# Patient Record
Sex: Female | Born: 1955
Health system: Southern US, Community
[De-identification: ages and names within clinical notes are randomized; demographics above are authoritative.]

## PROBLEM LIST (undated history)

## (undated) DIAGNOSIS — Z923 Personal history of irradiation: Secondary | ICD-10-CM

## (undated) DIAGNOSIS — C439 Malignant melanoma of skin, unspecified: Secondary | ICD-10-CM

## (undated) DIAGNOSIS — R519 Headache, unspecified: Secondary | ICD-10-CM

## (undated) DIAGNOSIS — Z8041 Family history of malignant neoplasm of ovary: Secondary | ICD-10-CM

## (undated) DIAGNOSIS — C50919 Malignant neoplasm of unspecified site of unspecified female breast: Secondary | ICD-10-CM

## (undated) DIAGNOSIS — Z803 Family history of malignant neoplasm of breast: Secondary | ICD-10-CM

## (undated) DIAGNOSIS — J45909 Unspecified asthma, uncomplicated: Secondary | ICD-10-CM

## (undated) DIAGNOSIS — R51 Headache: Secondary | ICD-10-CM

## (undated) HISTORY — DX: Family history of malignant neoplasm of breast: Z80.3

## (undated) HISTORY — DX: Malignant melanoma of skin, unspecified: C43.9

## (undated) HISTORY — PX: DILATION AND CURETTAGE OF UTERUS: SHX78

## (undated) HISTORY — DX: Family history of malignant neoplasm of ovary: Z80.41

## (undated) HISTORY — DX: Age-related osteoporosis without current pathological fracture: M81.0

---
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
## 1994-04-05 HISTORY — PX: ECTOPIC PREGNANCY SURGERY: SHX613

## 1997-05-06 ENCOUNTER — Encounter: Admission: RE | Admit: 1997-05-06 | Discharge: 1997-08-04 | Payer: Self-pay | Admitting: Obstetrics and Gynecology

## 1997-12-11 ENCOUNTER — Other Ambulatory Visit: Admission: RE | Admit: 1997-12-11 | Discharge: 1997-12-11 | Payer: Self-pay | Admitting: Obstetrics and Gynecology

## 1998-12-12 ENCOUNTER — Other Ambulatory Visit: Admission: RE | Admit: 1998-12-12 | Discharge: 1998-12-12 | Payer: Self-pay | Admitting: Obstetrics and Gynecology

## 2000-02-12 ENCOUNTER — Other Ambulatory Visit: Admission: RE | Admit: 2000-02-12 | Discharge: 2000-02-12 | Payer: Self-pay | Admitting: Obstetrics and Gynecology

## 2000-02-12 ENCOUNTER — Encounter: Admission: RE | Admit: 2000-02-12 | Discharge: 2000-02-12 | Payer: Self-pay | Admitting: *Deleted

## 2000-02-12 ENCOUNTER — Encounter: Payer: Self-pay | Admitting: *Deleted

## 2000-09-23 ENCOUNTER — Encounter: Payer: Self-pay | Admitting: *Deleted

## 2000-09-23 ENCOUNTER — Encounter: Admission: RE | Admit: 2000-09-23 | Discharge: 2000-09-23 | Payer: Self-pay | Admitting: *Deleted

## 2001-04-03 ENCOUNTER — Other Ambulatory Visit: Admission: RE | Admit: 2001-04-03 | Discharge: 2001-04-03 | Payer: Self-pay | Admitting: Obstetrics and Gynecology

## 2002-04-25 ENCOUNTER — Other Ambulatory Visit: Admission: RE | Admit: 2002-04-25 | Discharge: 2002-04-25 | Payer: Self-pay | Admitting: Obstetrics and Gynecology

## 2002-09-27 ENCOUNTER — Encounter: Payer: Self-pay | Admitting: Internal Medicine

## 2002-09-27 ENCOUNTER — Ambulatory Visit (HOSPITAL_COMMUNITY): Admission: RE | Admit: 2002-09-27 | Discharge: 2002-09-27 | Payer: Self-pay | Admitting: Internal Medicine

## 2002-10-01 ENCOUNTER — Encounter: Admission: RE | Admit: 2002-10-01 | Discharge: 2002-10-01 | Payer: Self-pay | Admitting: Internal Medicine

## 2002-10-01 ENCOUNTER — Encounter: Payer: Self-pay | Admitting: Internal Medicine

## 2002-11-27 ENCOUNTER — Encounter: Payer: Self-pay | Admitting: Obstetrics and Gynecology

## 2002-11-27 ENCOUNTER — Encounter: Admission: RE | Admit: 2002-11-27 | Discharge: 2002-11-27 | Payer: Self-pay | Admitting: Obstetrics and Gynecology

## 2003-02-01 ENCOUNTER — Encounter: Admission: RE | Admit: 2003-02-01 | Discharge: 2003-02-01 | Payer: Self-pay | Admitting: Internal Medicine

## 2003-03-13 ENCOUNTER — Encounter (HOSPITAL_COMMUNITY): Admission: RE | Admit: 2003-03-13 | Discharge: 2003-06-11 | Payer: Self-pay | Admitting: General Surgery

## 2003-05-03 ENCOUNTER — Other Ambulatory Visit: Admission: RE | Admit: 2003-05-03 | Discharge: 2003-05-03 | Payer: Self-pay | Admitting: Obstetrics and Gynecology

## 2003-12-11 ENCOUNTER — Encounter: Admission: RE | Admit: 2003-12-11 | Discharge: 2003-12-11 | Payer: Self-pay | Admitting: Obstetrics and Gynecology

## 2004-05-04 ENCOUNTER — Other Ambulatory Visit: Admission: RE | Admit: 2004-05-04 | Discharge: 2004-05-04 | Payer: Self-pay | Admitting: Obstetrics and Gynecology

## 2005-01-13 ENCOUNTER — Encounter: Admission: RE | Admit: 2005-01-13 | Discharge: 2005-01-13 | Payer: Self-pay | Admitting: Obstetrics and Gynecology

## 2005-01-27 ENCOUNTER — Emergency Department (HOSPITAL_COMMUNITY): Admission: EM | Admit: 2005-01-27 | Discharge: 2005-01-28 | Payer: Self-pay | Admitting: Emergency Medicine

## 2005-05-19 ENCOUNTER — Other Ambulatory Visit: Admission: RE | Admit: 2005-05-19 | Discharge: 2005-05-19 | Payer: Self-pay | Admitting: Obstetrics and Gynecology

## 2006-03-11 ENCOUNTER — Encounter: Admission: RE | Admit: 2006-03-11 | Discharge: 2006-03-11 | Payer: Self-pay | Admitting: Obstetrics and Gynecology

## 2006-03-23 ENCOUNTER — Encounter: Admission: RE | Admit: 2006-03-23 | Discharge: 2006-03-23 | Payer: Self-pay | Admitting: Obstetrics and Gynecology

## 2007-04-17 ENCOUNTER — Encounter: Admission: RE | Admit: 2007-04-17 | Discharge: 2007-04-17 | Payer: Self-pay | Admitting: Obstetrics and Gynecology

## 2008-04-18 ENCOUNTER — Encounter: Admission: RE | Admit: 2008-04-18 | Discharge: 2008-04-18 | Payer: Self-pay | Admitting: Obstetrics and Gynecology

## 2009-07-08 ENCOUNTER — Ambulatory Visit: Payer: Self-pay | Admitting: Genetic Counselor

## 2010-04-26 ENCOUNTER — Encounter: Payer: Self-pay | Admitting: Obstetrics and Gynecology

## 2011-05-14 ENCOUNTER — Encounter (HOSPITAL_COMMUNITY): Payer: Self-pay

## 2011-05-14 ENCOUNTER — Emergency Department (INDEPENDENT_AMBULATORY_CARE_PROVIDER_SITE_OTHER): Payer: PRIVATE HEALTH INSURANCE

## 2011-05-14 ENCOUNTER — Emergency Department (HOSPITAL_COMMUNITY)
Admission: EM | Admit: 2011-05-14 | Discharge: 2011-05-14 | Disposition: A | Discharge status: Home / Self Care | Payer: PRIVATE HEALTH INSURANCE | Source: Home / Self Care | Attending: Emergency Medicine | Admitting: Emergency Medicine

## 2011-05-14 DIAGNOSIS — S6000XA Contusion of unspecified finger without damage to nail, initial encounter: Secondary | ICD-10-CM

## 2011-05-14 MED ORDER — HYDROCODONE-ACETAMINOPHEN 5-500 MG PO TABS: 1.0000 | ORAL_TABLET | Freq: Four times a day (QID) | ORAL | Status: AC | PRN

## 2011-05-14 MED ORDER — MELOXICAM 7.5 MG PO TABS: 7.5000 mg | ORAL_TABLET | Freq: Every day | ORAL | Status: AC

## 2011-05-14 NOTE — ED Provider Notes (Signed)
History     CSN: [id number]  Arrival date & time 05/14/11  4098   First MD Initiated Contact with Patient 05/14/11 1059      Chief Complaint  Patient presents with  . Hand Injury    (Consider location/radiation/quality/duration/timing/severity/associated sxs/prior treatment) HPI Comments: Julie Mack presented today to the urgent care Center complaining of right fifth finger pain more localized pain to the fifth digit PIP. Describes that she fell about 2 and half weeks ago hitting her right hand on a piece of furniture and has been expressing pain since then with swelling of her right fifth finger. Described the pain has continued but swelling has come down and was much worse.  She denies any paresthesias or numbness and she's able to move her finger "all right" but it hurts when I do so". Patient have not had any medical diagnostic workup for this recent fall.  Denies any other symptoms.  Patient is a 56 y.o. female presenting with hand injury. The history is provided by the patient.  Hand Injury  The incident occurred more than 1 week ago. The injury mechanism was a fall. Pertinent negatives include no fever.    History reviewed. No pertinent past medical history.  Past Surgical History  Procedure Date  . Tonsillectomy   . Ectopic pregnancy surgery     No family history on file.  History  Substance Use Topics  . Smoking status: Never Smoker   . Smokeless tobacco: Not on file  . Alcohol Use: Yes    OB History    Grav Para Term Preterm Abortions TAB SAB Ect Mult Living                  Review of Systems  Constitutional: Negative for fever.  Skin: Negative for color change and wound.  Neurological: Negative for weakness and numbness.    Allergies  Review of patient's allergies indicates no known allergies.  Home Medications   Current Outpatient Rx  Name Route Sig Dispense Refill  . NORGESTIMATE-ETH ESTRADIOL 0.25-35 MG-MCG PO TABS Oral Take 1 tablet by mouth  daily.    Marland Kitchen HYDROCODONE-ACETAMINOPHEN 5-500 MG PO TABS Oral Take 1-2 tablets by mouth every 6 (six) hours as needed for pain. 15 tablet 0  . MELOXICAM 7.5 MG PO TABS Oral Take 1 tablet (7.5 mg total) by mouth daily. 14 tablet 0    BP 119/77  Pulse 75  Temp(Src) 98.4 F (36.9 C) (Oral)  Resp 18  SpO2 99%  LMP 05/04/2011  Physical Exam  Constitutional: She appears well-developed and well-nourished. No distress.  HENT:  Head: Atraumatic.  Eyes: Right eye exhibits no discharge. Left eye exhibits no discharge. No scleral icterus.  Musculoskeletal: She exhibits tenderness. She exhibits no edema.       Right hand: She exhibits tenderness, bony tenderness and swelling. She exhibits normal range of motion, normal two-point discrimination, normal capillary refill and no deformity. normal sensation noted. Normal strength noted. She exhibits no finger abduction, no thumb/finger opposition and no wrist extension trouble.       Hands: Neurological: She is alert.  Skin: No rash noted. No erythema.    ED Course  Procedures (including critical care time)  Labs Reviewed - No data to display Dg Hand Complete Right  05/14/2011  *RADIOLOGY REPORT*  Clinical Data: Fall 2 weeks prior  RIGHT HAND - COMPLETE 3+ VIEW  Comparison: None.  Findings: No evidence of fracture of the carpals or metacarpals. Radiocarpal joint is intact.  IMPRESSION:  No evidence of hand fracture.  Original Report Authenticated By: Genevive Bi, M.D.     1. Contusion of finger       MDM  Range of motion and sensorially vascular is her x-rays were negative for dislocations fractures or localize infection.        Julie Molly, MD 05/14/11 1322

## 2011-05-14 NOTE — ED Notes (Signed)
Pt states she fell 2 1/2 weeks ago and hit her rt hand on a piece of furniture.  conitnues to have pain and swelling to rt 5th finger.  Full ROM intact.

## 2013-04-05 HISTORY — PX: ANKLE FRACTURE SURGERY: SHX122

## 2014-10-15 ENCOUNTER — Encounter: Payer: Self-pay | Admitting: Genetic Counselor

## 2014-12-26 ENCOUNTER — Other Ambulatory Visit: Payer: Self-pay | Admitting: Internal Medicine

## 2014-12-26 ENCOUNTER — Ambulatory Visit
Admission: RE | Admit: 2014-12-26 | Discharge: 2014-12-26 | Disposition: A | Discharge status: Home / Self Care | Payer: PRIVATE HEALTH INSURANCE | Source: Ambulatory Visit | Attending: Internal Medicine | Admitting: Internal Medicine

## 2014-12-26 DIAGNOSIS — G500 Trigeminal neuralgia: Secondary | ICD-10-CM

## 2014-12-26 MED ORDER — GADOBENATE DIMEGLUMINE 529 MG/ML IV SOLN
15.0000 mL | Freq: Once | INTRAVENOUS | Status: AC | PRN
  Administered 2014-12-26: 15 mL via INTRAVENOUS

## 2015-01-07 ENCOUNTER — Encounter: Payer: Self-pay | Admitting: *Deleted

## 2015-01-08 ENCOUNTER — Encounter: Payer: Self-pay | Admitting: Diagnostic Neuroimaging

## 2015-01-08 ENCOUNTER — Ambulatory Visit (INDEPENDENT_AMBULATORY_CARE_PROVIDER_SITE_OTHER): Payer: PRIVATE HEALTH INSURANCE | Admitting: Diagnostic Neuroimaging

## 2015-01-08 VITALS — BP 115/64 | HR 82 | Ht 64.0 in | Wt 183.0 lb

## 2015-01-08 DIAGNOSIS — G521 Disorders of glossopharyngeal nerve: Secondary | ICD-10-CM

## 2015-01-08 DIAGNOSIS — G500 Trigeminal neuralgia: Secondary | ICD-10-CM

## 2015-01-08 NOTE — Progress Notes (Signed)
[facility]  PATIENT: Julie Mack DOB: [birth year]  REFERRING CLINICIAN: Brigitte Pulse  HISTORY FROM: patient  REASON FOR VISIT: new consult    HISTORICAL  CHIEF COMPLAINT:  Chief Complaint  Patient presents with  . Trigeminal neuralgia, possible    rm 6, New Patient    HISTORY OF PRESENT ILLNESS:   59 year old right-handed female here for evaluation of possible trigeminal neuralgia.  Patient has long history of intermittent brief "pink" sensations in her right parietal and right facial region. This started sometime around college, sometimes associated with cold or sinus infections. This was a occurring every 1-2 years and she did not seek medical attention for these attacks. Over the years symptoms would occur sporadically without specific triggering factors. Over past 4 years patient has had 3-4 serious attacks with severe pain, lasting 3-4 seconds at a time. Symptoms would occur every 20 minutes, increasing frequency up to 2-3 minutes between attacks. Overall attack would last 4-7 days. With more recent attacks patient reported increasing pain in her throat, jaw, ear.  Interestingly patient was prescribed valacyclovir for the last 2 attacks and within 24 hours of taking medication the symptoms seem to resolve. Valacyclovir was started for empiric treatment of shingles, even though patient never had vesicular rash or other outbreaks.   Patient denies any history of traumas, serious infections, or major dental procedures or complications.   REVIEW OF SYSTEMS: Full 14 system review of systems performed and notable only for only as per history of present illness. No headaches.   ALLERGIES: Allergies  Allergen Reactions  . Gluten Meal Nausea Only    HOME MEDICATIONS: Outpatient Prescriptions Prior to Visit  Medication Sig Dispense Refill  . norgestimate-ethinyl estradiol (SPRINTEC 28) 0.25-35 MG-MCG tablet Take 1 tablet by mouth daily.     No  facility-administered medications prior to visit.    PAST MEDICAL HISTORY: Past Medical History  Diagnosis Date  . Osteoporosis   . Melanoma ([place])     in situ    PAST SURGICAL HISTORY: Past Surgical History  Procedure Laterality Date  . Tonsillectomy  1966  . Ectopic pregnancy surgery  1996  . Eye surgery  1967    cyst removed  . Ankle fracture surgery Right 2015    FAMILY HISTORY: Family History  Problem Relation Age of Onset  . Heart attack Mother   . Cancer - Lung Father   . Cancer Sister     breast  . Cancer Maternal Grandmother     ovarian  . Stroke Maternal Grandfather   . Stroke Paternal Grandfather     SOCIAL HISTORY:  Social History   Social History  . Marital Status: Married    Spouse Name: Simona Huh  . Number of Children: 2  . Years of Education: 16   Occupational History  .      [place], [place]   Social History Main Topics  . Smoking status: Never Smoker   . Smokeless tobacco: Not on file  . Alcohol Use: 0.0 oz/week    0 Standard drinks or equivalent per week     Comment: 2 glasses wine a day  . Drug Use: No  . Sexual Activity: Not on file   Other Topics Concern  . Not on file   Social History Narrative   Lives at home with husband   decaff only     PHYSICAL EXAM  GENERAL EXAM/CONSTITUTIONAL: Vitals:  Filed Vitals:   01/08/15 0839  BP: 115/64  Pulse: 82  Height: 5\' 4"  (1.626 m)  Weight: 183 lb (83.008 kg)     Body mass index is 31.4 kg/(m^2).  Visual Acuity Screening   Right eye Left eye Both eyes  Without correction:     With correction: 20/30 20/30      Patient is in no distress; well developed, nourished and groomed; neck is supple  CARDIOVASCULAR:  Examination of carotid arteries is normal; no carotid bruits  Regular rate and rhythm, no murmurs  Examination of peripheral vascular system by observation and palpation is normal  EYES:  Ophthalmoscopic exam of optic discs and posterior  segments is normal; no papilledema or hemorrhages  MUSCULOSKELETAL:  Gait, strength, tone, movements noted in Neurologic exam below  NEUROLOGIC: MENTAL STATUS:  No flowsheet data found.  awake, alert, oriented to person, place and time  recent and remote memory intact  normal attention and concentration  language fluent, comprehension intact, naming intact,   fund of knowledge appropriate  CRANIAL NERVE:   2nd - no papilledema on fundoscopic exam  2nd, 3rd, 4th, 6th - pupils equal and reactive to light, visual fields full to confrontation, extraocular muscles intact, no nystagmus  5th - facial sensation symmetric  7th - facial strength symmetric  8th - hearing intact  9th - palate elevates symmetrically, uvula midline  11th - shoulder shrug symmetric  12th - tongue protrusion midline  MOTOR:   normal bulk and tone, full strength in the BUE, BLE  SENSORY:   normal and symmetric to light touch, temperature, vibration  COORDINATION:   finger-nose-finger, fine finger movements normal  REFLEXES:   deep tendon reflexes present and symmetric  GAIT/STATION:   narrow based gait; able to walk tandem; romberg is negative    DIAGNOSTIC DATA (LABS, IMAGING, TESTING) - I reviewed patient records, labs, notes, testing and imaging myself where available.  No results found for: WBC, HGB, HCT, MCV, PLT No results found for: NA, K, CL, CO2, GLUCOSE, BUN, CREATININE, CALCIUM, PROT, ALBUMIN, AST, ALT, ALKPHOS, BILITOT, GFRNONAA, GFRAA No results found for: CHOL, HDL, LDLCALC, LDLDIRECT, TRIG, CHOLHDL No results found for: HGBA1C No results found for: VITAMINB12 No results found for: TSH   12/26/14 MRI brain [I reviewed images myself and agree with interpretation. -VRP]  - Essentially normal MRI appearance of the brain; there is a 2.3 cm arachnoid cyst in the right middle cranial fossa but significance is doubtful. - No brainstem or trigeminal abnormality is  identified.    ASSESSMENT AND PLAN  59 y.o. year old female here with intermittent episodes, paroxysmal attacks of pain in the right parietal, right facial, right auricular and right pharyngeal regions. Findings suspicious for trigeminal and glossopharyngeal neuralgias. Neurologic examination and MRI are unremarkable. Most likely represents idiopathic versus viral/inflammatory etiologies. If symptoms increase in severity or frequency may consider medical treatment with carbamazepine or gabapentin. For now we will monitor symptoms. Patient's questions were addressed. She will follow-up with me as needed or sooner if symptoms recur.   Dx: (idiopathic vs viral/inflammatory)  Trigeminal neuralgia of right side of face  Glossopharyngeal neuralgia    PLAN: - monitor symptoms  Return if symptoms worsen or fail to improve.    Penni Bombard, MD 40/12/8117, 1:47 AM Certified in Neurology, Neurophysiology and Neuroimaging  [facility] [address], [place], [place] [address] [phone]

## 2015-01-08 NOTE — Patient Instructions (Signed)
Thank you for coming to see [place] at [facility]. I hope we have been able to provide you high quality care today.  You may receive a patient satisfaction survey over the next few weeks. We would appreciate your feedback and comments so that we may continue to improve ourselves and the health of our patients.  - monitor symptoms   ~~~~~~~~~~~~~~~~~~~~~~~~~~~~~~~~~~~~~~~~~~~~~~~~~~~~~~~~~~~~~~~~~  DR. PENUMALLI'S GUIDE TO HAPPY AND HEALTHY LIVING These are some of my general health and wellness recommendations. Some of them may apply to you better than others. Please use common sense as you try these suggestions and feel free to ask me any questions.   ACTIVITY/FITNESS Mental, social, emotional and physical stimulation are very important for brain and body health. Try learning a new activity (arts, music, language, sports, games).  Keep moving your body to the best of your abilities. You can do this at home, inside or outside, the park, community center, gym or anywhere you like. Consider a physical therapist or personal trainer to get started. Consider the app Sworkit. Fitness trackers such as smart-watches, smart-phones or Fitbits can help as well.   NUTRITION Eat more plants: colorful vegetables, nuts, seeds and berries.  Eat less sugar, salt, preservatives and processed foods.  Avoid toxins such as cigarettes and alcohol.  Drink water when you are thirsty. Warm water with a slice of lemon is an excellent morning drink to start the day.  Consider these websites for more information [employer] Nutrition [employer] (https://www.henry-hernandez.biz/) [employer] Nutrition (WindowBlog.ch)   RELAXATION Consider practicing mindfulness meditation or other relaxation techniques such as deep breathing, prayer, yoga, tai chi, massage. See website mindful.org or the apps Headspace or Calm to help get started.   SLEEP Try to get at least 7-8+  hours sleep per day. Regular exercise and reduced caffeine will help you sleep better. Practice good sleep hygeine techniques. See website sleep.org for more information.

## 2015-04-06 DIAGNOSIS — Z923 Personal history of irradiation: Secondary | ICD-10-CM

## 2015-04-06 HISTORY — DX: Personal history of irradiation: Z92.3

## 2015-12-19 ENCOUNTER — Other Ambulatory Visit: Payer: Self-pay | Admitting: Obstetrics and Gynecology

## 2015-12-19 DIAGNOSIS — Z9189 Other specified personal risk factors, not elsewhere classified: Secondary | ICD-10-CM

## 2015-12-28 ENCOUNTER — Ambulatory Visit
Admission: RE | Admit: 2015-12-28 | Discharge: 2015-12-28 | Disposition: A | Discharge status: Home / Self Care | Payer: PRIVATE HEALTH INSURANCE | Source: Ambulatory Visit | Attending: Obstetrics and Gynecology | Admitting: Obstetrics and Gynecology

## 2015-12-28 DIAGNOSIS — Z9189 Other specified personal risk factors, not elsewhere classified: Secondary | ICD-10-CM

## 2015-12-28 MED ORDER — GADOBENATE DIMEGLUMINE 529 MG/ML IV SOLN
15.0000 mL | Freq: Once | INTRAVENOUS | Status: AC | PRN
  Administered 2015-12-28: 15 mL via INTRAVENOUS

## 2015-12-31 ENCOUNTER — Other Ambulatory Visit: Payer: Self-pay | Admitting: Obstetrics and Gynecology

## 2015-12-31 DIAGNOSIS — R9389 Abnormal findings on diagnostic imaging of other specified body structures: Secondary | ICD-10-CM

## 2016-01-04 DIAGNOSIS — C50919 Malignant neoplasm of unspecified site of unspecified female breast: Secondary | ICD-10-CM

## 2016-01-04 HISTORY — DX: Malignant neoplasm of unspecified site of unspecified female breast: C50.919

## 2016-01-05 ENCOUNTER — Ambulatory Visit
Admission: RE | Admit: 2016-01-05 | Discharge: 2016-01-05 | Disposition: A | Discharge status: Home / Self Care | Payer: PRIVATE HEALTH INSURANCE | Source: Ambulatory Visit | Attending: Obstetrics and Gynecology | Admitting: Obstetrics and Gynecology

## 2016-01-05 ENCOUNTER — Other Ambulatory Visit: Payer: Self-pay | Admitting: Obstetrics and Gynecology

## 2016-01-05 DIAGNOSIS — R9389 Abnormal findings on diagnostic imaging of other specified body structures: Secondary | ICD-10-CM

## 2016-01-05 DIAGNOSIS — C50919 Malignant neoplasm of unspecified site of unspecified female breast: Secondary | ICD-10-CM | POA: Insufficient documentation

## 2016-01-05 MED ORDER — GADOBENATE DIMEGLUMINE 529 MG/ML IV SOLN
14.0000 mL | Freq: Once | INTRAVENOUS | Status: AC | PRN
  Administered 2016-01-05: 14 mL via INTRAVENOUS

## 2016-01-14 ENCOUNTER — Encounter: Payer: Self-pay | Admitting: Genetic Counselor

## 2016-01-15 ENCOUNTER — Other Ambulatory Visit: Payer: Self-pay | Admitting: General Surgery

## 2016-01-15 DIAGNOSIS — C50411 Malignant neoplasm of upper-outer quadrant of right female breast: Secondary | ICD-10-CM

## 2016-01-15 DIAGNOSIS — Z170 Estrogen receptor positive status [ER+]: Principal | ICD-10-CM

## 2016-01-16 ENCOUNTER — Telehealth: Payer: Self-pay | Admitting: Oncology

## 2016-01-16 ENCOUNTER — Encounter: Payer: Self-pay | Admitting: Genetic Counselor

## 2016-01-16 ENCOUNTER — Encounter: Payer: Self-pay | Admitting: Oncology

## 2016-01-16 NOTE — Telephone Encounter (Signed)
Pt returned my call to schedule appt w/Julie Mack. Appt scheduled for 10/23 at 430pm. Pt aware to arrive 30 minutes early. Demographics verified. Letter mailed to the patient.

## 2016-01-23 ENCOUNTER — Other Ambulatory Visit: Payer: Self-pay | Admitting: *Deleted

## 2016-01-23 ENCOUNTER — Other Ambulatory Visit: Payer: Self-pay | Admitting: General Surgery

## 2016-01-23 DIAGNOSIS — Z170 Estrogen receptor positive status [ER+]: Principal | ICD-10-CM

## 2016-01-23 DIAGNOSIS — C50411 Malignant neoplasm of upper-outer quadrant of right female breast: Secondary | ICD-10-CM

## 2016-01-23 DIAGNOSIS — C50919 Malignant neoplasm of unspecified site of unspecified female breast: Secondary | ICD-10-CM

## 2016-01-26 ENCOUNTER — Other Ambulatory Visit (HOSPITAL_BASED_OUTPATIENT_CLINIC_OR_DEPARTMENT_OTHER): Payer: PRIVATE HEALTH INSURANCE

## 2016-01-26 ENCOUNTER — Ambulatory Visit (HOSPITAL_BASED_OUTPATIENT_CLINIC_OR_DEPARTMENT_OTHER): Payer: PRIVATE HEALTH INSURANCE | Admitting: Oncology

## 2016-01-26 DIAGNOSIS — C50411 Malignant neoplasm of upper-outer quadrant of right female breast: Secondary | ICD-10-CM | POA: Diagnosis not present

## 2016-01-26 DIAGNOSIS — C50919 Malignant neoplasm of unspecified site of unspecified female breast: Secondary | ICD-10-CM

## 2016-01-26 DIAGNOSIS — Z170 Estrogen receptor positive status [ER+]: Secondary | ICD-10-CM | POA: Diagnosis not present

## 2016-01-26 DIAGNOSIS — Z801 Family history of malignant neoplasm of trachea, bronchus and lung: Secondary | ICD-10-CM | POA: Diagnosis not present

## 2016-01-26 DIAGNOSIS — Z803 Family history of malignant neoplasm of breast: Secondary | ICD-10-CM | POA: Diagnosis not present

## 2016-01-26 DIAGNOSIS — Z8041 Family history of malignant neoplasm of ovary: Secondary | ICD-10-CM

## 2016-01-26 LAB — CBC WITH DIFFERENTIAL/PLATELET
BASO%: 0.8 % (ref 0.0–2.0)
Basophils Absolute: 0.1 10*3/uL (ref 0.0–0.1)
EOS ABS: 0.2 10*3/uL (ref 0.0–0.5)
EOS%: 3.2 % (ref 0.0–7.0)
HEMATOCRIT: 41.5 % (ref 34.8–46.6)
HEMOGLOBIN: 13.7 g/dL (ref 11.6–15.9)
LYMPH#: 1.7 10*3/uL (ref 0.9–3.3)
LYMPH%: 23.6 % (ref 14.0–49.7)
MCH: 29.3 pg (ref 25.1–34.0)
MCHC: 33.1 g/dL (ref 31.5–36.0)
MCV: 88.5 fL (ref 79.5–101.0)
MONO#: 0.6 10*3/uL (ref 0.1–0.9)
MONO%: 8.3 % (ref 0.0–14.0)
NEUT%: 64.1 % (ref 38.4–76.8)
NEUTROS ABS: 4.5 10*3/uL (ref 1.5–6.5)
Platelets: 257 10*3/uL (ref 145–400)
RBC: 4.69 10*6/uL (ref 3.70–5.45)
RDW: 12.8 % (ref 11.2–14.5)
WBC: 7 10*3/uL (ref 3.9–10.3)

## 2016-01-26 LAB — COMPREHENSIVE METABOLIC PANEL
ALBUMIN: 3.9 g/dL (ref 3.5–5.0)
ALK PHOS: 94 U/L (ref 40–150)
ALT: 16 U/L (ref 0–55)
AST: 16 U/L (ref 5–34)
Anion Gap: 9 mEq/L (ref 3–11)
BILIRUBIN TOTAL: 0.36 mg/dL (ref 0.20–1.20)
BUN: 13.7 mg/dL (ref 7.0–26.0)
CO2: 28 mEq/L (ref 22–29)
Calcium: 9.8 mg/dL (ref 8.4–10.4)
Chloride: 104 mEq/L (ref 98–109)
Creatinine: 0.7 mg/dL (ref 0.6–1.1)
GLUCOSE: 106 mg/dL (ref 70–140)
Potassium: 4.7 mEq/L (ref 3.5–5.1)
SODIUM: 141 meq/L (ref 136–145)
TOTAL PROTEIN: 7.6 g/dL (ref 6.4–8.3)

## 2016-01-26 MED ORDER — TAMOXIFEN CITRATE 20 MG PO TABS: 20.0000 mg | ORAL_TABLET | Freq: Every day | ORAL | 12 refills | Status: AC

## 2016-01-26 NOTE — Progress Notes (Signed)
[place]  [phone] [phone]     ID: Julie Mack DOB: [birth year]  MR#: [id number]  DGL#:[id number]  Patient Care Team: Marton Redwood, MD as PCP - General (Internal Medicine) Fanny Skates, MD as Consulting Physician (General Surgery) Eppie Gibson, MD as Attending Physician (Radiation Oncology) Rolm Bookbinder, MD as Consulting Physician (Dermatology) Ronald Lobo, MD as Consulting Physician (Gastroenterology) Chauncey Cruel, MD OTHER MD:  CHIEF COMPLAINT: Estrogen receptor positive breast cancer  CURRENT TREATMENT: Awaiting definitive surgery   BREAST CANCER HISTORY: Julie Mack undergoes high risk breast cancer screening because of a significant family history and on 12/28/2015 bilateral breast MRI found a 6 mm enhancing mass in the upper outer quadrant of the right breast there were no other abnormalities including no left breast findings and no adenopathy. Biopsy of the right breast mass in question documented invasive ductal carcinoma, grade 1, estrogen receptor 100% positive, with strong staining intensity, progesterone receptor 30% positive, with moderate staining intensity, with an MIB-1 of 10%, and no HER-2 amplification, the signals ratio being 0.72 and the number per cell 1.15. A second suspicious area also in the upper-outer quadrant was biopsied at the same time and showed only usual ductal hyperplasia.  Her subsequent history is as detailed below.   INTERVAL HISTORY: Julie Mack was evaluated in the breast clinic 01/26/2016 accompanied by her husband Julie Mack. Her case was also presented in the multidisciplinary breast cancer conference 01/14/2016. At that time a preliminary plan was proposed: Genetics testing, breast conserving surgery with sentinel lymph node sampling, and consideration of Oncotype  REVIEW OF SYSTEMS: There were no specific symptoms leading to the original mammogram, which was routinely scheduled. The patient denies  unusual headaches, visual changes, nausea, vomiting, stiff neck, dizziness, or gait imbalance. There has been no cough, phlegm production, or pleurisy, no chest pain or pressure, and no change in bowel or bladder habits. The patient denies fever, rash, bleeding, unexplained fatigue or unexplained weight loss. [name] chiefly by hiking although she does have some plantar fasciitis problems. A detailed review of systems was otherwise entirely negative.   PAST MEDICAL HISTORY: Past Medical History:  Diagnosis Date  . Melanoma ([place])    in situ  . Osteoporosis     PAST SURGICAL HISTORY: Past Surgical History:  Procedure Laterality Date  . ANKLE FRACTURE SURGERY Right 2015  . [place]  . EYE SURGERY  1967   cyst removed  . TONSILLECTOMY  1966    FAMILY HISTORY Family History  Problem Relation Age of Onset  . Heart attack Mother   . Cancer - Lung Father   . Cancer Sister     breast  . Cancer Maternal Grandmother     ovarian  . Stroke Maternal Grandfather   . Stroke Paternal Grandfather   As of October 2017 Julie Mack's parents are still living, in their early 72s. Her father had lung cancer diagnosed at age 23 (essentially a nonsmoker); a maternal grandmother had ovarian cancer at age 75 the patient has 2 brothers, 2 sisters, with one sister diagnosed with breast cancer at age 83("and doing fine").  GYNECOLOGIC HISTORY:  Patient's last menstrual period was 05/04/2011. Energy age 26, first live birth age 67, menopause age 47, on vaginal estrogen cream until October 2017. She used oral contraceptives for more than 30 years with no complications  SOCIAL HISTORY:  Julie Mack and hotel executives. Their son Julie Mack is Agricultural consultant in [place]. Their daughter Julie Mack is interested  in literature in [place].    ADVANCED DIRECTIVES: In place   HEALTH MAINTENANCE: Social History  Substance Use Topics  . Smoking status: Never Smoker    . Smokeless tobacco: Not on file  . Alcohol use 0.0 oz/week     Comment: 2 glasses wine a day     Colonoscopy:  PAP:  Bone density:   Allergies  Allergen Reactions  . Gluten Meal Nausea Only    Current Outpatient Prescriptions  Medication Sig Dispense Refill  . betamethasone valerate ointment (VALISONE) 0.1 %     . fluorouracil (EFUDEX) 5 % cream Apply topically as needed.    . Multiple Vitamins-Minerals (MULTI COMPLETE PO) Take by mouth.    . valACYclovir (VALTREX) 1000 MG tablet Take 1,000 mg by mouth 2 (two) times daily. 01/08/15 as needed for pain    . tamoxifen (NOLVADEX) 20 MG tablet Take 1 tablet (20 mg total) by mouth daily. 90 tablet 12   No current facility-administered medications for this visit.     OBJECTIVE: Middle-aged white woman in no acute distress Vitals:   01/26/16 1609  BP: 127/73  Pulse: 73  Resp: 18  Temp: 97.4 F (36.3 C)     Body mass index is 27.6 kg/m.    ECOG FS:0 - Asymptomatic  Ocular: Sclerae unicteric, pupils equal, round and reactive to light Ear-nose-throat: Oropharynx clear and moist Lymphatic: No cervical or supraclavicular adenopathy Lungs no rales or rhonchi, good excursion bilaterally Heart regular rate and rhythm, no murmur appreciated Abd soft, nontender, positive bowel sounds MSK no focal spinal tenderness, no joint edema Neuro: non-focal, well-oriented, appropriate affect Breasts: The right breast is status post recent biopsy. There is a small ecchymosis. There are no skin or nipple changes of concern. The right axilla is benign. The left breast is unremarkable   LAB RESULTS:  CMP     Component Value Date/Time   NA 141 01/26/2016 1550   K 4.7 01/26/2016 1550   CO2 28 01/26/2016 1550   GLUCOSE 106 01/26/2016 1550   BUN 13.7 01/26/2016 1550   CREATININE 0.7 01/26/2016 1550   CALCIUM 9.8 01/26/2016 1550   PROT 7.6 01/26/2016 1550   ALBUMIN 3.9 01/26/2016 1550   AST 16 01/26/2016 1550   ALT 16 01/26/2016 1550    ALKPHOS 94 01/26/2016 1550   BILITOT 0.36 01/26/2016 1550    INo results found for: SPEP, UPEP  Lab Results  Component Value Date   WBC 7.0 01/26/2016   NEUTROABS 4.5 01/26/2016   HGB 13.7 01/26/2016   HCT 41.5 01/26/2016   MCV 88.5 01/26/2016   PLT 257 01/26/2016      Chemistry      Component Value Date/Time   NA 141 01/26/2016 1550   K 4.7 01/26/2016 1550   CO2 28 01/26/2016 1550   BUN 13.7 01/26/2016 1550   CREATININE 0.7 01/26/2016 1550      Component Value Date/Time   CALCIUM 9.8 01/26/2016 1550   ALKPHOS 94 01/26/2016 1550   AST 16 01/26/2016 1550   ALT 16 01/26/2016 1550   BILITOT 0.36 01/26/2016 1550       No results found for: LABCA2  No components found for: LABCA125  No results for input(s): INR in the last 168 hours.  Urinalysis No results found for: COLORURINE, APPEARANCEUR, LABSPEC, PHURINE, GLUCOSEU, HGBUR, BILIRUBINUR, KETONESUR, PROTEINUR, UROBILINOGEN, NITRITE, LEUKOCYTESUR   STUDIES: Mr Breast Bilateral W Wo Contrast  Result Date: 12/29/2015 CLINICAL DATA:  High risk screening MRI. The patient  has a sister diagnosed with breast cancer at the age of 42. LABS:  Not applicable. EXAM: BILATERAL BREAST MRI WITH AND WITHOUT CONTRAST TECHNIQUE: Multiplanar, multisequence MR images of both breasts were obtained prior to and following the intravenous administration of 15 ml of MultiHance. THREE-DIMENSIONAL MR IMAGE RENDERING ON INDEPENDENT WORKSTATION: Three-dimensional MR images were rendered by post-processing of the original MR data on an independent workstation. The three-dimensional MR images were interpreted, and findings are reported in the following complete MRI report for this study. Three dimensional images were evaluated at the independent DynaCad workstation COMPARISON:  Prior exams. FINDINGS: Breast composition: c. Heterogeneous fibroglandular tissue. Background parenchymal enhancement: Moderate. Right breast: There is a 5-6 mm oval enhancing  mass in the upper-outer quadrant of the right breast, middle to posterior depth, with washout kinetics (series 6, image 65). Left breast: No mass or abnormal enhancement. Lymph nodes: No abnormal appearing lymph nodes. Ancillary findings:  None. IMPRESSION: 1. There is a suspicious 5-6 mm oval enhancing mass in the upper-outer quadrant of the right breast. 2.  No MRI evidence of left breast malignancy. RECOMMENDATION: MRI guided biopsy is recommended for the right breast mass. BI-RADS CATEGORY  4: Suspicious. Electronically Signed   By: Ammie Ferrier M.D.   On: 12/29/2015 08:35   Mm Digital Diagnostic Unilat R  Result Date: 01/05/2016 CLINICAL DATA:  Evaluate biopsy marker placement after two MRI breast biopsies. EXAM: DIAGNOSTIC RIGHT MAMMOGRAM POST MRI BIOPSY COMPARISON:  Previous exam(s). FINDINGS: Mammographic images were obtained following MRI guided biopsy of 2 right breast masses. Both the dumbbell shaped clip and the cylinder-shaped clip are in good position. IMPRESSION: Appropriate placement of 2 biopsy markers after 2 MRI guided breast biopsies. Final Assessment: Post Procedure Mammograms for Marker Placement Electronically Signed   By: Dorise Bullion III M.D   On: 01/05/2016 09:24   Mr Rt Breast Bx W Loc Dev 1st Lesion Image Bx Spec Mr Guide  Addendum Date: 01/06/2016   ADDENDUM REPORT: 01/06/2016 13:03 ADDENDUM: Pathology revealed GRADE I INVASIVE DUCTAL CARCINOMA of the Right breast, upper outer posterior. FIBROCYSTIC CHANGES, ONE BENIGN LYMPH NODE, USUAL DUCTAL HYPERPLASIA of the Right breast, upper outer anterior. This was found to be concordant by Dr. Dorise Bullion. Pathology results were discussed with the patient by telephone. The patient reported doing well after the biopsies with tenderness at the sites. Post biopsy instructions and care were reviewed and questions were answered. The patient was encouraged to call [facility] for any additional concerns.  Surgical consultation has been arranged with Dr. Fanny Skates at [facility] on January 15, 2016. Pathology results reported by Terie Purser, RN on 01/06/2016. Electronically Signed   By: Dorise Bullion III M.D   On: 01/06/2016 13:03   Result Date: 01/06/2016 CLINICAL DATA:  The patient has a greater than 20% lifetime risk of breast cancer. A recent High risk screening breast MRI demonstrated a mass with enhancement in the upper outer right breast. At the time of today's biopsy, a second mass, also measuring 6 mm, was seen in the upper-outer or right breast more anteriorly and inferiorly located than lesion 1. EXAM: MRI GUIDED CORE NEEDLE BIOPSY OF THE RIGHT BREAST TECHNIQUE: Multiplanar, multisequence MR imaging of the right breast was performed both before and after administration of intravenous contrast. CONTRAST:  105m MULTIHANCE GADOBENATE DIMEGLUMINE 529 MG/ML IV SOLN COMPARISON:  Previous exams. FINDINGS: I met with the patient, and we discussed the procedure of MRI guided biopsy, including  risks, benefits, and alternatives. Specifically, we discussed the risks of infection, bleeding, tissue injury, clip migration, and inadequate sampling. Informed, written consent was given. The usual time out protocol was performed immediately prior to the procedure. Using sterile technique, 1% Lidocaine, MRI guidance, and a 9 gauge vacuum assisted device, biopsy was performed of both upper outer quadrant right breast masses using a lateral approach. At the conclusion of the procedure, a barbell shaped marker was placed at the site of lesion 1, the more posterior lesion, and a cylinder shaped marker was placed at the site of lesion 2, the more anterior lesion. Clips were deployed into the biopsy cavities. Follow-up 2-view mammogram was performed and dictated separately. IMPRESSION: MRI guided biopsy of 2 masses in the upper outer right breast. No apparent complications. Electronically Signed: By: Dorise Bullion III M.D On: 01/05/2016 08:58   Mr Rt Breast Bx W Loc Dev Ea Add Lesion Image Bx Spec Mr Guide  Addendum Date: 01/06/2016   ADDENDUM REPORT: 01/06/2016 13:03 ADDENDUM: Pathology revealed GRADE I INVASIVE DUCTAL CARCINOMA of the Right breast, upper outer posterior. FIBROCYSTIC CHANGES, ONE BENIGN LYMPH NODE, USUAL DUCTAL HYPERPLASIA of the Right breast, upper outer anterior. This was found to be concordant by Dr. Dorise Bullion. Pathology results were discussed with the patient by telephone. The patient reported doing well after the biopsies with tenderness at the sites. Post biopsy instructions and care were reviewed and questions were answered. The patient was encouraged to call [facility] for any additional concerns. Surgical consultation has been arranged with Dr. Fanny Skates at [facility] on January 15, 2016. Pathology results reported by Terie Purser, RN on 01/06/2016. Electronically Signed   By: Dorise Bullion III M.D   On: 01/06/2016 13:03   Result Date: 01/06/2016 CLINICAL DATA:  The patient has a greater than 20% lifetime risk of breast cancer. A recent High risk screening breast MRI demonstrated a mass with enhancement in the upper outer right breast. At the time of today's biopsy, a second mass, also measuring 6 mm, was seen in the upper-outer or right breast more anteriorly and inferiorly located than lesion 1. EXAM: MRI GUIDED CORE NEEDLE BIOPSY OF THE RIGHT BREAST TECHNIQUE: Multiplanar, multisequence MR imaging of the right breast was performed both before and after administration of intravenous contrast. CONTRAST:  23m MULTIHANCE GADOBENATE DIMEGLUMINE 529 MG/ML IV SOLN COMPARISON:  Previous exams. FINDINGS: I met with the patient, and we discussed the procedure of MRI guided biopsy, including risks, benefits, and alternatives. Specifically, we discussed the risks of infection, bleeding, tissue injury, clip migration, and inadequate  sampling. Informed, written consent was given. The usual time out protocol was performed immediately prior to the procedure. Using sterile technique, 1% Lidocaine, MRI guidance, and a 9 gauge vacuum assisted device, biopsy was performed of both upper outer quadrant right breast masses using a lateral approach. At the conclusion of the procedure, a barbell shaped marker was placed at the site of lesion 1, the more posterior lesion, and a cylinder shaped marker was placed at the site of lesion 2, the more anterior lesion. Clips were deployed into the biopsy cavities. Follow-up 2-view mammogram was performed and dictated separately. IMPRESSION: MRI guided biopsy of 2 masses in the upper outer right breast. No apparent complications. Electronically Signed: By: DDorise BullionIII M.D On: 01/05/2016 08:58    ELIGIBLE FOR AVAILABLE RESEARCH PROTOCOL: no  ASSESSMENT: 60y.o. BRCA negative [place] woman status post right breast upper  outer quadrant biopsy 01/05/2016 for a clinical T1b N0, stage IA invasive ductal carcinoma, grade 1, estrogen and progesterone receptor positive, HER-2 negative, with an MIB-1 of 10%  (1) definitive surgery pending  (2) consider Oncotype if tumor proves to be 1.0 cm or there are other features of concern  (3) adjuvant radiation to follow surgery  (4) anti-estrogens (most likely tamoxifen) to start at the completion of local treatment  (5) genetics testing pending  PLAN: We spent the better part of today's hour-long appointment discussing the biology of breast cancer in general, and the specifics of the patient's tumor in particular. We first reviewed the fact that cancer is not one disease but more than 100 different diseases and that it is important to keep them separate-- otherwise when friends and relatives discuss their own cancer experiences with Iya confusion can result. Similarly we explained that if breast cancer spreads to the bone or liver, the patient would not  have bone cancer or liver cancer, but breast cancer in the bone and breast cancer in the liver: one cancer in three places-- not 3 different cancers which otherwise would have to be treated in 3 different ways.  We discussed the difference between local and systemic therapy. In terms of loco-regional treatment, lumpectomy plus radiation is equivalent to mastectomy as far as survival is concerned. For this reason, and because the cosmetic results are generally superior, we recommend breast conserving surgery. We also noted that in terms of sequencing of treatments, whether systemic therapy or surgery is done first does not affect the ultimate outcome.  Next we went over the options for systemic therapy which are anti-estrogens, anti-HER-2 immunotherapy, and chemotherapy. Poc[place]ontas does not meet criteria for anti-HER-2 immunotherapy. She is a good candidate for anti-estrogens.  The question of chemotherapy is more complicated. Chemotherapy is most effective in rapidly growing, aggressive tumors. It is much less effective in low-grade, slow growing cancers, like Renalda 's. For that reason we may request an Oncotype from the definitive surgical sample, as suggested by NCCN guidelines. However, we have found in our experience that for tumors less than 1 cm who are low-grade and have a similar phenotype as Mahima's, there was not a single high risk case: All were low risk. Accordingly if her breast cancer proves to be smaller than 1 cm, as we expect, most likely an Oncotype will not be needed and she can proceed directly to radiation.   Though Mykayla has already been found to be BRCA 9 mutated, she is scheduled for a wider panel to be done mid-November. She may carry a deleterious mutation which may raise her risk of developing another cancer in either breast in the future to perhaps as high as 60% or more. In that situation any patient's all for bilateral mastectomies, but a regimen of intensified screening very much as  what Raychel already was doing (it would be a yearly MRI alternating 6 months later with yearly mammography) provides equal survival results. At this point Kianni expresses very little interest in considering bilateral mastectomies and is planning to proceed to lumpectomy and sentinel lymph node sampling as scheduled for 02/09/2016.  Amia did express some reluctance to undergo radiation. She understands this is standard of care, and that the results of lumpectomy without radiation are not equivalent to the results of mastectomy. Whether it would be safe to forego radiation in someone with a very small tumor like hers who is going to have very intensive screening for the rest of her  life is a question on which we have no data. She will discuss that with Dr. [name] when they meet later this week.  Dewalt does have a month-long trip abroad beginning mid-December which may complicate radiation planning. One option might be to start the tamoxifen after surgery and weight on the radiation until she returns from her trip in mid January).  The plan, then, is to start with surgery and proceed with genetics testing. If postponing radiation appears possible, Aelyn will start tamoxifen November 20 and see me early December to make sure she is tolerating it well prior to going abroad.  She has a good understanding of the overall plan. She agrees with it. She knows the goal of treatment in her case is cure. She will call with any problems that may develop before her next visit here.  Chauncey Cruel, MD   01/26/2016 5:41 PM Medical Oncology and Hematology [facility] [address] [place],  [address] Tel. [phone]    Fax. [phone]

## 2016-01-27 ENCOUNTER — Ambulatory Visit
Admission: RE | Admit: 2016-01-27 | Discharge: 2016-01-27 | Disposition: A | Discharge status: Home / Self Care | Payer: PRIVATE HEALTH INSURANCE | Source: Ambulatory Visit | Attending: Radiation Oncology | Admitting: Radiation Oncology

## 2016-01-27 ENCOUNTER — Ambulatory Visit: Payer: PRIVATE HEALTH INSURANCE

## 2016-01-27 DIAGNOSIS — Z170 Estrogen receptor positive status [ER+]: Secondary | ICD-10-CM | POA: Insufficient documentation

## 2016-01-27 DIAGNOSIS — C50411 Malignant neoplasm of upper-outer quadrant of right female breast: Secondary | ICD-10-CM | POA: Insufficient documentation

## 2016-01-27 DIAGNOSIS — Z8249 Family history of ischemic heart disease and other diseases of the circulatory system: Secondary | ICD-10-CM | POA: Insufficient documentation

## 2016-01-27 DIAGNOSIS — M810 Age-related osteoporosis without current pathological fracture: Secondary | ICD-10-CM | POA: Insufficient documentation

## 2016-01-27 DIAGNOSIS — Z803 Family history of malignant neoplasm of breast: Secondary | ICD-10-CM | POA: Insufficient documentation

## 2016-01-27 DIAGNOSIS — Z85820 Personal history of malignant melanoma of skin: Secondary | ICD-10-CM | POA: Insufficient documentation

## 2016-01-27 NOTE — Progress Notes (Signed)
Location of Breast Cancer:  Right Breast  Histology per Pathology Report:  01/05/16 Diagnosis 1. Breast, right, needle core biopsy, upper outer posterior - INVASIVE DUCTAL CARCINOMA. - SEE COMMENT. 2. Breast, right, needle core biopsy, upper outer anterior - FIBROCYSTIC CHANGES. - ONE BENIGN LYMPH NODE. - USUAL DUCTAL HYPERPLASIA. - THERE IS NO EVIDENCE OF MALIGNANCY  Receptor Status: ER(100%), PR (30%), Her2-neu (NEG), Ki-(10%)  Did patient present with symptoms or was this found on screening mammography?: It was found on a screening MRI which she had been having due to a family history of Breast Cancer.  Past/Anticipated interventions by surgeon, if any: She saw Dr. [name] on 01/16/16, surgery is scheduled for 02/10/16  Past/Anticipated interventions by medical oncology, if any:  01/26/16 Dr. Jana Hakim (1) definitive surgery pending (2) consider Oncotype if tumor proves to be 1.0 cm or there are other features of concern (3) adjuvant radiation to follow surgery (4) anti-estrogens (most likely tamoxifen) to start at the completion of local treatment (5) genetics testing pending 02/23/16  Julie Mack did express some reluctance to undergo radiation. She understands this is standard of care, and that the results of lumpectomy without radiation are not equivalent to the results of mastectomy. Whether it would be safe to forego radiation in someone with a very small tumor like hers who is going to have very intensive screening for the rest of her life is a question on which we have no data. She will discuss that with Dr. [name] when they meet later this week.  Besaw does have a month-long trip abroad beginning mid-December which may complicate radiation planning. One option might be to start the tamoxifen after surgery and weight on the radiation until she returns from her trip in mid January).  The plan, then, is to start with surgery and proceed with genetics testing. If postponing  radiation appears possible, Julie Mack will start tamoxifen November 20 and see me early December to make sure she is tolerating it well prior to going abroad.   Lymphedema issues, if any: N/A  Pain issues, if any: No  SAFETY ISSUES:  Prior radiation? No  Pacemaker/ICD? No  Possible current pregnancy? no  Is the patient on methotrexate? No  Current Complaints / other details:    BP 113/74   Pulse 63   Temp 98.2 F (36.8 C)   Ht '5\' 4"'  (1.626 m)   Wt 161 lb 6.4 oz (73.2 kg)   LMP 05/04/2011   SpO2 100% Comment: room air  BMI 27.70 kg/m     Harvir Patry, Stephani Police, RN 01/27/2016,9:55 AM

## 2016-01-28 ENCOUNTER — Telehealth: Payer: Self-pay | Admitting: *Deleted

## 2016-01-28 ENCOUNTER — Institutional Professional Consult (permissible substitution): Payer: PRIVATE HEALTH INSURANCE | Admitting: Radiation Oncology

## 2016-01-28 ENCOUNTER — Ambulatory Visit: Payer: PRIVATE HEALTH INSURANCE

## 2016-01-28 ENCOUNTER — Ambulatory Visit
Admission: RE | Admit: 2016-01-28 | Discharge: 2016-01-28 | Disposition: A | Discharge status: Home / Self Care | Payer: PRIVATE HEALTH INSURANCE | Source: Ambulatory Visit | Attending: Radiation Oncology | Admitting: Radiation Oncology

## 2016-01-28 ENCOUNTER — Encounter: Payer: Self-pay | Admitting: Radiation Oncology

## 2016-01-28 DIAGNOSIS — Z170 Estrogen receptor positive status [ER+]: Secondary | ICD-10-CM | POA: Diagnosis not present

## 2016-01-28 DIAGNOSIS — Z8249 Family history of ischemic heart disease and other diseases of the circulatory system: Secondary | ICD-10-CM | POA: Diagnosis not present

## 2016-01-28 DIAGNOSIS — C50411 Malignant neoplasm of upper-outer quadrant of right female breast: Secondary | ICD-10-CM

## 2016-01-28 DIAGNOSIS — Z803 Family history of malignant neoplasm of breast: Secondary | ICD-10-CM | POA: Diagnosis not present

## 2016-01-28 DIAGNOSIS — Z85820 Personal history of malignant melanoma of skin: Secondary | ICD-10-CM | POA: Diagnosis not present

## 2016-01-28 DIAGNOSIS — M810 Age-related osteoporosis without current pathological fracture: Secondary | ICD-10-CM | POA: Diagnosis not present

## 2016-01-28 NOTE — Telephone Encounter (Signed)
Called pt to give navigation resources and discuss care plan. Scheduled and confirmed f/u appt with Dr. Jana Hakim on 03/11/16. Discussed pt to start Tamoxifen on 11/20. [place] in December and will likely start xrt when she returns.  Denies further questions or needs at this time. Contact information provided.

## 2016-01-28 NOTE — Progress Notes (Signed)
Radiation Oncology         [phone]) [phone] ________________________________  Initial Outpatient Consultation  Name: Julie Mack MRN: [id number]  Date: 01/28/2016  DOB: 09/29/1955  EX:BMWU, Gwyndolyn Saxon, MD  Fanny Skates, MD   REFERRING PHYSICIAN: Fanny Skates, MD  DIAGNOSIS:    ICD-9-CM ICD-10-CM   1. Malignant neoplasm of upper-outer quadrant of right breast in female, estrogen receptor positive ([place]) 174.4 C50.411    V86.0 Z17.0    Stage IA [id number] Right Breast UOQ Invasive Ductal Carcinoma, ER+ / PR+ / Her2-, Grade I  HISTORY OF PRESENT ILLNESS::Julie Mack is a 60 y.o. female who presented with an abnormal screening MRI that she underwent due to a family history of breast cancer.   A 34m lesion was appreciated in the right breast; left breast and all nodes were negative. Biopsy of the right breast on 01/05/16 revealed invasive ductal carcinoma in the upper outer posterior breast. Biopsy of the upper outer anterior right breast revealed fibrocystic changes, one benign lymph node, usual ductal hyperplasia, and no evidence of malignancy. The receptor status is ER (100%), PR (30%), Her2 (-), Ki67 (10%). The patient was seen by Dr. IDalbert Batmanon 01/16/16 and he scheduled surgery for 02/10/16. The patient was also seen by Dr. MJana Hakimon 01/26/16. He recommended to consider oncotype if tumor proves to be 1.0 cm or has features of concern, adjuvant radiation to follow surgery, anti-estrogens (most likely tamoxifen) to start at the completion of local treatment, and genetics testing pending 02/23/16.   She denies lymphedema issues or pain at this time.  Of note, the patient has a month-long trip starting December 14th. She plans to start the Tamoxifen after surgery and would like to wait on the radiation until she returns January 14th.   No prior RT, had a melanoma in situ excised in the past.  Denies chance of pregnancy.  Lymphedema issues, if any: N/A  Pain issues, if any:  No  SAFETY ISSUES:  Prior radiation? No  Pacemaker/ICD? No  Is the patient on methotrexate? No  She is with her husband, they work in [place] She is in her USOH otherwise. PREVIOUS RADIATION THERAPY: No  PAST MEDICAL HISTORY:  has a past medical history of Melanoma ([place] and Osteoporosis.    PAST SURGICAL HISTORY: Past Surgical History:  Procedure Laterality Date  . [place]RA[place]  1967   cyst removed  . TONSILLECTOMY  1966    FAMILY HISTORY: family history includes Cancer in her maternal grandmother and sister; Cancer - Lung in her father; Heart attack in her mother; Stroke in her maternal grandfather and paternal grandfather.  SOCIAL HISTORY:  reports that she has never smoked. She does not have any smokeless tobacco history on file. She reports that she drinks alcohol. She reports that she does not use drugs.  ALLERGIES: Gluten meal  MEDICATIONS:  Current Outpatient Prescriptions  Medication Sig Dispense Refill  . betamethasone valerate ointment (VALISONE) 0.1 %     . fenoprofen (NALFON) 600 MG TABS tablet Take 600 mg by mouth.    . fluorouracil (EFUDEX) 5 % cream Apply topically as needed.    . folic acid (FOLVITE) 4132MCG tablet Take 400 mcg by mouth daily.    .Marland Kitchenglucosamine-chondroitin 500-400 MG tablet Take 1 tablet by mouth every morning.    . Multiple Vitamins-Minerals (MULTI COMPLETE PO) Take by mouth.    .Marland Kitchen  UNABLE TO FIND every morning. Med Name: calcium 600 mg    . UNABLE TO FIND every morning. Med Name: vitamin D    . UNABLE TO FIND every morning. Med Name: flax oil    . valACYclovir (VALTREX) 1000 MG tablet Take 1,000 mg by mouth 2 (two) times daily. 01/08/15 as needed for pain    . tamoxifen (NOLVADEX) 20 MG tablet Take 1 tablet (20 mg total) by mouth daily. (Patient not taking: Reported on 01/28/2016) 90 tablet 12   No current  facility-administered medications for this encounter.     REVIEW OF SYSTEMS:  Notable for that above.   PHYSICAL EXAM:  height is _0  (1.626 m) and weight is 161 lb 6.4 oz (73.2 kg). Her temperature is 98.2 F (36.8 C). Her blood pressure is 113/74 and her pulse is 63. Her oxygen saturation is 100%.   General: Alert and oriented, in no acute distress HEENT: Head is normocephalic. Extraocular movements are intact. Oropharynx, oral cavity is clear. Neck: Neck is supple, no palpable cervical or supraclavicular lymphadenopathy. Heart: Regular in rate and rhythm with no murmurs, rubs, or gallops. Chest: Clear to auscultation bilaterally, with no rhonchi, wheezes, or rales. Abdomen: Soft, nontender, nondistended, with no rigidity or guarding. Extremities: No cyanosis or edema.  Lymphatics: see Neck Exam Skin: No concerning lesions. Musculoskeletal: symmetric strength and muscle tone throughout. Neurologic: Cranial nerves II through XII are grossly intact. No obvious focalities. Speech is fluent. Coordination is intact. Psychiatric: Judgment and insight are intact. Affect is appropriate. Breasts: 10 o'clock region of right breast there is a  small palpable lump that is difficult to measure because a lot of it is surrounded by post biopsy fluid. Other wise no plapable masses in the breast or axillary regions.   ECOG = 0  0 - Asymptomatic (Fully active, able to carry on all predisease activities without restriction)  1 - Symptomatic but completely ambulatory (Restricted in physically strenuous activity but ambulatory and able to carry out work of a light or sedentary nature. For example, light housework, office work)  2 - Symptomatic, <50% in bed during the day (Ambulatory and capable of all self care but unable to carry out any work activities. Up and about more than 50% of waking hours)  3 - Symptomatic, >50% in bed, but not bedbound (Capable of only limited self-care, confined to bed or  chair 50% or more of waking hours)  4 - Bedbound (Completely disabled. Cannot carry on any self-care. Totally confined to bed or chair)  5 - Death   Eustace Pen MM, Creech RH, Tormey DC, et al. [phone]). "Toxicity and response criteria of the [facility]". [place] Oncol. 5 (6): 649-55   LABORATORY DATA:  Lab Results  Component Value Date   WBC 7.0 01/26/2016   HGB 13.7 01/26/2016   HCT 41.5 01/26/2016   MCV 88.5 01/26/2016   PLT 257 01/26/2016   CMP     Component Value Date/Time   NA 141 01/26/2016 1550   K 4.7 01/26/2016 1550   CO2 28 01/26/2016 1550   GLUCOSE 106 01/26/2016 1550   BUN 13.7 01/26/2016 1550   CREATININE 0.7 01/26/2016 1550   CALCIUM 9.8 01/26/2016 1550   PROT 7.6 01/26/2016 1550   ALBUMIN 3.9 01/26/2016 1550   AST 16 01/26/2016 1550   ALT 16 01/26/2016 1550   ALKPHOS 94 01/26/2016 1550   BILITOT 0.36 01/26/2016 1550         RADIOGRAPHY: Mm  Digital Diagnostic Unilat R  Result Date: 01/05/2016 CLINICAL DATA:  Evaluate biopsy marker placement after two MRI breast biopsies. EXAM: DIAGNOSTIC RIGHT MAMMOGRAM POST MRI BIOPSY COMPARISON:  Previous exam(s). FINDINGS: Mammographic images were obtained following MRI guided biopsy of 2 right breast masses. Both the dumbbell shaped clip and the cylinder-shaped clip are in good position. IMPRESSION: Appropriate placement of 2 biopsy markers after 2 MRI guided breast biopsies. Final Assessment: Post Procedure Mammograms for Marker Placement Electronically Signed   By: Dorise Bullion III M.D   On: 01/05/2016 09:24   Mr Rt Breast Bx W Loc Dev 1st Lesion Image Bx Spec Mr Guide  Addendum Date: 01/06/2016   ADDENDUM REPORT: 01/06/2016 13:03 ADDENDUM: Pathology revealed GRADE I INVASIVE DUCTAL CARCINOMA of the Right breast, upper outer posterior. FIBROCYSTIC CHANGES, ONE BENIGN LYMPH NODE, USUAL DUCTAL HYPERPLASIA of the Right breast, upper outer anterior. This was found to be concordant by Dr. Dorise Bullion. Pathology results were discussed with the patient by telephone. The patient reported doing well after the biopsies with tenderness at the sites. Post biopsy instructions and care were reviewed and questions were answered. The patient was encouraged to call [facility] for any additional concerns. Surgical consultation has been arranged with Dr. Fanny Skates at [facility] on January 15, 2016. Pathology results reported by Terie Purser, RN on 01/06/2016. Electronically Signed   By: Dorise Bullion III M.D   On: 01/06/2016 13:03   Result Date: 01/06/2016 CLINICAL DATA:  The patient has a greater than 20% lifetime risk of breast cancer. A recent High risk screening breast MRI demonstrated a mass with enhancement in the upper outer right breast. At the time of today's biopsy, a second mass, also measuring 6 mm, was seen in the upper-outer or right breast more anteriorly and inferiorly located than lesion 1. EXAM: MRI GUIDED CORE NEEDLE BIOPSY OF THE RIGHT BREAST TECHNIQUE: Multiplanar, multisequence MR imaging of the right breast was performed both before and after administration of intravenous contrast. CONTRAST:  14m MULTIHANCE GADOBENATE DIMEGLUMINE 529 MG/ML IV SOLN COMPARISON:  Previous exams. FINDINGS: I met with the patient, and we discussed the procedure of MRI guided biopsy, including risks, benefits, and alternatives. Specifically, we discussed the risks of infection, bleeding, tissue injury, clip migration, and inadequate sampling. Informed, written consent was given. The usual time out protocol was performed immediately prior to the procedure. Using sterile technique, 1% Lidocaine, MRI guidance, and a 9 gauge vacuum assisted device, biopsy was performed of both upper outer quadrant right breast masses using a lateral approach. At the conclusion of the procedure, a barbell shaped marker was placed at the site of lesion 1, the more posterior lesion, and a  cylinder shaped marker was placed at the site of lesion 2, the more anterior lesion. Clips were deployed into the biopsy cavities. Follow-up 2-view mammogram was performed and dictated separately. IMPRESSION: MRI guided biopsy of 2 masses in the upper outer right breast. No apparent complications. Electronically Signed: By: DDorise BullionIII M.D On: 01/05/2016 08:58   Mr Rt Breast Bx W Loc Dev Ea Add Lesion Image Bx Spec Mr Guide  Addendum Date: 01/06/2016   ADDENDUM REPORT: 01/06/2016 13:03 ADDENDUM: Pathology revealed GRADE I INVASIVE DUCTAL CARCINOMA of the Right breast, upper outer posterior. FIBROCYSTIC CHANGES, ONE BENIGN LYMPH NODE, USUAL DUCTAL HYPERPLASIA of the Right breast, upper outer anterior. This was found to be concordant by Dr. DDorise Bullion Pathology results were discussed with the patient  by telephone. The patient reported doing well after the biopsies with tenderness at the sites. Post biopsy instructions and care were reviewed and questions were answered. The patient was encouraged to call [facility] for any additional concerns. Surgical consultation has been arranged with Dr. Fanny Skates at [facility] on January 15, 2016. Pathology results reported by Terie Purser, RN on 01/06/2016. Electronically Signed   By: Dorise Bullion III M.D   On: 01/06/2016 13:03   Result Date: 01/06/2016 CLINICAL DATA:  The patient has a greater than 20% lifetime risk of breast cancer. A recent High risk screening breast MRI demonstrated a mass with enhancement in the upper outer right breast. At the time of today's biopsy, a second mass, also measuring 6 mm, was seen in the upper-outer or right breast more anteriorly and inferiorly located than lesion 1. EXAM: MRI GUIDED CORE NEEDLE BIOPSY OF THE RIGHT BREAST TECHNIQUE: Multiplanar, multisequence MR imaging of the right breast was performed both before and after administration of intravenous contrast. CONTRAST:   89m MULTIHANCE GADOBENATE DIMEGLUMINE 529 MG/ML IV SOLN COMPARISON:  Previous exams. FINDINGS: I met with the patient, and we discussed the procedure of MRI guided biopsy, including risks, benefits, and alternatives. Specifically, we discussed the risks of infection, bleeding, tissue injury, clip migration, and inadequate sampling. Informed, written consent was given. The usual time out protocol was performed immediately prior to the procedure. Using sterile technique, 1% Lidocaine, MRI guidance, and a 9 gauge vacuum assisted device, biopsy was performed of both upper outer quadrant right breast masses using a lateral approach. At the conclusion of the procedure, a barbell shaped marker was placed at the site of lesion 1, the more posterior lesion, and a cylinder shaped marker was placed at the site of lesion 2, the more anterior lesion. Clips were deployed into the biopsy cavities. Follow-up 2-view mammogram was performed and dictated separately. IMPRESSION: MRI guided biopsy of 2 masses in the upper outer right breast. No apparent complications. Electronically Signed: By: DDorise BullionIII M.D On: 01/05/2016 08:58      IMPRESSION/PLAN: Stage IA ER+ right breast cancer.  It was a pleasure meeting the patient today.  The consensus is that she is a good candidate for breast conservation. I talked to her about the option of a mastectomy and informed her that her expected overall survival would be equivalent between mastectomy and breast conservation, based upon randomized controlled data. She is enthusiastic about breast conservation.  We discussed the risks, benefits, and side effects of radiotherapy. We discussed the randomized data in favor of such. I recommend radiotherapy to the right breast to reduce her risk of locoregional recurrence by 2/3.  We discussed that radiation would take approximately 4-6 weeks to complete and that I would give the patient a few weeks to heal following surgery before  starting treatment planning. If chemotherapy were to be given, this would precede radiotherapy. We spoke about acute effects including skin irritation and fatigue as well as much less common late effects including internal organ injury or irritation. We spoke about the latest technology that is used to minimize the risk of late effects for patients undergoing radiotherapy to the breast or chest wall. No guarantees of treatment were given. The patient is enthusiastic about proceeding with treatment. I look forward to participating in the patient's care.  Schedule a follow-up and CT scan/ planning a week before patient's trip (December 14-January 14) and pursue radiation therapy after trip.  __________________________________________   Eppie Gibson, MD   This document serves as a record of services personally performed by Eppie Gibson, MD. It was created on her behalf by Bethann Humble, a trained medical scribe. The creation of this record is based on the scribe's personal observations and the provider's statements to them. This document has been checked and approved by the attending provider.

## 2016-01-29 ENCOUNTER — Telehealth: Payer: Self-pay | Admitting: *Deleted

## 2016-01-29 NOTE — Telephone Encounter (Signed)
CALLED PATIENT TO INFORM OF APPTS. FOR 03-12-16, SPOKE WITH PATIENT AND SHE IS AWARE OF THESE APPTS.

## 2016-02-02 ENCOUNTER — Telehealth: Payer: Self-pay | Admitting: Oncology

## 2016-02-02 NOTE — Telephone Encounter (Signed)
Spoke with patient re 12/7 lab/fu @ 11 am. Also confirmed 11/20 appointments.   Per 10/23 los patient was to have lab/GM 12/8 @ 9 am. Due to navigator has added f/u 12/7 and radonc has added f/u 12/8 @ 9am. GM fu 12/8 cxd and lab moved to 12/7 w/fu scheduled by navigator.

## 2016-02-03 ENCOUNTER — Encounter (HOSPITAL_BASED_OUTPATIENT_CLINIC_OR_DEPARTMENT_OTHER): Payer: Self-pay | Admitting: *Deleted

## 2016-02-09 ENCOUNTER — Ambulatory Visit
Admission: RE | Admit: 2016-02-09 | Discharge: 2016-02-09 | Disposition: A | Discharge status: Home / Self Care | Payer: PRIVATE HEALTH INSURANCE | Source: Ambulatory Visit | Attending: General Surgery | Admitting: General Surgery

## 2016-02-09 DIAGNOSIS — Z170 Estrogen receptor positive status [ER+]: Principal | ICD-10-CM

## 2016-02-09 DIAGNOSIS — C50411 Malignant neoplasm of upper-outer quadrant of right female breast: Secondary | ICD-10-CM

## 2016-02-09 NOTE — Progress Notes (Signed)
Boost drink dispensed at this time to pt. With specific instructions.  Pt verbalized understanding.

## 2016-02-09 NOTE — H&P (Signed)
Julie Mack. Donato Heinz Location: [facility] Patient #: [id number] DOB: 1955-10-29 Married / Language: English / Race: White Female        History of Present Illness   The patient is a 60 year old female who presents with breast cancer. This is a 60 year old Caucasian female, referred by Dr. Dorise Bullion at the breast center at [facility] for evaluation and management of a new invasive breast cancer right breast upper outer quadrant. Dr. Carmie Kanner is her PCP. Dr. Arvella Nigh is her gynecologist.  She has no prior history of breast cancer or breast problems. Her sister had breast cancer at age 59, stage II cancer, she is a survivor. The patient gets annual screening and periodic MRI. She recently had a screening MRI which showed the left breast to be normal. Lymph nodes to be normal. But there was a 6 mm mass in the upper outer quadrant of the right breast. They went ahead with biopsies of 2 areas in the upper outer quadrant. In the right breast, upper outer quadrant, posterior third there was a 6 mm mass that was biopsied and showed a grade 1 invasive ductal carcinoma, estrogen receptor 100%, progesterone receptor 30%, HER-2/neu negative, Ki-67 10%. There was a second area of enhancement in the right breast upper outer quadrant, anterior third there was biopsied and showed benign usual ductal hyperplasia.  The patient states she had genetic testing many years ago and was told that her BRCA1 and BRCA2 were negative. That was apparently the only screening that was done at that time. She is strongly motivated for breast conservation surgery and I think she is an excellent candidate for that.  Comorbidities include trigeminal neuralgia, melanoma in situ, ectopic pregnancy. Basically healthy.  Family history reveals sister diagnosed with breast cancer age 69, stage II. Had lumpectomy and chemotherapy and radiation therapy and is a survivor perhaps 12 years  later. Maternal grandmother had ovarian cancer. Maternal great aunt had breast cancer. Her father died of lung cancer. Mother living with coronary artery disease  Socially she is married. 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 They have 2 children who are twins age 35. Denies tobacco. Has one glass of wine a day.  We had a long discussion about the management of breast cancer in general and specific options for her. She is strongly motivated for lumpectomy and sentinel node biopsy regardless of genetic testing. I offered to refer her to medical oncology and radiation oncology preop and she said to go ahead and put the referrals in but she did not want to delay surgery and I felt that was reasonable. I am also referring her to genetic counseling.. I told the decisions about radiation therapy, antiestrogen therapy, and chemotherapy that would come later. I told them that the medical oncologist might want to do an Oncotype test on the tumor. We talked about the difference in survival and recurrence between lumpectomy and mastectomy. We talked about the difference between survival and recurrence if she had genetic mutation.  She wants to progress with her planning. She is going to [place] for 1 month on December 14. I told her that was reasonable. She should be recovered from her surgery and have treatment plans in place with radiation oncology and medical oncology about that time.  She will be scheduled for injection blue dye right breast, right breast lumpectomy with radioactive seed localization, right axillary sentinel lymph node biopsy. I discussed the indications, details, technique,  and numerous risk of surgery with her. She is aware of the risk of bleeding, infection, reoperation for positive margins or positive nodes, cosmetic deformity, shoulder disability, nerve damage with chronic pain or numbness. She understands all these issues well. All  of her questions were answered. She agrees with this plan.   Other Problems  Back Pain Breast Cancer Lump In Breast Melanoma  Past Surgical History  Breast Biopsy Right. Colon Polyp Removal - Colonoscopy Foot Surgery Right. Oral Surgery Tonsillectomy  Diagnostic Studies History  Colonoscopy 1-5 years ago Mammogram within last year Pap Smear 1-5 years ago  Allergies  GLUTEN, WHEAT  Medication History ( Betamethasone Valerate (0.1% Ointment, External) Active. Estrace (0.1MG/GM Cream, Vaginal) Active. ValACYclovir HCl (1GM Tablet, Oral as needed) Active. Medications Reconciled  Social History  Alcohol use Moderate alcohol use. No caffeine use No drug use Tobacco use Never smoker.  Family History  Alcohol Abuse Brother. Arthritis Mother. Breast Cancer Sister. Cancer Father. Cerebrovascular Accident Family Members In General. Heart Disease Mother. Heart disease in female family member before age 21 Hypertension Father, Mother. Migraine Headache Daughter. Ovarian Cancer Family Members In General.  Pregnancy / Birth History Age at menarche 27 years. Age of menopause 53-55 Contraceptive History Oral contraceptives. Gravida 3 Length (months) of breastfeeding 3-6 Maternal age 65-40 Para 2    Review of Systems  General Not Present- Appetite Loss, Chills, Fatigue, Fever, Night Sweats, Weight Gain and Weight Loss. Skin Not Present- Change in Wart/Mole, Dryness, Hives, Jaundice, New Lesions, Non-Healing Wounds, Rash and Ulcer. HEENT Present- Wears glasses/contact lenses. Not Present- Earache, Hearing Loss, Hoarseness, Nose Bleed, Oral Ulcers, Ringing in the Ears, Seasonal Allergies, Sinus Pain, Sore Throat, Visual Disturbances and Yellow Eyes. Respiratory Not Present- Bloody sputum, Chronic Cough, Difficulty Breathing, Snoring and Wheezing. Breast Present- Breast Mass. Not Present- Breast Pain, Nipple Discharge and Skin  Changes. Cardiovascular Not Present- Chest Pain, Difficulty Breathing Lying Down, Leg Cramps, Palpitations, Rapid Heart Rate, Shortness of Breath and Swelling of Extremities. Gastrointestinal Not Present- Abdominal Pain, Bloating, Bloody Stool, Change in Bowel Habits, Chronic diarrhea, Constipation, Difficulty Swallowing, Excessive gas, Gets full quickly at meals, Hemorrhoids, Indigestion, Nausea, Rectal Pain and Vomiting. Female Genitourinary Not Present- Frequency, Nocturia, Painful Urination, Pelvic Pain and Urgency. Musculoskeletal Present- Back Pain, Joint Pain and Joint Stiffness. Not Present- Muscle Pain, Muscle Weakness and Swelling of Extremities. Neurological Not Present- Decreased Memory, Fainting, Headaches, Numbness, Seizures, Tingling, Tremor, Trouble walking and Weakness. Psychiatric Not Present- Anxiety, Bipolar, Change in Sleep Pattern, Depression, Fearful and Frequent crying. Endocrine Present- Hot flashes. Not Present- Cold Intolerance, Excessive Hunger, Hair Changes, Heat Intolerance and New Diabetes. Hematology Not Present- Blood Thinners, Easy Bruising, Excessive bleeding, Gland problems, HIV and Persistent Infections.  Vitals  Weight: 157 lb Height: 63.5in Body Surface Area: 1.75 m Body Mass Index: 27.37 kg/m  Pulse: 71 (Regular)  BP: 120/70 (Sitting, Left Arm, Standard)    Physical Exam  General Mental Status-Alert. General Appearance-Consistent with stated age. Hydration-Well hydrated. Voice-Normal.  Head and Neck Head-normocephalic, atraumatic with no lesions or palpable masses. Trachea-midline. Thyroid Gland Characteristics - normal size and consistency.  Eye Eyeball - Bilateral-Extraocular movements intact. Sclera/Conjunctiva - Bilateral-No scleral icterus.  Chest and Lung Exam Chest and lung exam reveals -quiet, even and easy respiratory effort with no use of accessory muscles and on auscultation, normal breath sounds, no  adventitious sounds and normal vocal resonance. Inspection Chest Wall - Normal. Back - normal.  Breast Note: Breasts are medium sized. Soft and easy to examine. Ecchymoses  in right breast. Slight thickening in upper outer right breast probably from small hematoma. No large hematoma. No other mass in either breast. No other skin changes. No axillary adenopathy.`   Cardiovascular Cardiovascular examination reveals -normal heart sounds, regular rate and rhythm with no murmurs and normal pedal pulses bilaterally.  Abdomen Inspection Inspection of the abdomen reveals - No Hernias. Skin - Scar - no surgical scars. Palpation/Percussion Palpation and Percussion of the abdomen reveal - Soft, Non Tender, No Rebound tenderness, No Rigidity (guarding) and No hepatosplenomegaly. Auscultation Auscultation of the abdomen reveals - Bowel sounds normal.  Neurologic Neurologic evaluation reveals -alert and oriented x 3 with no impairment of recent or remote memory. Mental Status-Normal.  Musculoskeletal Normal Exam - Left-Upper Extremity Strength Normal and Lower Extremity Strength Normal. Normal Exam - Right-Upper Extremity Strength Normal and Lower Extremity Strength Normal.  Lymphatic Head & Neck  General Head & Neck Lymphatics: Bilateral - Description - Normal. Axillary  General Axillary Region: Bilateral - Description - Normal. Tenderness - Non Tender. Femoral & Inguinal  Generalized Femoral & Inguinal Lymphatics: Bilateral - Description - Normal. Tenderness - Non Tender.    Assessment & Plan  MALIGNANT NEOPLASM OF UPPER-OUTER QUADRANT OF RIGHT BREAST IN FEMALE, ESTROGEN RECEPTOR POSITIVE (C50.411)    Your recent imaging studies and biopsies show a small invasive ductal carcinoma right breast upper outer quadrant. This is 6 mm in diameter Estrogen and progesterone receptors are positive. HER-2 is negative Proliferation index is 10% Clinically this is a stage IA  cancer.  We have talked about surgical options as well as a multidisciplinary approach. It is your preference to proceed with right breast lumpectomy with radioactive seed localization and right axillary sentinel lymph node biopsy. I think that you are an excellent candidate for that approach  You will be referred to medical oncology and radiation oncology. You stated that you do not want to delay the surgery until those consultations are done. That is fine Decisions about radiation therapy, antiestrogen therapy, and chemotherapy will calm later  Please read over the printed information that you have been given.  FAMILY HISTORY OF BREAST CANCER IN FIRST DEGREE RELATIVE (Z80.3) Impression: Sister. Age 31. Survivor. HISTORY OF ECTOPIC PREGNANCY (Z87.59) TRIGEMINAL NEURALGIA (G50.0)

## 2016-02-10 ENCOUNTER — Ambulatory Visit
Admission: RE | Admit: 2016-02-10 | Discharge: 2016-02-10 | Disposition: A | Discharge status: Home / Self Care | Payer: PRIVATE HEALTH INSURANCE | Source: Ambulatory Visit | Attending: General Surgery | Admitting: General Surgery

## 2016-02-10 ENCOUNTER — Encounter (HOSPITAL_BASED_OUTPATIENT_CLINIC_OR_DEPARTMENT_OTHER): Payer: Self-pay | Admitting: Anesthesiology

## 2016-02-10 ENCOUNTER — Encounter (HOSPITAL_BASED_OUTPATIENT_CLINIC_OR_DEPARTMENT_OTHER)
Admission: RE | Disposition: A | Discharge status: Home / Self Care | Payer: Self-pay | Source: Ambulatory Visit | Attending: General Surgery

## 2016-02-10 ENCOUNTER — Encounter (HOSPITAL_COMMUNITY)
Admission: RE | Admit: 2016-02-10 | Discharge: 2016-02-10 | Disposition: A | Discharge status: Home / Self Care | Payer: PRIVATE HEALTH INSURANCE | Source: Ambulatory Visit | Attending: General Surgery | Admitting: General Surgery

## 2016-02-10 ENCOUNTER — Ambulatory Visit (HOSPITAL_BASED_OUTPATIENT_CLINIC_OR_DEPARTMENT_OTHER): Payer: PRIVATE HEALTH INSURANCE | Admitting: Anesthesiology

## 2016-02-10 ENCOUNTER — Ambulatory Visit (HOSPITAL_BASED_OUTPATIENT_CLINIC_OR_DEPARTMENT_OTHER)
Admission: RE | Admit: 2016-02-10 | Discharge: 2016-02-10 | Disposition: A | Discharge status: Home / Self Care | Payer: PRIVATE HEALTH INSURANCE | Source: Ambulatory Visit | Attending: General Surgery | Admitting: General Surgery

## 2016-02-10 DIAGNOSIS — Z803 Family history of malignant neoplasm of breast: Secondary | ICD-10-CM | POA: Insufficient documentation

## 2016-02-10 DIAGNOSIS — Z170 Estrogen receptor positive status [ER+]: Secondary | ICD-10-CM | POA: Insufficient documentation

## 2016-02-10 DIAGNOSIS — C50411 Malignant neoplasm of upper-outer quadrant of right female breast: Secondary | ICD-10-CM

## 2016-02-10 DIAGNOSIS — Z8041 Family history of malignant neoplasm of ovary: Secondary | ICD-10-CM | POA: Diagnosis not present

## 2016-02-10 DIAGNOSIS — G500 Trigeminal neuralgia: Secondary | ICD-10-CM | POA: Diagnosis not present

## 2016-02-10 HISTORY — PX: BREAST LUMPECTOMY WITH RADIOACTIVE SEED AND SENTINEL LYMPH NODE BIOPSY: SHX6550

## 2016-02-10 HISTORY — DX: Malignant neoplasm of unspecified site of unspecified female breast: C50.919

## 2016-02-10 HISTORY — DX: Unspecified asthma, uncomplicated: J45.909

## 2016-02-10 HISTORY — DX: Headache: R51

## 2016-02-10 HISTORY — DX: Headache, unspecified: R51.9

## 2016-02-10 HISTORY — PX: BREAST LUMPECTOMY: SHX2

## 2016-02-10 SURGERY — BREAST LUMPECTOMY WITH RADIOACTIVE SEED AND SENTINEL LYMPH NODE BIOPSY
Anesthesia: Regional | Site: Breast | Laterality: Right

## 2016-02-10 MED ORDER — FENTANYL CITRATE (PF) 100 MCG/2ML IJ SOLN
INTRAMUSCULAR | Status: AC
  Filled 2016-02-10: qty 2

## 2016-02-10 MED ORDER — DEXAMETHASONE SODIUM PHOSPHATE 10 MG/ML IJ SOLN
INTRAMUSCULAR | Status: AC
  Filled 2016-02-10: qty 1

## 2016-02-10 MED ORDER — FENTANYL CITRATE (PF) 100 MCG/2ML IJ SOLN
INTRAMUSCULAR | Status: DC | PRN
  Administered 2016-02-10: 50 ug via INTRAVENOUS
  Administered 2016-02-10: 100 ug via INTRAVENOUS
  Administered 2016-02-10: 50 ug via INTRAVENOUS

## 2016-02-10 MED ORDER — MIDAZOLAM HCL 5 MG/5ML IJ SOLN
INTRAMUSCULAR | Status: DC | PRN
  Administered 2016-02-10: 2 mg via INTRAVENOUS

## 2016-02-10 MED ORDER — LACTATED RINGERS IV SOLN
INTRAVENOUS | Status: DC
  Administered 2016-02-10 (×2): via INTRAVENOUS

## 2016-02-10 MED ORDER — LIDOCAINE 2% (20 MG/ML) 5 ML SYRINGE
INTRAMUSCULAR | Status: AC
  Filled 2016-02-10: qty 5

## 2016-02-10 MED ORDER — MIDAZOLAM HCL 2 MG/2ML IJ SOLN
INTRAMUSCULAR | Status: AC
  Filled 2016-02-10: qty 2

## 2016-02-10 MED ORDER — CELECOXIB 400 MG PO CAPS
400.0000 mg | ORAL_CAPSULE | ORAL | Status: AC
  Administered 2016-02-10: 400 mg via ORAL

## 2016-02-10 MED ORDER — BUPIVACAINE HCL (PF) 0.5 % IJ SOLN
INTRAMUSCULAR | Status: DC | PRN
  Administered 2016-02-10: 11 mL

## 2016-02-10 MED ORDER — MIDAZOLAM HCL 2 MG/2ML IJ SOLN
1.0000 mg | INTRAMUSCULAR | Status: DC | PRN
  Administered 2016-02-10: 2 mg via INTRAVENOUS

## 2016-02-10 MED ORDER — PROPOFOL 10 MG/ML IV BOLUS
INTRAVENOUS | Status: AC
  Filled 2016-02-10: qty 20

## 2016-02-10 MED ORDER — EPHEDRINE SULFATE 50 MG/ML IJ SOLN
INTRAMUSCULAR | Status: DC | PRN
  Administered 2016-02-10: 10 mg via INTRAVENOUS

## 2016-02-10 MED ORDER — CHLORHEXIDINE GLUCONATE CLOTH 2 % EX PADS: 6.0000 | MEDICATED_PAD | Freq: Once | CUTANEOUS | Status: DC

## 2016-02-10 MED ORDER — SODIUM CHLORIDE 0.9 % IJ SOLN
INTRAVENOUS | Status: DC | PRN
  Administered 2016-02-10: 5 mL via INTRAMUSCULAR

## 2016-02-10 MED ORDER — GLYCOPYRROLATE 0.2 MG/ML IJ SOLN
INTRAMUSCULAR | Status: DC | PRN
  Administered 2016-02-10: 0.2 mg via INTRAVENOUS

## 2016-02-10 MED ORDER — PROPOFOL 10 MG/ML IV BOLUS
INTRAVENOUS | Status: DC | PRN
  Administered 2016-02-10: 150 mg via INTRAVENOUS

## 2016-02-10 MED ORDER — CEFAZOLIN SODIUM-DEXTROSE 2-4 GM/100ML-% IV SOLN
INTRAVENOUS | Status: AC
  Filled 2016-02-10: qty 100

## 2016-02-10 MED ORDER — BUPIVACAINE HCL (PF) 0.5 % IJ SOLN
INTRAMUSCULAR | Status: AC
  Filled 2016-02-10: qty 30

## 2016-02-10 MED ORDER — OXYCODONE HCL 5 MG PO TABS: 5.0000 mg | ORAL_TABLET | Freq: Once | ORAL | Status: DC | PRN

## 2016-02-10 MED ORDER — PROMETHAZINE HCL 25 MG/ML IJ SOLN: 6.2500 mg | INTRAMUSCULAR | Status: DC | PRN

## 2016-02-10 MED ORDER — DEXAMETHASONE SODIUM PHOSPHATE 4 MG/ML IJ SOLN
INTRAMUSCULAR | Status: DC | PRN
  Administered 2016-02-10: 10 mg via INTRAVENOUS

## 2016-02-10 MED ORDER — ONDANSETRON HCL 4 MG/2ML IJ SOLN
INTRAMUSCULAR | Status: DC | PRN
  Administered 2016-02-10: 4 mg via INTRAVENOUS

## 2016-02-10 MED ORDER — BUPIVACAINE-EPINEPHRINE (PF) 0.5% -1:200000 IJ SOLN
INTRAMUSCULAR | Status: DC | PRN
  Administered 2016-02-10: 30 mL

## 2016-02-10 MED ORDER — CELECOXIB 200 MG PO CAPS
ORAL_CAPSULE | ORAL | Status: AC
  Filled 2016-02-10: qty 2

## 2016-02-10 MED ORDER — ONDANSETRON HCL 4 MG/2ML IJ SOLN
INTRAMUSCULAR | Status: AC
  Filled 2016-02-10: qty 2

## 2016-02-10 MED ORDER — HYDROMORPHONE HCL 1 MG/ML IJ SOLN: 0.2500 mg | INTRAMUSCULAR | Status: DC | PRN

## 2016-02-10 MED ORDER — SCOPOLAMINE 1 MG/3DAYS TD PT72: 1.0000 | MEDICATED_PATCH | Freq: Once | TRANSDERMAL | Status: DC | PRN

## 2016-02-10 MED ORDER — SODIUM CHLORIDE 0.9 % IJ SOLN
INTRAMUSCULAR | Status: AC
  Filled 2016-02-10: qty 10

## 2016-02-10 MED ORDER — MEPERIDINE HCL 25 MG/ML IJ SOLN: 6.2500 mg | INTRAMUSCULAR | Status: DC | PRN

## 2016-02-10 MED ORDER — ACETAMINOPHEN 500 MG PO TABS
1000.0000 mg | ORAL_TABLET | ORAL | Status: AC
  Administered 2016-02-10: 1000 mg via ORAL

## 2016-02-10 MED ORDER — GABAPENTIN 300 MG PO CAPS
ORAL_CAPSULE | ORAL | Status: AC
  Filled 2016-02-10: qty 1

## 2016-02-10 MED ORDER — FENTANYL CITRATE (PF) 100 MCG/2ML IJ SOLN
50.0000 ug | INTRAMUSCULAR | Status: DC | PRN
  Administered 2016-02-10 (×2): 50 ug via INTRAVENOUS

## 2016-02-10 MED ORDER — OXYCODONE HCL 5 MG/5ML PO SOLN: 5.0000 mg | Freq: Once | ORAL | Status: DC | PRN

## 2016-02-10 MED ORDER — METHYLENE BLUE 0.5 % INJ SOLN
INTRAVENOUS | Status: AC
  Filled 2016-02-10: qty 10

## 2016-02-10 MED ORDER — GLYCOPYRROLATE 0.2 MG/ML IV SOSY
PREFILLED_SYRINGE | INTRAVENOUS | Status: AC
  Filled 2016-02-10: qty 3

## 2016-02-10 MED ORDER — TECHNETIUM TC 99M SULFUR COLLOID FILTERED
1.0000 | Freq: Once | INTRAVENOUS | Status: AC | PRN
  Administered 2016-02-10: 1 via INTRADERMAL

## 2016-02-10 MED ORDER — GABAPENTIN 300 MG PO CAPS
300.0000 mg | ORAL_CAPSULE | ORAL | Status: AC
  Administered 2016-02-10: 300 mg via ORAL

## 2016-02-10 MED ORDER — TRAMADOL HCL 50 MG PO TABS: 50.0000 mg | ORAL_TABLET | Freq: Four times a day (QID) | ORAL | 0 refills | Status: DC | PRN

## 2016-02-10 MED ORDER — LIDOCAINE HCL (CARDIAC) 20 MG/ML IV SOLN
INTRAVENOUS | Status: DC | PRN
  Administered 2016-02-10: 30 mg via INTRAVENOUS

## 2016-02-10 MED ORDER — CEFAZOLIN SODIUM-DEXTROSE 2-4 GM/100ML-% IV SOLN
2.0000 g | INTRAVENOUS | Status: AC
  Administered 2016-02-10: 2 g via INTRAVENOUS

## 2016-02-10 MED ORDER — ACETAMINOPHEN 500 MG PO TABS
ORAL_TABLET | ORAL | Status: AC
  Filled 2016-02-10: qty 2

## 2016-02-10 SURGICAL SUPPLY — 61 items
APPLIER CLIP 9.375 MED OPEN (MISCELLANEOUS) ×2
BENZOIN TINCTURE PRP APPL 2/3 (GAUZE/BANDAGES/DRESSINGS) IMPLANT
BINDER BREAST LRG (GAUZE/BANDAGES/DRESSINGS) IMPLANT
BINDER BREAST MEDIUM (GAUZE/BANDAGES/DRESSINGS) IMPLANT
BINDER BREAST XLRG (GAUZE/BANDAGES/DRESSINGS) ×2 IMPLANT
BINDER BREAST XXLRG (GAUZE/BANDAGES/DRESSINGS) IMPLANT
BLADE HEX COATED 2.75 (ELECTRODE) ×2 IMPLANT
BLADE SURG 10 STRL SS (BLADE) ×2 IMPLANT
BLADE SURG 15 STRL LF DISP TIS (BLADE) ×1 IMPLANT
BLADE SURG 15 STRL SS (BLADE) ×1
CANISTER SUC SOCK COL 7IN (MISCELLANEOUS) IMPLANT
CANISTER SUCT 1200ML W/VALVE (MISCELLANEOUS) ×2 IMPLANT
CHLORAPREP W/TINT 26ML (MISCELLANEOUS) ×2 IMPLANT
CLIP APPLIE 9.375 MED OPEN (MISCELLANEOUS) ×1 IMPLANT
COVER BACK TABLE 60X90IN (DRAPES) ×2 IMPLANT
COVER MAYO STAND STRL (DRAPES) ×2 IMPLANT
COVER PROBE W GEL 5X96 (DRAPES) ×2 IMPLANT
DECANTER SPIKE VIAL GLASS SM (MISCELLANEOUS) ×2 IMPLANT
DERMABOND ADVANCED (GAUZE/BANDAGES/DRESSINGS) ×1
DERMABOND ADVANCED .7 DNX12 (GAUZE/BANDAGES/DRESSINGS) ×1 IMPLANT
DEVICE DUBIN W/COMP PLATE 8390 (MISCELLANEOUS) ×2 IMPLANT
DRAPE LAPAROSCOPIC ABDOMINAL (DRAPES) ×2 IMPLANT
DRAPE UTILITY XL STRL (DRAPES) ×2 IMPLANT
DRSG PAD ABDOMINAL 8X10 ST (GAUZE/BANDAGES/DRESSINGS) ×2 IMPLANT
ELECT REM PT RETURN 9FT ADLT (ELECTROSURGICAL) ×2
ELECTRODE REM PT RTRN 9FT ADLT (ELECTROSURGICAL) ×1 IMPLANT
GAUZE SPONGE 4X4 12PLY STRL (GAUZE/BANDAGES/DRESSINGS) ×2 IMPLANT
GLOVE BIOGEL PI IND STRL 7.0 (GLOVE) ×2 IMPLANT
GLOVE BIOGEL PI INDICATOR 7.0 (GLOVE) ×2
GLOVE ECLIPSE 6.5 STRL STRAW (GLOVE) ×2 IMPLANT
GLOVE EUDERMIC 7 POWDERFREE (GLOVE) ×4 IMPLANT
GOWN STRL REUS W/ TWL LRG LVL3 (GOWN DISPOSABLE) ×1 IMPLANT
GOWN STRL REUS W/ TWL XL LVL3 (GOWN DISPOSABLE) ×1 IMPLANT
GOWN STRL REUS W/TWL LRG LVL3 (GOWN DISPOSABLE) ×1
GOWN STRL REUS W/TWL XL LVL3 (GOWN DISPOSABLE) ×1
ILLUMINATOR WAVEGUIDE N/F (MISCELLANEOUS) IMPLANT
KIT MARKER MARGIN INK (KITS) ×2 IMPLANT
LIGHT WAVEGUIDE WIDE FLAT (MISCELLANEOUS) IMPLANT
NDL SAFETY ECLIPSE 18X1.5 (NEEDLE) ×1 IMPLANT
NEEDLE HYPO 18GX1.5 SHARP (NEEDLE) ×1
NEEDLE HYPO 25X1 1.5 SAFETY (NEEDLE) ×4 IMPLANT
NS IRRIG 1000ML POUR BTL (IV SOLUTION) ×2 IMPLANT
PACK BASIN DAY SURGERY FS (CUSTOM PROCEDURE TRAY) ×2 IMPLANT
PENCIL BUTTON HOLSTER BLD 10FT (ELECTRODE) ×2 IMPLANT
SHEET MEDIUM DRAPE 40X70 STRL (DRAPES) ×2 IMPLANT
SLEEVE SCD COMPRESS KNEE MED (MISCELLANEOUS) ×2 IMPLANT
SPONGE LAP 18X18 X RAY DECT (DISPOSABLE) IMPLANT
SPONGE LAP 4X18 X RAY DECT (DISPOSABLE) ×10 IMPLANT
STRIP CLOSURE SKIN 1/2X4 (GAUZE/BANDAGES/DRESSINGS) IMPLANT
SUT MNCRL AB 4-0 PS2 18 (SUTURE) ×2 IMPLANT
SUT SILK 2 0 SH (SUTURE) ×2 IMPLANT
SUT VIC AB 2-0 CT1 27 (SUTURE)
SUT VIC AB 2-0 CT1 TAPERPNT 27 (SUTURE) IMPLANT
SUT VIC AB 3-0 SH 27 (SUTURE)
SUT VIC AB 3-0 SH 27X BRD (SUTURE) IMPLANT
SUT VICRYL 3-0 CR8 SH (SUTURE) ×2 IMPLANT
SYR 10ML LL (SYRINGE) ×4 IMPLANT
TOWEL OR 17X24 6PK STRL BLUE (TOWEL DISPOSABLE) ×2 IMPLANT
TOWEL OR NON WOVEN STRL DISP B (DISPOSABLE) IMPLANT
TUBE CONNECTING 20X1/4 (TUBING) ×2 IMPLANT
YANKAUER SUCT BULB TIP NO VENT (SUCTIONS) ×2 IMPLANT

## 2016-02-10 NOTE — Progress Notes (Signed)
Emotional support during breast injections °

## 2016-02-10 NOTE — Op Note (Signed)
Patient Name:           Julie Mack   Date of Surgery:        02/10/2016  Pre op Diagnosis:      Malignant neoplasm of upper outer quadrant of right breast in female, estrogen receptor positive  Post op Diagnosis:    Same  Procedure:                 Inject blue dye right breast,                                      Right breast lumpectomy with radioactive seed localization                                      Right axillary sentinel lymph node biopsy  Surgeon:                     Edsel Petrin. [name], M.D., FACS  Assistant:                      OR staff   Indication for Assistant: N/A  Operative Indications:    This is a 60 year old Caucasian female, referred by Dr. Dorise Bullion at the breast center at [facility] for evaluation and management of a new invasive breast cancer right breast upper outer quadrant. Dr. Carmie Kanner is her PCP. Dr. Arvella Nigh is her gynecologist.      She has no prior history of breast cancer or breast problems. Her sister had breast cancer at age 34, stage II cancer, she is a survivor. The patient gets annual screening and periodic MRI. She recently had a screening MRI which showed the left breast to be normal. Lymph nodes to be normal. But there was a 6 mm mass in the upper outer quadrant of the right breast. They went ahead with biopsies of 2 areas in the upper outer quadrant. In the right breast, upper outer quadrant, posterior third there was a 6 mm mass that was biopsied and showed a grade 1 invasive ductal carcinoma, estrogen receptor 100%, progesterone receptor 30%, HER-2/neu negative, Ki-67 10%. There was a second area of enhancement in the right breast upper outer quadrant, anterior third there was biopsied and showed benign usual ductal hyperplasia.      The patient states she had genetic testing many years ago and was told that her BRCA1 and BRCA2 were negative. That was apparently the only screening that was done at that time.   She is strongly motivated for breast conservation surgery and I think she is an excellent candidate for that.     Comorbidities include trigeminal neuralgia, melanoma in situ, ectopic pregnancy. Basically healthy.     Family history reveals sister diagnosed with breast cancer age 59, stage II. Had lumpectomy and chemotherapy and radiation therapy and is a survivor perhaps 12 years later. Maternal grandmother had ovarian cancer. Maternal great aunt had breast cancer. Her father died of lung cancer. Mother living with coronary artery disease       We had a long discussion about the management of breast cancer in general and specific options for her. She is strongly motivated for lumpectomy and sentinel node biopsy regardless of genetic testing.  She has seen Dr. Randell Patient not and Dr. [name]  preop.       She will be scheduled for right breast lumpectomy with radioactive seed localization, right axillary sentinel lymph node biopsy. I discussed the indications, details, technique, and numerous risk of surgery with her. She is aware of the risk of bleeding, infection, reoperation for positive margins or positive nodes, cosmetic deformity, shoulder disability, nerve damage with chronic pain or numbness. She understands all these issues well. All of her questions were answered. She agrees with this plan.  Operative Findings:       Right pectoral block was performed preop by the anesthesiologist.  I could hear the radioactive seed in the breast using the neoprobe in the preop holding area.  In the operating room lumpectomy was performed through incision high in the upper outer quadrant and the specimen mammogram looked good, showing the marker clip and the seed in the center of the specimen.  I found 3 sentinel lymph nodes but they were not pathologically enlarged.  The breast was very vascular and bled more than usual.  Complete hemostasis at the end of the case, however  Procedure in Detail:           Following the induction of general LMA anesthesia the patient received intravenous antibiotics and a surgical timeout was performed.  Following alcohol prep I injected 5 mL of dilute methylene blue into the right breast, subareolar area, and massaged breast for a few minutes.  The entire right chest wall and axilla were then prepped and draped in a sterile fashion.  0.5% Marcaine was used as local infiltration anesthetic.  Using the neoprobe I  identified the area of maximum radioactivity high in the upper outer quadrant of the right breast.  Curvilinear incision was made at this point and dissection was carried down into the breast tissue and around the radioactive signal trying to take a reasonably generous margin.  The specimen was removed and marked with silk sutures and a 6 color ink kit to orient the pathologist.  Specimen mammogram looked good as described above and  the specimen was marked and sent to the lab.  This wound was packed.  Transverse incision was made in the right axilla.  Using the neoprobe set on the technetium setting I took the dissection down through the clavipectoral fascia.  Identified 3 sentinel lymph nodes and remove them and sent them to the lab for routine histology.  There was no more radioactivity.  Both wounds were irrigated with saline.  Hemostasis was excellent and achieved with  electrocautery.  5 metal marker clips were placed in the lumpectomy cavity at the 5 cardinal positions.  Both wounds were closed in layers with 3-0 Vicryl and running subcuticular 4-0 Monocryl and Dermabond.  Dry bandages and a breast binder were placed.  Patient tolerated the procedure well was taken to PACU in stable condition.  EBL 40-50 mL.  Counts correct.  Complications none.     Edsel Petrin. [name], M.D., FACS General and Minimally Invasive Surgery Breast and Colorectal Surgery  02/10/2016 11:37 AM

## 2016-02-10 NOTE — Anesthesia Procedure Notes (Signed)
Anesthesia Regional Block:  Pectoralis block  Pre-Anesthetic Checklist: ,, timeout performed, Correct Patient, Correct Site, Correct Laterality, Correct Procedure, Correct Position, site marked, Risks and benefits discussed,  Surgical consent,  Pre-op evaluation,  At surgeon's request and post-op pain management  Laterality: Right  Prep: chloraprep       Needles:   Needle Type: Stimiplex     Needle Length: 9cm 9 cm     Additional Needles:  Procedures: ultrasound guided (picture in chart) Pectoralis block Narrative:  Injection made incrementally with aspirations every 5 mL.  Performed by: Personally  Anesthesiologist: Nolon Nations  Additional Notes: Patient tolerated well. Good fascial spread noted.

## 2016-02-10 NOTE — Discharge Instructions (Signed)
[place] Office Phone Number [phone]  BREAST BIOPSY/ PARTIAL MASTECTOMY: POST OP INSTRUCTIONS  Always review your discharge instruction sheet given to you by the facility where your surgery was performed.  IF YOU HAVE DISABILITY OR FAMILY LEAVE FORMS, YOU MUST BRING THEM TO THE OFFICE FOR PROCESSING.  DO NOT GIVE THEM TO YOUR DOCTOR.  1. A prescription for pain medication may be given to you upon discharge.  Take your pain medication as prescribed, if needed.  If narcotic pain medicine is not needed, then you may take acetaminophen (Tylenol) or ibuprofen (Advil) as needed. 2. Take your usually prescribed medications unless otherwise directed 3. If you need a refill on your pain medication, please contact your pharmacy.  They will contact our office to request authorization.  Prescriptions will not be filled after 5pm or on week-ends. 4. You should eat very light the first 24 hours after surgery, such as soup, crackers, pudding, etc.  Resume your normal diet the day after surgery. 5. Most patients will experience some swelling and bruising in the breast.  Ice packs and a good support bra will help.  Swelling and bruising can take several days to resolve.  6. It is common to experience some constipation if taking pain medication after surgery.  Increasing fluid intake and taking a stool softener will usually help or prevent this problem from occurring.  A mild laxative (Milk of Magnesia or Miralax) should be taken according to package directions if there are no bowel movements after 48 hours. 7. Unless discharge instructions indicate otherwise, you may remove your bandages 24-48 hours after surgery, and you may shower at that time.  You may have steri-strips (small skin tapes) in place directly over the incision.  These strips should be left on the skin for 7-10 days.  If your surgeon used skin glue on the incision, you may shower in 24 hours.  The glue will flake off over the  next 2-3 weeks.  Any sutures or staples will be removed at the office during your follow-up visit. 8. ACTIVITIES:  You may resume regular daily activities (gradually increasing) beginning the next day.  Wearing a good support bra or sports bra minimizes pain and swelling.  You may have sexual intercourse when it is comfortable. a. You may drive when you no longer are taking prescription pain medication, you can comfortably wear a seatbelt, and you can safely maneuver your car and apply brakes. b. RETURN TO WORK:  ______________________________________________________________________________________ 9. You should see your doctor in the office for a follow-up appointment approximately two weeks after your surgery.  Your doctors nurse will typically make your follow-up appointment when she calls you with your pathology report.  Expect your pathology report 2-3 business days after your surgery.  You may call to check if you do not hear from [place] after three days. 10. OTHER INSTRUCTIONS: _______________________________________________________________________________________________ _____________________________________________________________________________________________________________________________________ _____________________________________________________________________________________________________________________________________ _____________________________________________________________________________________________________________________________________  WHEN TO CALL YOUR DOCTOR: 1. Fever over 101.0 2. Nausea and/or vomiting. 3. Extreme swelling or bruising. 4. Continued bleeding from incision. 5. Increased pain, redness, or drainage from the incision.  The clinic staff is available to answer your questions during regular business hours.  Please dont hesitate to call and ask to speak to one of the nurses for clinical concerns.  If you have a medical emergency, go to the nearest  emergency room or call 911.  A surgeon from [facility] is always on call at the hospital.  For further questions, please visit [email or web address] Central  [place] Office Phone Number [phone]  BREAST BIOPSY/ PARTIAL MASTECTOMY: POST OP INSTRUCTIONS  Always review your discharge instruction sheet given to you by the facility where your surgery was performed.  IF YOU HAVE DISABILITY OR FAMILY LEAVE FORMS, YOU MUST BRING THEM TO THE OFFICE FOR PROCESSING.  DO NOT GIVE THEM TO YOUR DOCTOR.  11. A prescription for pain medication may be given to you upon discharge.  Take your pain medication as prescribed, if needed.  If narcotic pain medicine is not needed, then you may take acetaminophen (Tylenol) or ibuprofen (Advil) as needed. 12. Take your usually prescribed medications unless otherwise directed 13. If you need a refill on your pain medication, please contact your pharmacy.  They will contact our office to request authorization.  Prescriptions will not be filled after 5pm or on week-ends. 14. You should eat very light the first 24 hours after surgery, such as soup, crackers, pudding, etc.  Resume your normal diet the day after surgery. 15. Most patients will experience some swelling and bruising in the breast.  Ice packs and a good support bra will help.  Swelling and bruising can take several days to resolve.  16. It is common to experience some constipation if taking pain medication after surgery.  Increasing fluid intake and taking a stool softener will usually help or prevent this problem from occurring.  A mild laxative (Milk of Magnesia or Miralax) should be taken according to package directions if there are no bowel movements after 48 hours. 17. Unless discharge instructions indicate otherwise, you may remove your bandages 24-48 hours after surgery, and you may shower at that time.  You may have steri-strips (small skin tapes) in place directly over the  incision.  These strips should be left on the skin for 7-10 days.  If your surgeon used skin glue on the incision, you may shower in 24 hours.  The glue will flake off over the next 2-3 weeks.  Any sutures or staples will be removed at the office during your follow-up visit. 18. ACTIVITIES:  You may resume regular daily activities (gradually increasing) beginning the next day.  Wearing a good support bra or sports bra minimizes pain and swelling.  You may have sexual intercourse when it is comfortable. a. You may drive when you no longer are taking prescription pain medication, you can comfortably wear a seatbelt, and you can safely maneuver your car and apply brakes. b. RETURN TO WORK:  ______________________________________________________________________________________ 19. You should see your doctor in the office for a follow-up appointment approximately two weeks after your surgery.  Your doctors nurse will typically make your follow-up appointment when she calls you with your pathology report.  Expect your pathology report 2-3 business days after your surgery.  You may call to check if you do not hear from [place] after three days. 20. OTHER INSTRUCTIONS: _______________________________________________________________________________________________ _____________________________________________________________________________________________________________________________________ _____________________________________________________________________________________________________________________________________ _____________________________________________________________________________________________________________________________________  WHEN TO CALL YOUR DOCTOR: 6. Fever over 101.0 7. Nausea and/or vomiting. 8. Extreme swelling or bruising. 9. Continued bleeding from incision. 10. Increased pain, redness, or drainage from the incision.  The clinic staff is available to answer your questions  during regular business hours.  Please dont hesitate to call and ask to speak to one of the nurses for clinical concerns.  If you have a medical emergency, go to the nearest emergency room or call 911.  A surgeon from [facility] is always on call at the hospital.  For further questions, please visit [email or web address]  Post Anesthesia Home Care Instructions ° °Activity: °Get plenty of rest for the remainder of the day. A responsible adult should stay with you for 24 hours following the procedure.  °For the next 24 hours, DO NOT: °-Drive a car °-Operate machinery °-Drink alcoholic beverages °-Take any medication unless instructed by your physician °-Make any legal decisions or sign important papers. ° °Meals: °Start with liquid foods such as gelatin or soup. Progress to regular foods as tolerated. Avoid greasy, spicy, heavy foods. If nausea and/or vomiting occur, drink only clear liquids until the nausea and/or vomiting subsides. Call your physician if vomiting continues. ° °Special Instructions/Symptoms: °Your throat may feel dry or sore from the anesthesia or the breathing tube placed in your throat during surgery. If this causes discomfort, gargle with warm salt water. The discomfort should disappear within 24 hours. ° °If you had a scopolamine patch placed behind your ear for the management of post- operative nausea and/or vomiting: ° °1. The medication in the patch is effective for 72 hours, after which it should be removed.  Wrap patch in a tissue and discard in the trash. Wash hands thoroughly with soap and water. °2. You may remove the patch earlier than 72 hours if you experience unpleasant side effects which may include dry mouth, dizziness or visual disturbances. °3. Avoid touching the patch. Wash your hands with soap and water after contact with the patch. °  ° °

## 2016-02-10 NOTE — Anesthesia Preprocedure Evaluation (Signed)
Anesthesia Evaluation  Patient identified by MRN, date of birth, ID band Patient awake    Reviewed: Allergy & Precautions, NPO status , Patient's Chart, lab work & pertinent test results  Airway Mallampati: II  TM Distance: >3 FB Neck ROM: Full    Dental no notable dental hx.    Pulmonary asthma ,    Pulmonary exam normal breath sounds clear to auscultation       Cardiovascular negative cardio ROS Normal cardiovascular exam Rhythm:Regular Rate:Normal     Neuro/Psych  Headaches, negative psych ROS   GI/Hepatic negative GI ROS, Neg liver ROS,   Endo/Other  negative endocrine ROS  Renal/GU negative Renal ROS     Musculoskeletal negative musculoskeletal ROS (+)   Abdominal   Peds  Hematology negative hematology ROS (+)   Anesthesia Other Findings   Reproductive/Obstetrics negative OB ROS                             Anesthesia Physical Anesthesia Plan  ASA: II  Anesthesia Plan: General and Regional   Post-op Pain Management: GA combined w/ Regional for post-op pain   Induction: Intravenous  Airway Management Planned: LMA  Additional Equipment:   Intra-op Plan:   Post-operative Plan: Extubation in OR  Informed Consent: I have reviewed the patients History and Physical, chart, labs and discussed the procedure including the risks, benefits and alternatives for the proposed anesthesia with the patient or authorized representative who has indicated his/her understanding and acceptance.   Dental advisory given  Plan Discussed with: CRNA  Anesthesia Plan Comments:         Anesthesia Quick Evaluation

## 2016-02-10 NOTE — Anesthesia Postprocedure Evaluation (Signed)
Anesthesia Post Note  Patient: Julie Mack  Procedure(s) Performed: Procedure(s) (LRB): RIGHT BREAST LUMPECTOMY WITH RADIOACTIVE SEED AND RIGHT SENTINEL LYMPH NODE BIOPSY WITH INJECT BLUE DYE (Right)  Patient location during evaluation: PACU Anesthesia Type: General and Regional Level of consciousness: sedated and patient cooperative Pain management: pain level controlled Vital Signs Assessment: post-procedure vital signs reviewed and stable Respiratory status: spontaneous breathing Cardiovascular status: stable Anesthetic complications: no    Last Vitals:  Vitals:   02/10/16 1215 02/10/16 1245  BP: 115/74 112/68  Pulse: 65 63  Resp: 15 16  Temp:  36.4 C    Last Pain:  Vitals:   02/10/16 1245  TempSrc:   PainSc: 3                  Nolon Nations

## 2016-02-10 NOTE — Transfer of Care (Signed)
Immediate Anesthesia Transfer of Care Note  Patient: Julie Mack  Procedure(s) Performed: Procedure(s): RIGHT BREAST LUMPECTOMY WITH RADIOACTIVE SEED AND RIGHT SENTINEL LYMPH NODE BIOPSY WITH INJECT BLUE DYE (Right)  Patient Location: PACU  Anesthesia Type:GA combined with regional for post-op pain  Level of Consciousness: awake and patient cooperative  Airway & Oxygen Therapy: Patient Spontanous Breathing and Patient connected to face mask oxygen  Post-op Assessment: Report given to RN and Post -op Vital signs reviewed and stable  Post vital signs: Reviewed and stable  Last Vitals:  Vitals:   02/10/16 0938 02/10/16 0939  BP: 104/72   Pulse: 73 74  Resp: 12 12  Temp:      Last Pain:  Vitals:   02/10/16 0844  TempSrc: Oral         Complications: No apparent anesthesia complications

## 2016-02-10 NOTE — Interval H&P Note (Signed)
History and Physical Interval Note:  02/10/2016 9:59 AM  Rutherford Limerick  has presented today for surgery, with the diagnosis of RIGHT BREAST CANCER  The various methods of treatment have been discussed with the patient and family. After consideration of risks, benefits and other options for treatment, the patient has consented to  Procedure(s): RIGHT BREAST LUMPECTOMY WITH RADIOACTIVE SEED AND RIGHT SENTINEL LYMPH NODE BIOPSY WITH INJECT BLUE DYE (Right) as a surgical intervention .  The patient's history has been reviewed, patient examined, no change in status, stable for surgery.  I have reviewed the patient's chart and labs.  Questions were answered to the patient's satisfaction.     Julie Mack

## 2016-02-10 NOTE — Progress Notes (Signed)
Assisted Dr. Germeroth with right, ultrasound guided, pectoralis block. Side rails up, monitors on throughout procedure. See vital signs in flow sheet. Tolerated Procedure well. 

## 2016-02-10 NOTE — Anesthesia Procedure Notes (Signed)
Procedure Name: LMA Insertion Date/Time: 02/10/2016 10:09 AM Performed by: Toula Moos L Pre-anesthesia Checklist: Patient identified, Emergency Drugs available, Suction available, Patient being monitored and Timeout performed Patient Re-evaluated:Patient Re-evaluated prior to inductionOxygen Delivery Method: Circle system utilized Preoxygenation: Pre-oxygenation with 100% oxygen Intubation Type: IV induction Ventilation: Mask ventilation without difficulty LMA: LMA inserted LMA Size: 4.0 Number of attempts: 1 Airway Equipment and Method: Bite block Placement Confirmation: positive ETCO2 Tube secured with: Tape Dental Injury: Teeth and Oropharynx as per pre-operative assessment

## 2016-02-11 ENCOUNTER — Ambulatory Visit: Payer: PRIVATE HEALTH INSURANCE | Admitting: Radiation Oncology

## 2016-02-11 ENCOUNTER — Encounter (HOSPITAL_BASED_OUTPATIENT_CLINIC_OR_DEPARTMENT_OTHER): Payer: Self-pay | Admitting: General Surgery

## 2016-02-12 ENCOUNTER — Telehealth: Payer: Self-pay | Admitting: *Deleted

## 2016-02-12 NOTE — Telephone Encounter (Signed)
Ordered oncotype per Dr. Magrinat.  Faxed requisition to pathology and confirmed receipt.  

## 2016-02-13 ENCOUNTER — Encounter: Payer: Self-pay | Admitting: Oncology

## 2016-02-13 ENCOUNTER — Telehealth: Payer: Self-pay | Admitting: *Deleted

## 2016-02-13 NOTE — Telephone Encounter (Signed)
  Oncology Nurse Navigator Documentation  Navigator Location: [facility] (02/13/16 1000)   )Navigator Encounter Type: Telephone (Informed Oncotype testing was sent out per Dr. Jana Hakim) (02/13/16 1000) Telephone: Lahoma Crocker Call;Appt Confirmation/Clarification (02/13/16 1000)                       Barriers/Navigation Needs: No barriers at this time;No Questions;No Needs (02/13/16 1000)   Interventions: None required (02/13/16 1000)                      Time Spent with Patient: 15 (02/13/16 1000)

## 2016-02-17 ENCOUNTER — Encounter: Payer: Self-pay | Admitting: *Deleted

## 2016-02-23 ENCOUNTER — Encounter: Payer: Self-pay | Admitting: Genetic Counselor

## 2016-02-23 ENCOUNTER — Ambulatory Visit (HOSPITAL_BASED_OUTPATIENT_CLINIC_OR_DEPARTMENT_OTHER): Payer: PRIVATE HEALTH INSURANCE | Admitting: Genetic Counselor

## 2016-02-23 ENCOUNTER — Telehealth: Payer: Self-pay | Admitting: *Deleted

## 2016-02-23 ENCOUNTER — Other Ambulatory Visit: Payer: PRIVATE HEALTH INSURANCE

## 2016-02-23 DIAGNOSIS — Z803 Family history of malignant neoplasm of breast: Secondary | ICD-10-CM | POA: Diagnosis not present

## 2016-02-23 DIAGNOSIS — Z170 Estrogen receptor positive status [ER+]: Secondary | ICD-10-CM

## 2016-02-23 DIAGNOSIS — C50411 Malignant neoplasm of upper-outer quadrant of right female breast: Secondary | ICD-10-CM | POA: Diagnosis not present

## 2016-02-23 DIAGNOSIS — Z315 Encounter for genetic counseling: Secondary | ICD-10-CM

## 2016-02-23 DIAGNOSIS — Z8041 Family history of malignant neoplasm of ovary: Secondary | ICD-10-CM

## 2016-02-23 NOTE — Progress Notes (Signed)
REFERRING PROVIDER: Martha Clan, MD [address] [place], [place] [address]   Julie Cancer, MD  PRIMARY PROVIDER:  Martha Clan, MD  PRIMARY REASON FOR VISIT:  1. Malignant neoplasm of upper-outer quadrant of right breast in female, estrogen receptor positive (HCC)   2. Family history of breast Mack   3. Family history of ovarian Mack      HISTORY OF PRESENT ILLNESS:   Julie Mack, a 60 y.o. female, was seen for a [facility] Mack genetics consultation at the request of Dr. Darnelle Mack due to a personal and family history of Mack.  Julie Mack presents to clinic today to discuss the possibility of a hereditary predisposition to Mack, genetic testing, and to further clarify her future Mack risks, as well as potential Mack risks for family members.   In 2017, at the age of 67, Julie Mack was diagnosed with invasive ductal carcinoma of the right breast. This will be treated based on her genetics. She had previous testing in 2011 for BRCA1 and BRCA2 testing and was negative.     Mack HISTORY:   No history exists.     HORMONAL RISK FACTORS:  Menarche was at age 35.  First live birth at age 55.  OCP use for approximately 30 years.  Ovaries intact: yes.  Hysterectomy: no.  Menopausal status: postmenopausal.  HRT use: 0 years. Colonoscopy: yes; normal. Mammogram within the last year: yes. Number of breast biopsies: 1. Up to date with pelvic exams:  yes. Any excessive radiation exposure in the past:  no  Past Medical History:  Diagnosis Date  . Asthma    allergy induced asthma as child, none now  . Breast Mack (HCC) 01/2016   right breast Mack  . Family history of breast Mack   . Family history of ovarian Mack   . Headache   . Melanoma (HCC)    in situ  . Osteoporosis     Past Surgical History:  Procedure Laterality Date  . ANKLE FRACTURE SURGERY Right 2015  . BREAST LUMPECTOMY WITH RADIOACTIVE SEED AND SENTINEL LYMPH NODE BIOPSY  Right 02/10/2016   Procedure: RIGHT BREAST LUMPECTOMY WITH RADIOACTIVE SEED AND RIGHT SENTINEL LYMPH NODE BIOPSY WITH INJECT BLUE DYE;  Surgeon: Julie Kelp, MD;  Location: [facility];  Service: General;  Laterality: Right;  . DILATION AND CURETTAGE OF UTERUS    . ECTOPIC PREGNANCY SURGERY  1996  . EYE SURGERY  1967   cyst removed  . TONSILLECTOMY  1966    Social History   Social History  . Marital status: Married    Spouse name: Julie Mack  . Number of children: 2  . Years of education: 16   Occupational History  .      [employer], hotels   Social History Main Topics  . Smoking status: Never Smoker  . Smokeless tobacco: Never Used  . Alcohol use 0.0 oz/week     Comment: 2 glasses wine a day  . Drug use: No  . Sexual activity: Yes    Birth control/ protection: Post-menopausal   Other Topics Concern  . None   Social History Narrative   Lives at home with husband   decaff only     FAMILY HISTORY:  We obtained a detailed, 4-generation family history.  Significant diagnoses are listed below: Family History  Problem Relation Age of Onset  . Heart attack Mother   . Mack - Lung Father   . Breast Mack Sister 16  . Ovarian  Mack Maternal Grandmother 40  . Stroke Maternal Grandfather   . Stroke Paternal Grandfather   . Breast Mack Other     MGMs sister dx under 39  . Breast Mack Other     MGMs sister dx over 29  . Breast Mack Other     PGMs sister    The patient has two sisters and two brothers.  One sister was diagnosed with breast Mack at 37.  The patient's parents are both living.  Her father was diagnosed with lung Mack at 70.  He is an only child.  His mother died at 38 and his father died at 9 from a stroke.  His father had a sister who had breast Mack, as did two of her daughters.    The patient's mother is an only child.  Her mother died of ovarian Mack at 18.  Her mother had two sisters who had breast Mack  and one sister had a granddaughter with breast Mack.  Patient's maternal ancestors are of [place] and [place] descent, and paternal ancestors are of [place] and [place] descent. There is no reported Ashkenazi Jewish ancestry. There is no known consanguinity.  GENETIC COUNSELING ASSESSMENT: Julie Mack is a 60 y.o. female with a personal and family history of breast Mack and family history of ovarian Mack which is somewhat suggestive of a hereditary Mack syndrome and predisposition to Mack. We, therefore, discussed and recommended the following at today's visit.   DISCUSSION: We discussed that about 5-10% of breast Mack is hereditary with most cases due to BRCA mutations.  We reviewed her previously negative genetic testing and that BART testing was not performed.  This missed about 7-10% of individuals who were actually positive for a BRCA mutation.  We discussed that there are other genes associated with hereditary breast Mack syndromes, such as ATM, PALB2 and CHEK2.  We reviewed the characteristics, features and inheritance patterns of hereditary Mack syndromes. We also discussed genetic testing, including the appropriate family members to test, the process of testing, insurance coverage and turn-around-time for results. We discussed the implications of a negative, positive and/or variant of uncertain significant result. We recommended Julie Mack pursue genetic testing for the [facility] gene panel. The [facility] gene panel offered by [employer] includes sequencing and deletion/duplication testing of the following 27 genes: APC, ATM, BARD1, BMPR1A, BRCA1, BRCA2, BRIP1, CHD1, CDK4, CDKN2A, CHEK2, EPCAM (large rearrangement only), MLH1, MSH2, MSH6, MUTYH, NBN, PALB2, PMS2, PTEN, RAD51C, RAD51D, SMAD4, STK11, and TP53. Sequencing was performed for select regions of POLE and POLD1, and large rearrangement analysis was performed for select regions of GREM1.   Based on  Julie Mack's personal and family history of Mack, she meets medical criteria for genetic testing. Despite that she meets criteria, she may still have an out of pocket cost. We discussed that if her out of pocket cost for testing is over $100, the laboratory will call and confirm whether she wants to proceed with testing.  If the out of pocket cost of testing is less than $100 she will be billed by the genetic testing laboratory.   PLAN: After considering the risks, benefits, and limitations, Julie Mack  provided informed consent to pursue genetic testing and the blood sample was sent to [facility] for analysis of the [facility]. Results should be available within approximately 2-3 weeks' time, at which point they will be disclosed by telephone to Julie Mack, as will any additional recommendations warranted by these results. Julie Mack  will receive a summary of her genetic counseling visit and a copy of her results once available. This information will also be available in Epic. We encouraged Julie Mack to remain in contact with Mack genetics annually so that we can continuously update the family history and inform her of any changes in Mack genetics and testing that may be of benefit for her family. Julie Mack questions were answered to her satisfaction today. Our contact information was provided should additional questions or concerns arise.  Based on Julie Mack's family history, we recommended her sister, who was diagnosed with breast Mack at age 29, have genetic counseling and testing. Julie Mack will let us know if we can be of any assistance in coordinating genetic counseling and/or testing for this family member.   Lastly, we encouraged Julie Mack to remain in contact with Mack genetics annually so that we can continuously update the family history and inform her of any changes in Mack genetics and testing that may be of benefit for this family.    Ms.  Mack questions were answered to her satisfaction today. Our contact information was provided should additional questions or concerns arise. Thank you for the referral and allowing [place] to share in the care of your patient.   Karen P. Florene Glen, [place], [facility] Certified Genetic Counselor [email or web address] phone: [phone]  The patient was seen for a total of 45 minutes in face-to-face genetic counseling.  This patient was discussed with Drs. Magrinat, Lindi Adie and/or Burr Medico who agrees with the above.    _______________________________________________________________________ For Office Staff:  Number of people involved in session: 1 Was an Intern/ student involved with case: no

## 2016-02-23 NOTE — Telephone Encounter (Signed)
VM transferred from Triage to this RN's phone from Mauricia Area with [facility] states she received request to send block for Oncotype.  Block was received with insufficient  material for testing.  This note will be sent to MD and [facility] Nurse navigator.

## 2016-02-29 ENCOUNTER — Other Ambulatory Visit: Payer: Self-pay | Admitting: Oncology

## 2016-03-02 ENCOUNTER — Encounter: Payer: Self-pay | Admitting: Oncology

## 2016-03-02 ENCOUNTER — Telehealth: Payer: Self-pay | Admitting: *Deleted

## 2016-03-02 NOTE — Telephone Encounter (Signed)
Received oncotype score of 14/9%. Physician team notified. Called pt with results and informed she did NOT need chemotherapy. Pt relate she did not need to keep appt with Dr. Jana Hakim on 12/7. Confirmed appt with Dr. [name].   Pt relate she had redness and warmth to touch to right breast. Denies fever or drainage. Relate she spoke to triage nurse at Dr. Darrel Hoover office and waiting on return call.   Denies further needs at this time.

## 2016-03-05 ENCOUNTER — Telehealth: Payer: Self-pay | Admitting: Genetic Counselor

## 2016-03-05 ENCOUNTER — Ambulatory Visit: Payer: Self-pay | Admitting: Genetic Counselor

## 2016-03-05 ENCOUNTER — Encounter (HOSPITAL_COMMUNITY): Payer: Self-pay

## 2016-03-05 ENCOUNTER — Encounter: Payer: Self-pay | Admitting: Genetic Counselor

## 2016-03-05 DIAGNOSIS — C50411 Malignant neoplasm of upper-outer quadrant of right female breast: Secondary | ICD-10-CM

## 2016-03-05 DIAGNOSIS — Z8041 Family history of malignant neoplasm of ovary: Secondary | ICD-10-CM

## 2016-03-05 DIAGNOSIS — Z1379 Encounter for other screening for genetic and chromosomal anomalies: Secondary | ICD-10-CM | POA: Insufficient documentation

## 2016-03-05 DIAGNOSIS — Z803 Family history of malignant neoplasm of breast: Secondary | ICD-10-CM

## 2016-03-05 DIAGNOSIS — Z170 Estrogen receptor positive status [ER+]: Secondary | ICD-10-CM

## 2016-03-05 NOTE — Telephone Encounter (Signed)
Revealed negative genetic testing on the [facility] panel.  Discussed that we do not know why she developed breast cancer or why there is cancer in the family.  It does not appear to be due to hereditary changes in the genes we looked at.  Recommended that she keep in contact with genetics periodically to ensure that there are not any additional genetic tests that she would need in the future.

## 2016-03-05 NOTE — Progress Notes (Signed)
HPI: Ms. Calbert was previously seen in the [facility] due to a personal and family history of cancer and concerns regarding a hereditary predisposition to cancer. Please refer to our prior cancer genetics clinic note for more information regarding Ms. Blaydes's medical, social and family histories, and our assessment and recommendations, at the time. Ms. Courtney recent genetic test results were disclosed to her, as were recommendations warranted by these results. These results and recommendations are discussed in more detail below.  FAMILY HISTORY:  We obtained a detailed, 4-generation family history.  Significant diagnoses are listed below: Family History  Problem Relation Age of Onset  . Heart attack Mother   . Cancer - Lung Father   . Breast cancer Sister 25  . Ovarian cancer Maternal Grandmother 11  . Stroke Maternal Grandfather   . Stroke Paternal Grandfather   . Breast cancer Other     MGMs sister dx under 59  . Breast cancer Other     MGMs sister dx over 86  . Breast cancer Other     PGMs sister    The patient has two sisters and two brothers.  One sister was diagnosed with breast cancer at 65.  The patient's parents are both living.  Her father was diagnosed with lung cancer at 35.  He is an only child.  His mother died at 35 and his father died at 27 from a stroke.  His father had a sister who had breast cancer, as did two of her daughters.    The patient's mother is an only child.  Her mother died of ovarian cancer at 20.  Her mother had two sisters who had breast cancer and one sister had a granddaughter with breast cancer.  Patient's maternal ancestors are of [place] and [place] descent, and paternal ancestors are of [place] and [place] descent. There is no reported Ashkenazi Jewish ancestry. There is no known consanguinity.  GENETIC TEST RESULTS: Genetic testing reported out on March 02, 2016 through the [facility] cancer panel found no  deleterious mutations.  The [facility] gene panel offered by [employer] includes sequencing and deletion/duplication testing of the following 27 genes: APC, ATM, BARD1, BMPR1A, BRCA1, BRCA2, BRIP1, CHD1, CDK4, CDKN2A, CHEK2, EPCAM (large rearrangement only), MLH1, MSH2, MSH6, MUTYH, NBN, PALB2, PMS2, PTEN, RAD51C, RAD51D, SMAD4, STK11, and TP53. Sequencing was performed for select regions of POLE and POLD1, and large rearrangement analysis was performed for select regions of GREM1. The test report has been scanned into EPIC and is located under the Molecular Pathology section of the Results Review tab.   We discussed with Ms. Musgrave that since the current genetic testing is not perfect, it is possible there may be a gene mutation in one of these genes that current testing cannot detect, but that chance is small. We also discussed, that it is possible that another gene that has not yet been discovered, or that we have not yet tested, is responsible for the cancer diagnoses in the family, and it is, therefore, important to remain in touch with cancer genetics in the future so that we can continue to offer Ms. Bottomley the most up to date genetic testing.   CANCER SCREENING RECOMMENDATIONS: Given Ms. Kister's personal and family histories, we must interpret these negative results with some caution.  Families with features suggestive of hereditary risk for cancer tend to have multiple family members with cancer, diagnoses in multiple generations and diagnoses before the age of 70. Ms. Underhill  family exhibits some of these features. Thus this result may simply reflect our current inability to detect all mutations within these genes or there may be a different gene that has not yet been discovered or tested.   RECOMMENDATIONS FOR FAMILY MEMBERS: Women in this family might be at some increased risk of developing cancer, over the general population risk, simply due to the family history  of cancer. We recommended women in this family have a yearly mammogram beginning at age 65, or 22 years younger than the earliest onset of cancer, an annual clinical breast exam, and perform monthly breast self-exams. Women in this family should also have a gynecological exam as recommended by their primary provider. All family members should have a colonoscopy by age 37.  Based on Ms. Devall's family history, we recommended her sister, who was diagnosed with breast cancer at age 61, have genetic counseling and testing. Ms. Leitch will let us know if we can be of any assistance in coordinating genetic counseling and/or testing for this family member.   FOLLOW-UP: Lastly, we discussed with Ms. Schwoerer that cancer genetics is a rapidly advancing field and it is possible that new genetic tests will be appropriate for her and/or her family members in the future. We encouraged her to remain in contact with cancer genetics on an annual basis so we can update her personal and family histories and let her know of advances in cancer genetics that may benefit this family.   Our contact number was provided. Ms. Weir questions were answered to her satisfaction, and she knows she is welcome to call us at anytime with additional questions or concerns.   Roma Kayser, MS, [facility] Certified Genetic Counselor [email or web address]

## 2016-03-09 NOTE — Progress Notes (Addendum)
Location of Breast Cancer: Right Breast  Histology per Pathology Report:  01/05/16 Diagnosis 1. Breast, right, needle core biopsy, upper outer posterior - INVASIVE DUCTAL CARCINOMA. - SEE COMMENT. 2. Breast, right, needle core biopsy, upper outer anterior - FIBROCYSTIC CHANGES. - ONE BENIGN LYMPH NODE. - USUAL DUCTAL HYPERPLASIA. - THERE IS NO EVIDENCE OF MALIGNANCY  Receptor Status: ER(100%), PR (30%), Her2-neu (NEG), Ki-(10%)  02/10/16 Diagnosis 1. Breast, lumpectomy, Right - INVASIVE DUCTAL CARCINOMA, GRADE I/III, SPANNING 1.7 CM. - DUCTAL CARCINOMA IN SITU, INTERMEDIATE GRADE. - THE SURGICAL RESECTION MARGINS ARE NEGATIVE FOR CARCINOMA. - SEE ONCOLOGY TABLE BELOW. 2. Lymph node, sentinel, biopsy, Right #1 - THERE IS NO EVIDENCE OF CARCINOMA IN 1 OF 1 LYMPH NODE (0/1). 3. Lymph node, sentinel, biopsy, Right #2 - THERE IS NO EVIDENCE OF CARCINOMA IN 1 OF 1 LYMPH NODE (0/1). 4. Lymph node, sentinel, biopsy, Right #3 - THERE IS NO EVIDENCE OF CARCINOMA IN 1 OF 1 LYMPH NODE (0/1). 5. Breast, accessory tissue, Right - BENIGN FIBROADIPOSE TISSUE. - THERE IS NO EVIDENCE OF MALIGNANCY.   Did patient present with symptoms or was this found on screening mammography?: It was found on a screening MRI which she had been having due to a family history of Breast Cancer.  Past/Anticipated interventions by surgeon, if any: Procedure:                 Inject blue dye right breast,                                      Right breast lumpectomy with radioactive seed localization                                      Right axillary sentinel lymph node biopsy Surgeon:                     Edsel Petrin. [name], M.D., [facility]   Past/Anticipated interventions by medical oncology, if any: 01/26/16 Dr. Jana Hakim (1) definitive surgery pending (done 02/10/16) (2) consider Oncotype if tumor proves to be 1.0 cm or there are other features of concern (Oncotype completed, she does not need chemotherapy) (3)  adjuvant radiation to follow surgery (4) anti-estrogens (most likely tamoxifen) to start at the completion of local treatment (5) genetics testing pending 02/23/16 (Resulted as negative on 03/05/16)   Lymphedema issues, if any:  She denies. She has good arm mobility in her left arm. She has a rash under her Right Breast which her surgeon believes may be related to having her lymph nodes removed, and were not concerned about it.   Pain issues, if any: She denies.   SAFETY ISSUES:  Prior radiation? No  Pacemaker/ICD? No  Possible current pregnancy? No  Is the patient on methotrexate? No  Current Complaints / other details:   She is currently taking Tamoxifin and started 02/23/16 She has a trip planned for December 14th through mid January, and plans to start radiation after her trip, but to be simulated today.   BP 119/71   Pulse 68   Temp 97.7 F (36.5 C)   Ht _0  (1.626 m)   Wt 162 lb 6.4 oz (73.7 kg)   LMP 05/04/2011   SpO2 100% Comment: room air  BMI 27.88 kg/m      Awesome Jared, Anderson Malta  L, RN 03/09/2016,3:03 PM

## 2016-03-11 ENCOUNTER — Ambulatory Visit: Payer: PRIVATE HEALTH INSURANCE | Admitting: Oncology

## 2016-03-11 ENCOUNTER — Other Ambulatory Visit: Payer: PRIVATE HEALTH INSURANCE

## 2016-03-12 ENCOUNTER — Ambulatory Visit: Payer: Self-pay | Admitting: Genetic Counselor

## 2016-03-12 ENCOUNTER — Ambulatory Visit: Payer: PRIVATE HEALTH INSURANCE | Admitting: Oncology

## 2016-03-12 ENCOUNTER — Encounter: Payer: Self-pay | Admitting: Radiation Oncology

## 2016-03-12 ENCOUNTER — Ambulatory Visit
Admission: RE | Admit: 2016-03-12 | Discharge: 2016-03-12 | Disposition: A | Discharge status: Home / Self Care | Payer: PRIVATE HEALTH INSURANCE | Source: Ambulatory Visit | Admitting: Radiation Oncology

## 2016-03-12 ENCOUNTER — Ambulatory Visit
Admission: RE | Admit: 2016-03-12 | Discharge: 2016-03-12 | Disposition: A | Discharge status: Home / Self Care | Payer: PRIVATE HEALTH INSURANCE | Source: Ambulatory Visit | Attending: Radiation Oncology | Admitting: Radiation Oncology

## 2016-03-12 ENCOUNTER — Other Ambulatory Visit: Payer: PRIVATE HEALTH INSURANCE

## 2016-03-12 DIAGNOSIS — C50411 Malignant neoplasm of upper-outer quadrant of right female breast: Secondary | ICD-10-CM

## 2016-03-12 DIAGNOSIS — Z170 Estrogen receptor positive status [ER+]: Principal | ICD-10-CM

## 2016-03-12 NOTE — Progress Notes (Signed)
  Radiation Oncology         (336) [phone] ________________________________  Name: Julie Mack MRN: [id number]  Date: 03/12/2016  DOB: [birth year]  SIMULATION AND TREATMENT PLANNING NOTE    Outpatient  DIAGNOSIS:     ICD-9-CM ICD-10-CM   1. Malignant neoplasm of upper-outer quadrant of right breast in female, estrogen receptor positive ([place]) 174.4 C50.411    V86.0 Z17.0     NARRATIVE:  The [facility].  Identity was confirmed.  All relevant records and images related to the planned course of therapy were reviewed.  The patient freely provided informed written consent to proceed with treatment after reviewing the details related to the planned course of therapy. The consent form was witnessed and verified by the simulation staff.    Then, the patient was set-up in a stable reproducible supine position for radiation therapy with her ipsilateral arm over her head, and her upper body secured in a custom-made Vac-lok device.  CT images were obtained.  Surface markings were placed.  The CT images were loaded into the planning software.    TREATMENT PLANNING NOTE: Treatment planning then occurred.  The radiation prescription was entered and confirmed.     A total of 3 medically necessary complex treatment devices were fabricated and supervised by me: 2 fields with MLCs for custom blocks to protect heart, and lungs;  and, a Vac-lok. MORE COMPLEX DEVICES MAY BE MADE IN DOSIMETRY FOR FIELD IN FIELD BEAMS FOR DOSE HOMOGENEITY.  I have requested : 3D Simulation which is medically necessary to give adequate dose to at risk tissues while sparing lungs and heart.  I have requested a DVH of the following structures: lungs, heart, lumpectomy cavity.    The patient will receive 40.05 Gy in 15 fractions to the right breast with 2 tangential fields.  This will be followed by a boost.  Optical Surface Tracking Plan:  Since intensity modulated radiotherapy (IMRT)  and 3D conformal radiation treatment methods are predicated on accurate and precise positioning for treatment, intrafraction motion monitoring is medically necessary to ensure accurate and safe treatment delivery. The ability to quantify intrafraction motion without excessive ionizing radiation dose can only be performed with optical surface tracking. Accordingly, surface imaging offers the opportunity to obtain 3D measurements of patient position throughout IMRT and 3D treatments without excessive radiation exposure. I am ordering optical surface tracking for this patient's upcoming course of radiotherapy.  ________________________________   Reference:  Ursula Alert, J, et al. Surface imaging-based analysis of intrafraction motion for breast radiotherapy patients.Journal of [place], n. 6, nov. 2014. ISSN [id number].  Available at: <http://www.jacmp.org/index.php/jacmp/article/view/4957>.    -----------------------------------  Eppie Gibson, MD

## 2016-03-12 NOTE — Progress Notes (Signed)
Radiation Oncology         [phone] ________________________________  Name: Julie Mack MRN: [id number]  Date: 03/12/2016  DOB: 1955-11-10  Follow-Up Visit Note  Outpatient  CC: Marton Redwood, MD  Marton Redwood, MD  Diagnosis:      ICD-9-CM ICD-10-CM   1. Malignant neoplasm of upper-outer quadrant of right breast in female, estrogen receptor positive ([place]) 174.4 C50.411    V86.0 Z17.0      Stage IA pathologic T1cN0  clinical M0 Right Breast UOQ Invasive Ductal Carcinoma, ER+ / PR+ / Her2-, Grade I  CHIEF COMPLAINT: Here to discuss management of Right breast cancer after surgery  Narrative:  The patient returns today for follow-up.     Since consultation, she underwent right lumpectomy with radioactive seed and lymph node sentinel biopsy on 02/10/16.   Tumor was Grade I, 1.7cm, clear margins, all 3 SLN negative.  The patient participated in genetics testing, which resulted as negative on 03/05/16. Oncotype was completed and determined that the patient does not require chemotherapy at this time. The patient began taking Tamoxifen 02/23/16, and is currently taking.  The patient denies pain. The patient denies lymphedema issues. She reports good mobility in her left arm. She reports some lower GI issues that began when she started taking Tamoxifen, and she has discussed this with Dr. Jana Hakim. Overall the patient is without complaint.  The patient questions the difference between the standard 6 weeks of treatment and the accelerated 4 week treatment plan that I have recommended for her.           ALLERGIES:  is allergic to gluten meal.  Meds: Current Outpatient Prescriptions  Medication Sig Dispense Refill  . betamethasone valerate ointment (VALISONE) 0.1 %     . fluorouracil (EFUDEX) 5 % cream Apply topically as needed.    . tamoxifen (NOLVADEX) 20 MG tablet Take 20 mg by mouth daily.    . folic acid (FOLVITE) 154 MCG tablet Take 400 mcg by mouth daily.    Marland Kitchen  glucosamine-chondroitin 500-400 MG tablet Take 1 tablet by mouth every morning.    . Multiple Vitamins-Minerals (MULTI COMPLETE PO) Take by mouth.    . traMADol (ULTRAM) 50 MG tablet Take 1-2 tablets (50-100 mg total) by mouth every 6 (six) hours as needed. (Patient not taking: Reported on 03/12/2016) 30 tablet 0  . UNABLE TO FIND every morning. Med Name: calcium 600 mg    . UNABLE TO FIND every morning. Med Name: vitamin D    . UNABLE TO FIND every morning. Med Name: flax oil    . valACYclovir (VALTREX) 1000 MG tablet Take 1,000 mg by mouth 2 (two) times daily. 01/08/15 as needed for pain    . vitamin C (ASCORBIC ACID) 500 MG tablet Take 500 mg by mouth daily.     No current facility-administered medications for this encounter.     Physical Findings:  height is 5' 4" (1.626 m) and weight is 162 lb 6.4 oz (73.7 kg). Her temperature is 97.7 F (36.5 C). Her blood pressure is 119/71 and her pulse is 68. Her oxygen saturation is 100%. .     General: Alert and oriented, in no acute distress HEENT: Oropharynx and oral cavity are clear. Neck: Neck is supple, no palpable cervical or supraclavicular masses. Heart: Regular in rate and rhythm with no murmurs. Chest: Clear to auscultation bilaterally, with no rhonchi, wheezes, or rales. Abdomen: Soft and nontender. Skin: Skin looks excellent in the surgical area and is  well healed. Extremities: No edema in upper or lower extremities. Lymphatics: see Neck Exam Neurologic: No obvious focalities. Speech is fluent.  Psychiatric: Judgment and insight are intact. Affect is appropriate. Breast :On examination of the left breast, no palpable masses in the breast or axilla noted. On examination of the right breast, right axillary and right upper-outer quadrant lumpectomy scars have healed well with no drainage. No palpable breast or axillary masses noted.   Lab Findings: Lab Results  Component Value Date   WBC 7.0 01/26/2016   HGB 13.7 01/26/2016   HCT  41.5 01/26/2016   MCV 88.5 01/26/2016   PLT 257 01/26/2016    CMP     Component Value Date/Time   NA 141 01/26/2016 1550   K 4.7 01/26/2016 1550   CO2 28 01/26/2016 1550   GLUCOSE 106 01/26/2016 1550   BUN 13.7 01/26/2016 1550   CREATININE 0.7 01/26/2016 1550   CALCIUM 9.8 01/26/2016 1550   PROT 7.6 01/26/2016 1550   ALBUMIN 3.9 01/26/2016 1550   AST 16 01/26/2016 1550   ALT 16 01/26/2016 1550   ALKPHOS 94 01/26/2016 1550   BILITOT 0.36 01/26/2016 1550     Radiographic Findings: No results found.  Impression/Plan: Stage I right breast cancer  We discussed adjuvant radiotherapy today.  I recommend a 4 week course of radiation therapy to the right breast. We discussed the reasons and randomized data behind using this accelerated technique for the patient, including convenience for the patient. The risks, benefits and side effects of this treatment were discussed in detail.  She understands that radiotherapy is associated with skin irritation and fatigue in the acute setting. Late effects can include cosmetic changes and rare injury to internal organs.   She is enthusiastic about proceeding with 4 weeks of treatment. A consent form has been signed and placed in her chart.   All questions were answered to her satisfaction today.  The patient has a trip planned from 03/18/16 through mid January, and plans to start radiation after her trip. She will have a CT Simulation today.  _____________________________________   Eppie Gibson, MD  This document serves as a record of services personally performed by Eppie Gibson, MD. It was created on her behalf by Maryla Morrow, a trained medical scribe. The creation of this record is based on the scribe's personal observations and the provider's statements to them. This document has been checked and approved by the attending provider.

## 2016-03-12 NOTE — Progress Notes (Signed)
error 

## 2016-03-24 DIAGNOSIS — C50411 Malignant neoplasm of upper-outer quadrant of right female breast: Secondary | ICD-10-CM | POA: Diagnosis not present

## 2016-04-19 ENCOUNTER — Ambulatory Visit
Admission: RE | Admit: 2016-04-19 | Discharge: 2016-04-19 | Disposition: A | Discharge status: Home / Self Care | Payer: PRIVATE HEALTH INSURANCE | Source: Ambulatory Visit | Attending: Radiation Oncology | Admitting: Radiation Oncology

## 2016-04-19 DIAGNOSIS — C50411 Malignant neoplasm of upper-outer quadrant of right female breast: Secondary | ICD-10-CM

## 2016-04-19 DIAGNOSIS — Z170 Estrogen receptor positive status [ER+]: Secondary | ICD-10-CM | POA: Diagnosis not present

## 2016-04-19 MED ORDER — RADIAPLEXRX EX GEL
Freq: Once | CUTANEOUS | Status: AC
  Administered 2016-04-19: 10:00:00 via TOPICAL

## 2016-04-19 MED ORDER — ALRA NON-METALLIC DEODORANT (RAD-ONC)
1.0000 "application " | Freq: Once | TOPICAL | Status: AC
  Administered 2016-04-19: 1 via TOPICAL

## 2016-04-19 NOTE — Progress Notes (Signed)

## 2016-04-20 ENCOUNTER — Ambulatory Visit
Admission: RE | Admit: 2016-04-20 | Discharge: 2016-04-20 | Disposition: A | Discharge status: Home / Self Care | Payer: PRIVATE HEALTH INSURANCE | Source: Ambulatory Visit | Attending: Radiation Oncology | Admitting: Radiation Oncology

## 2016-04-20 DIAGNOSIS — C50411 Malignant neoplasm of upper-outer quadrant of right female breast: Secondary | ICD-10-CM | POA: Diagnosis not present

## 2016-04-21 ENCOUNTER — Ambulatory Visit
Admission: RE | Admit: 2016-04-21 | Discharge: 2016-04-21 | Disposition: A | Discharge status: Home / Self Care | Payer: PRIVATE HEALTH INSURANCE | Source: Ambulatory Visit | Attending: Radiation Oncology | Admitting: Radiation Oncology

## 2016-04-21 DIAGNOSIS — C50411 Malignant neoplasm of upper-outer quadrant of right female breast: Secondary | ICD-10-CM | POA: Diagnosis not present

## 2016-04-22 ENCOUNTER — Ambulatory Visit
Admission: RE | Admit: 2016-04-22 | Discharge: 2016-04-22 | Disposition: A | Discharge status: Home / Self Care | Payer: PRIVATE HEALTH INSURANCE | Source: Ambulatory Visit | Attending: Radiation Oncology | Admitting: Radiation Oncology

## 2016-04-22 DIAGNOSIS — C50411 Malignant neoplasm of upper-outer quadrant of right female breast: Secondary | ICD-10-CM | POA: Diagnosis not present

## 2016-04-23 ENCOUNTER — Ambulatory Visit
Admission: RE | Admit: 2016-04-23 | Discharge: 2016-04-23 | Disposition: A | Discharge status: Home / Self Care | Payer: PRIVATE HEALTH INSURANCE | Source: Ambulatory Visit | Attending: Radiation Oncology | Admitting: Radiation Oncology

## 2016-04-23 DIAGNOSIS — C50411 Malignant neoplasm of upper-outer quadrant of right female breast: Secondary | ICD-10-CM | POA: Diagnosis not present

## 2016-04-26 ENCOUNTER — Ambulatory Visit
Admission: RE | Admit: 2016-04-26 | Discharge: 2016-04-26 | Disposition: A | Discharge status: Home / Self Care | Payer: PRIVATE HEALTH INSURANCE | Source: Ambulatory Visit | Attending: Radiation Oncology | Admitting: Radiation Oncology

## 2016-04-26 ENCOUNTER — Encounter: Payer: Self-pay | Admitting: Radiation Oncology

## 2016-04-26 VITALS — BP 124/70 | HR 76 | Temp 97.9°F | Ht 64.0 in | Wt 164.8 lb

## 2016-04-26 DIAGNOSIS — Z170 Estrogen receptor positive status [ER+]: Secondary | ICD-10-CM | POA: Insufficient documentation

## 2016-04-26 DIAGNOSIS — Z510 Encounter for antineoplastic radiation therapy: Secondary | ICD-10-CM | POA: Diagnosis present

## 2016-04-26 DIAGNOSIS — C50411 Malignant neoplasm of upper-outer quadrant of right female breast: Secondary | ICD-10-CM | POA: Insufficient documentation

## 2016-04-26 NOTE — Progress Notes (Signed)
   Weekly Management Note:  Outpatient    ICD-9-CM ICD-10-CM   1. Malignant neoplasm of upper-outer quadrant of right breast in female, estrogen receptor positive ([facility]) 174.4 C50.411    V86.0 Z17.0     Current Dose:  13.35 Gy  Projected Dose: 50.05 Gy   Narrative:  The patient presents for routine under treatment assessment.  CBCT/MVCT images/Port film x-rays were reviewed.  The chart was checked. Doing well. No pain or fatigue.  Physical Findings:  height is 5\' 4"  (1.626 m) and weight is 164 lb 12.8 oz (74.8 kg). Her temperature is 97.9 F (36.6 C). Her blood pressure is 124/70 and her pulse is 76. Her oxygen saturation is 99%.   Wt Readings from Last 3 Encounters:  04/26/16 164 lb 12.8 oz (74.8 kg)  03/12/16 162 lb 6.4 oz (73.7 kg)  02/10/16 159 lb 6 oz (72.3 kg)   Mild erythema, right breast.  Impression:  The patient is tolerating radiotherapy.  Plan:  Continue radiotherapy as planned. Patient instructed to apply Biafine to intact skin in treatment fields.    ________________________________   Eppie Gibson, M.D.

## 2016-04-26 NOTE — Progress Notes (Signed)
Ms. Funai is here for her 5th fraction of radiation to her Right Breast. She denies pain or fatigue. She reports tingling to her Right Breast at times. She also has some soreness underneath her Right Breast. Her Right Breast is slightly pink, and she is using the Radiaplex twice daily.   BP 124/70   Pulse 76   Temp 97.9 F (36.6 C)   Ht 5\' 4"  (1.626 m)   Wt 164 lb 12.8 oz (74.8 kg)   LMP 05/04/2011   SpO2 99% Comment: room air  BMI 28.29 kg/m    Wt Readings from Last 3 Encounters:  04/26/16 164 lb 12.8 oz (74.8 kg)  03/12/16 162 lb 6.4 oz (73.7 kg)  02/10/16 159 lb 6 oz (72.3 kg)

## 2016-04-27 ENCOUNTER — Ambulatory Visit
Admission: RE | Admit: 2016-04-27 | Discharge: 2016-04-27 | Disposition: A | Discharge status: Home / Self Care | Payer: PRIVATE HEALTH INSURANCE | Source: Ambulatory Visit | Attending: Radiation Oncology | Admitting: Radiation Oncology

## 2016-04-27 DIAGNOSIS — Z510 Encounter for antineoplastic radiation therapy: Secondary | ICD-10-CM | POA: Diagnosis not present

## 2016-04-28 ENCOUNTER — Ambulatory Visit
Admission: RE | Admit: 2016-04-28 | Discharge: 2016-04-28 | Disposition: A | Discharge status: Home / Self Care | Payer: PRIVATE HEALTH INSURANCE | Source: Ambulatory Visit | Attending: Radiation Oncology | Admitting: Radiation Oncology

## 2016-04-28 DIAGNOSIS — Z510 Encounter for antineoplastic radiation therapy: Secondary | ICD-10-CM | POA: Diagnosis not present

## 2016-04-29 ENCOUNTER — Ambulatory Visit
Admission: RE | Admit: 2016-04-29 | Discharge: 2016-04-29 | Disposition: A | Discharge status: Home / Self Care | Payer: PRIVATE HEALTH INSURANCE | Source: Ambulatory Visit | Attending: Radiation Oncology | Admitting: Radiation Oncology

## 2016-04-29 DIAGNOSIS — Z510 Encounter for antineoplastic radiation therapy: Secondary | ICD-10-CM | POA: Diagnosis not present

## 2016-04-30 ENCOUNTER — Ambulatory Visit
Admission: RE | Admit: 2016-04-30 | Discharge: 2016-04-30 | Disposition: A | Discharge status: Home / Self Care | Payer: PRIVATE HEALTH INSURANCE | Source: Ambulatory Visit | Attending: Radiation Oncology | Admitting: Radiation Oncology

## 2016-04-30 DIAGNOSIS — Z510 Encounter for antineoplastic radiation therapy: Secondary | ICD-10-CM | POA: Diagnosis not present

## 2016-05-03 ENCOUNTER — Ambulatory Visit
Admission: RE | Admit: 2016-05-03 | Discharge: 2016-05-03 | Disposition: A | Discharge status: Home / Self Care | Payer: PRIVATE HEALTH INSURANCE | Source: Ambulatory Visit | Attending: Radiation Oncology | Admitting: Radiation Oncology

## 2016-05-03 ENCOUNTER — Encounter: Payer: Self-pay | Admitting: Radiation Oncology

## 2016-05-03 VITALS — BP 113/70 | HR 77 | Temp 98.0°F | Ht 64.0 in | Wt 168.2 lb

## 2016-05-03 DIAGNOSIS — C50411 Malignant neoplasm of upper-outer quadrant of right female breast: Secondary | ICD-10-CM

## 2016-05-03 DIAGNOSIS — Z510 Encounter for antineoplastic radiation therapy: Secondary | ICD-10-CM | POA: Diagnosis not present

## 2016-05-03 DIAGNOSIS — Z170 Estrogen receptor positive status [ER+]: Principal | ICD-10-CM

## 2016-05-03 NOTE — Progress Notes (Signed)
   Weekly Management Note:  Outpatient    ICD-9-CM ICD-10-CM   1. Malignant neoplasm of upper-outer quadrant of right breast in female, estrogen receptor positive ([facility]) 174.4 C50.411    V86.0 Z17.0     Current Dose:  26.7 Gy  Projected Dose: 50.05 Gy   Narrative:  The patient presents for routine under treatment assessment.  CBCT/MVCT images/Port film x-rays were reviewed.  The chart was checked. Doing well. No pain or fatigue. No questions today. Applies Radiaplex BID.  Physical Findings:  height is 5\' 4"  (1.626 m) and weight is 168 lb 3.2 oz (76.3 kg). Her temperature is 98 F (36.7 C). Her blood pressure is 113/70 and her pulse is 77. Her oxygen saturation is 100%.   Wt Readings from Last 3 Encounters:  05/03/16 168 lb 3.2 oz (76.3 kg)  04/26/16 164 lb 12.8 oz (74.8 kg)  03/12/16 162 lb 6.4 oz (73.7 kg)   Mild erythema, right breast.  Impression:  The patient is tolerating radiotherapy.  Plan:  Continue radiotherapy as planned. Patient instructed to continue to apply Radiaplex BID in treatment fields.    ________________________________   Eppie Gibson, M.D.

## 2016-05-03 NOTE — Progress Notes (Signed)
Julie Mack is here for her 10th fraction of radiation to her Right Breast. She denies pain. Her Right Breast is red and slightly swollen. She is using the Radiaplex cream twice daily as directed. She denies fatigue.   BP 113/70   Pulse 77   Temp 98 F (36.7 C)   Ht 5\' 4"  (1.626 m)   Wt 168 lb 3.2 oz (76.3 kg)   LMP 05/04/2011   SpO2 100% Comment: room air  BMI 28.87 kg/m    Wt Readings from Last 3 Encounters:  05/03/16 168 lb 3.2 oz (76.3 kg)  04/26/16 164 lb 12.8 oz (74.8 kg)  03/12/16 162 lb 6.4 oz (73.7 kg)

## 2016-05-04 ENCOUNTER — Ambulatory Visit
Admission: RE | Admit: 2016-05-04 | Discharge: 2016-05-04 | Disposition: A | Discharge status: Home / Self Care | Payer: PRIVATE HEALTH INSURANCE | Source: Ambulatory Visit | Attending: Radiation Oncology | Admitting: Radiation Oncology

## 2016-05-04 DIAGNOSIS — Z510 Encounter for antineoplastic radiation therapy: Secondary | ICD-10-CM | POA: Diagnosis not present

## 2016-05-05 ENCOUNTER — Ambulatory Visit
Admission: RE | Admit: 2016-05-05 | Discharge: 2016-05-05 | Disposition: A | Discharge status: Home / Self Care | Payer: PRIVATE HEALTH INSURANCE | Source: Ambulatory Visit | Attending: Radiation Oncology | Admitting: Radiation Oncology

## 2016-05-05 DIAGNOSIS — Z510 Encounter for antineoplastic radiation therapy: Secondary | ICD-10-CM | POA: Diagnosis not present

## 2016-05-06 ENCOUNTER — Ambulatory Visit
Admission: RE | Admit: 2016-05-06 | Discharge: 2016-05-06 | Disposition: A | Discharge status: Home / Self Care | Payer: PRIVATE HEALTH INSURANCE | Source: Ambulatory Visit | Attending: Radiation Oncology | Admitting: Radiation Oncology

## 2016-05-06 DIAGNOSIS — Z510 Encounter for antineoplastic radiation therapy: Secondary | ICD-10-CM | POA: Diagnosis not present

## 2016-05-07 ENCOUNTER — Ambulatory Visit
Admission: RE | Admit: 2016-05-07 | Discharge: 2016-05-07 | Disposition: A | Discharge status: Home / Self Care | Payer: PRIVATE HEALTH INSURANCE | Source: Ambulatory Visit | Attending: Radiation Oncology | Admitting: Radiation Oncology

## 2016-05-07 ENCOUNTER — Encounter: Payer: Self-pay | Admitting: Radiation Oncology

## 2016-05-07 DIAGNOSIS — Z510 Encounter for antineoplastic radiation therapy: Secondary | ICD-10-CM | POA: Diagnosis not present

## 2016-05-10 ENCOUNTER — Encounter: Payer: Self-pay | Admitting: Radiation Oncology

## 2016-05-10 ENCOUNTER — Ambulatory Visit
Admission: RE | Admit: 2016-05-10 | Discharge: 2016-05-10 | Disposition: A | Discharge status: Home / Self Care | Payer: PRIVATE HEALTH INSURANCE | Source: Ambulatory Visit | Attending: Radiation Oncology | Admitting: Radiation Oncology

## 2016-05-10 VITALS — BP 120/72 | HR 77 | Temp 98.1°F | Ht 64.0 in | Wt 168.6 lb

## 2016-05-10 DIAGNOSIS — Z170 Estrogen receptor positive status [ER+]: Principal | ICD-10-CM

## 2016-05-10 DIAGNOSIS — Z510 Encounter for antineoplastic radiation therapy: Secondary | ICD-10-CM | POA: Diagnosis not present

## 2016-05-10 DIAGNOSIS — C50411 Malignant neoplasm of upper-outer quadrant of right female breast: Secondary | ICD-10-CM

## 2016-05-10 NOTE — Progress Notes (Signed)
   Weekly Management Note:  Outpatient    ICD-9-CM ICD-10-CM   1. Malignant neoplasm of upper-outer quadrant of right breast in female, estrogen receptor positive ([facility]) 174.4 C50.411    V86.0 Z17.0     Current Dose:  40.05 Gy  Projected Dose: 50.05 Gy   Narrative:  The patient presents for routine under treatment assessment.  CBCT/MVCT images/Port film x-rays were reviewed.  The chart was checked. Doing well. Mild itching over breast.  Physical Findings:  height is 5\' 4"  (1.626 m) and weight is 168 lb 9.6 oz (76.5 kg). Her temperature is 98.1 F (36.7 C). Her blood pressure is 120/72 and her pulse is 77. Her oxygen saturation is 100%.   Wt Readings from Last 3 Encounters:  05/10/16 168 lb 9.6 oz (76.5 kg)  05/03/16 168 lb 3.2 oz (76.3 kg)  04/26/16 164 lb 12.8 oz (74.8 kg)   Moderate erythema, right breast. Skin intact.  Impression:  The patient is tolerating radiotherapy.  Plan:  Continue radiotherapy as planned. Patient instructed to continue to apply Radiaplex  in treatment fields and hydrocortisone 1% cream prn itching    ________________________________   Eppie Gibson, M.D.

## 2016-05-10 NOTE — Progress Notes (Signed)
Ms. Erby presents for her 15th fraction of radiation to her Right Breast. She denies pain. She does have some mild fatigue with some decreased energy. Her Right Breast, Right Axilla, and underneath her Right Breast are red. She has an area of itching and redness to the upper inner quadrant of her Right Breast. She has used hydrocortisone to this area, and also continues to use Radiaplex twice daily to her Right Breast areas.   BP 120/72   Pulse 77   Temp 98.1 F (36.7 C)   Ht 5\' 4"  (1.626 m)   Wt 168 lb 9.6 oz (76.5 kg)   LMP 05/04/2011   SpO2 100% Comment: room air  BMI 28.94 kg/m    Wt Readings from Last 3 Encounters:  05/10/16 168 lb 9.6 oz (76.5 kg)  05/03/16 168 lb 3.2 oz (76.3 kg)  04/26/16 164 lb 12.8 oz (74.8 kg)

## 2016-05-11 ENCOUNTER — Ambulatory Visit
Admission: RE | Admit: 2016-05-11 | Discharge: 2016-05-11 | Disposition: A | Discharge status: Home / Self Care | Payer: PRIVATE HEALTH INSURANCE | Source: Ambulatory Visit | Attending: Radiation Oncology | Admitting: Radiation Oncology

## 2016-05-11 DIAGNOSIS — Z510 Encounter for antineoplastic radiation therapy: Secondary | ICD-10-CM | POA: Diagnosis not present

## 2016-05-12 ENCOUNTER — Ambulatory Visit
Admission: RE | Admit: 2016-05-12 | Discharge: 2016-05-12 | Disposition: A | Discharge status: Home / Self Care | Payer: PRIVATE HEALTH INSURANCE | Source: Ambulatory Visit | Attending: Radiation Oncology | Admitting: Radiation Oncology

## 2016-05-12 DIAGNOSIS — Z510 Encounter for antineoplastic radiation therapy: Secondary | ICD-10-CM | POA: Diagnosis not present

## 2016-05-13 ENCOUNTER — Ambulatory Visit
Admission: RE | Admit: 2016-05-13 | Discharge: 2016-05-13 | Disposition: A | Discharge status: Home / Self Care | Payer: PRIVATE HEALTH INSURANCE | Source: Ambulatory Visit | Attending: Radiation Oncology | Admitting: Radiation Oncology

## 2016-05-13 DIAGNOSIS — Z510 Encounter for antineoplastic radiation therapy: Secondary | ICD-10-CM | POA: Diagnosis not present

## 2016-05-14 ENCOUNTER — Ambulatory Visit
Admission: RE | Admit: 2016-05-14 | Discharge: 2016-05-14 | Disposition: A | Discharge status: Home / Self Care | Payer: PRIVATE HEALTH INSURANCE | Source: Ambulatory Visit | Attending: Radiation Oncology | Admitting: Radiation Oncology

## 2016-05-14 DIAGNOSIS — Z510 Encounter for antineoplastic radiation therapy: Secondary | ICD-10-CM | POA: Diagnosis not present

## 2016-05-17 ENCOUNTER — Ambulatory Visit
Admission: RE | Admit: 2016-05-17 | Discharge: 2016-05-17 | Disposition: A | Discharge status: Home / Self Care | Payer: PRIVATE HEALTH INSURANCE | Source: Ambulatory Visit | Attending: Radiation Oncology | Admitting: Radiation Oncology

## 2016-05-17 ENCOUNTER — Telehealth: Payer: Self-pay | Admitting: *Deleted

## 2016-05-17 ENCOUNTER — Encounter: Payer: Self-pay | Admitting: Radiation Oncology

## 2016-05-17 VITALS — BP 129/71 | HR 82 | Temp 98.4°F | Resp 10 | Wt 170.0 lb

## 2016-05-17 DIAGNOSIS — C50411 Malignant neoplasm of upper-outer quadrant of right female breast: Secondary | ICD-10-CM | POA: Diagnosis present

## 2016-05-17 DIAGNOSIS — Z170 Estrogen receptor positive status [ER+]: Secondary | ICD-10-CM | POA: Diagnosis not present

## 2016-05-17 DIAGNOSIS — Z510 Encounter for antineoplastic radiation therapy: Secondary | ICD-10-CM | POA: Diagnosis not present

## 2016-05-17 MED ORDER — RADIAPLEXRX EX GEL
Freq: Once | CUTANEOUS | Status: AC
  Administered 2016-05-17: 09:00:00 via TOPICAL

## 2016-05-17 NOTE — Telephone Encounter (Signed)
  Oncology Nurse Navigator Documentation  Navigator Location: [facility] (05/17/16 1400)   )Navigator Encounter Type: Telephone (05/17/16 1400) Telephone: [facility] Call (05/17/16 1400)  Called pt to congratulate on completion of xrt. Relate doing well and without complaints. Encourage pt to call with needs. Received verbal understanding.                  Patient Visit Type: RadOnc (05/17/16 1400) Treatment Phase: Final Radiation Tx (05/17/16 1400) Barriers/Navigation Needs: No barriers at this time;No Questions;No Needs (05/17/16 1400)   Interventions: Referrals (05/17/16 1400) Referrals: Survivorship (05/17/16 1400)          Acuity: Level 1 (05/17/16 1400)         Time Spent with Patient: 15 (05/17/16 1400)

## 2016-05-17 NOTE — Progress Notes (Signed)
PAIN: She rates her pain as a 3 on a scale of 0-10. intermittent and burning over right breast. SKIN: Pt right breast- positive for Pruritus, erythema, breast tenderness and dry desquamation.  Pt denies edema .  Pt continues to apply Radiaplex and Hydrocortisone as directed. OTHER: EOT 05/17/16-follow up card (one month given) additional tube of Radiaplex given.  BP 129/71   Pulse 82   Temp 98.4 F (36.9 C) (Oral)   Resp 10   Wt 170 lb (77.1 kg)   LMP 05/04/2011   SpO2 99%   BMI 29.18 kg/m  Wt Readings from Last 3 Encounters:  05/17/16 170 lb (77.1 kg)  05/10/16 168 lb 9.6 oz (76.5 kg)  05/03/16 168 lb 3.2 oz (76.3 kg)

## 2016-05-17 NOTE — Progress Notes (Signed)
   Weekly Management Note:  Outpatient    ICD-9-CM ICD-10-CM   1. Malignant neoplasm of upper-outer quadrant of right breast in female, estrogen receptor positive (HCC) 174.4 C50.411 hyaluronate sodium (RADIAPLEXRX) gel   V86.0 Z17.0     Current Dose:  50.05 Gy  Projected Dose: 50.05 Gy   Narrative:  The patient presents for routine under treatment assessment.  CBCT/MVCT images/Port film x-rays were reviewed.  The chart was checked. Doing well. Still has itching over breast and especially at right IM fold.  Physical Findings:  weight is 170 lb (77.1 kg). Her oral temperature is 98.4 F (36.9 C). Her blood pressure is 129/71 and her pulse is 82. Her respiration is 10 and oxygen saturation is 99%.   Wt Readings from Last 3 Encounters:  05/17/16 170 lb (77.1 kg)  05/10/16 168 lb 9.6 oz (76.5 kg)  05/03/16 168 lb 3.2 oz (76.3 kg)   Moderate erythema, right breast. Skin dry in right axilla. Superficial punctate peeling at IM fold with bright erythema  Impression:  The patient tolerated radiotherapy.  Plan:  Patient instructed to continue to apply Radiaplex  in treatment fields and hydrocortisone 1% cream prn itching .  Apply neosporin to peeling areas. F/u in 70mo, sooner if needed. Card given today.   ________________________________   Eppie Gibson, M.D.

## 2016-05-26 NOTE — Progress Notes (Signed)
  Radiation Oncology         [phone] ________________________________  Name: Julie Mack MRN: [id number]  Date: 05/17/2016  DOB: [birth year]  End of Treatment Note  Diagnosis:   Stage I  pathologic T1cN0  clinical M0 Right Breast UOQ Invasive Ductal Carcinoma, ER+ / PR+ / Her2-, Grade I     Indication for treatment:  Curative       Radiation treatment dates:   04/20/2016 to 05/17/2016  Site/dose:    1. The Right breast was treated to 40.05 Gy in 15 fractions at 2.67 Gy per fraction. 2. The Right breast was boosted to 10 Gy in 5 fractions at 2 Gy per fraction.  Beams/energy:    1. 3D // 10X, 6X 2. Isodose plan // 10X, 15X  Narrative: The patient tolerated radiation treatment relatively well.   She experienced itching over the breast and especially at the right inframammary fold. She developed moderate erythema of the right breast with dry skin in the right axilla. There was also superficial punctate peeling at the inframammary fold with bright erythema.  Plan: The patient has completed radiation treatment. Patient was instructed to continue to apply Radiaplex in treatment fields and hydrocortisone 1% cream as needed for itching. She was also advised to apply neosporin to peeling areas. The patient will return to radiation oncology clinic for routine followup in one month. I advised them to call or return sooner if they have any questions or concerns related to their recovery or treatment.  -----------------------------------  Eppie Gibson, MD   This document serves as a record of services personally performed by Eppie Gibson, MD. It was created on her behalf by Arlyce Harman, a trained medical scribe. The creation of this record is based on the scribe's personal observations and the provider's statements to them. This document has been checked and approved by the attending provider.

## 2016-06-01 ENCOUNTER — Other Ambulatory Visit: Payer: PRIVATE HEALTH INSURANCE

## 2016-06-01 ENCOUNTER — Ambulatory Visit: Payer: PRIVATE HEALTH INSURANCE | Admitting: Oncology

## 2016-06-03 ENCOUNTER — Other Ambulatory Visit (HOSPITAL_BASED_OUTPATIENT_CLINIC_OR_DEPARTMENT_OTHER): Payer: PRIVATE HEALTH INSURANCE

## 2016-06-03 ENCOUNTER — Ambulatory Visit (HOSPITAL_BASED_OUTPATIENT_CLINIC_OR_DEPARTMENT_OTHER): Payer: PRIVATE HEALTH INSURANCE | Admitting: Oncology

## 2016-06-03 VITALS — BP 133/67 | HR 65 | Temp 98.4°F | Resp 18 | Ht 64.0 in | Wt 173.3 lb

## 2016-06-03 DIAGNOSIS — C50411 Malignant neoplasm of upper-outer quadrant of right female breast: Secondary | ICD-10-CM

## 2016-06-03 DIAGNOSIS — Z170 Estrogen receptor positive status [ER+]: Principal | ICD-10-CM

## 2016-06-03 LAB — CBC WITH DIFFERENTIAL/PLATELET
BASO%: 0.7 % (ref 0.0–2.0)
BASOS ABS: 0 10*3/uL (ref 0.0–0.1)
EOS%: 1.9 % (ref 0.0–7.0)
Eosinophils Absolute: 0.1 10*3/uL (ref 0.0–0.5)
HEMATOCRIT: 38.3 % (ref 34.8–46.6)
HGB: 12.9 g/dL (ref 11.6–15.9)
LYMPH#: 1.1 10*3/uL (ref 0.9–3.3)
LYMPH%: 20.2 % (ref 14.0–49.7)
MCH: 29.6 pg (ref 25.1–34.0)
MCHC: 33.6 g/dL (ref 31.5–36.0)
MCV: 88.1 fL (ref 79.5–101.0)
MONO#: 0.5 10*3/uL (ref 0.1–0.9)
MONO%: 9.1 % (ref 0.0–14.0)
NEUT#: 3.6 10*3/uL (ref 1.5–6.5)
NEUT%: 68.1 % (ref 38.4–76.8)
Platelets: 200 10*3/uL (ref 145–400)
RBC: 4.34 10*6/uL (ref 3.70–5.45)
RDW: 13 % (ref 11.2–14.5)
WBC: 5.3 10*3/uL (ref 3.9–10.3)

## 2016-06-03 LAB — COMPREHENSIVE METABOLIC PANEL
ALT: 18 U/L (ref 0–55)
ANION GAP: 7 meq/L (ref 3–11)
AST: 17 U/L (ref 5–34)
Albumin: 3.9 g/dL (ref 3.5–5.0)
Alkaline Phosphatase: 62 U/L (ref 40–150)
BUN: 10.7 mg/dL (ref 7.0–26.0)
CALCIUM: 9.1 mg/dL (ref 8.4–10.4)
CHLORIDE: 105 meq/L (ref 98–109)
CO2: 28 mEq/L (ref 22–29)
Creatinine: 0.6 mg/dL (ref 0.6–1.1)
EGFR: >90 mL/min/{1.73_m2} (ref >90)
Glucose: 97 mg/dl (ref 70–140)
POTASSIUM: 4.4 meq/L (ref 3.5–5.1)
Sodium: 141 mEq/L (ref 136–145)
Total Bilirubin: 0.44 mg/dL (ref 0.20–1.20)
Total Protein: 7 g/dL (ref 6.4–8.3)

## 2016-06-03 NOTE — Progress Notes (Signed)
[place]  [phone] [phone]     ID: MERISSA RENWICK DOB: December 18, 1955  MR#: [id number]  AJG#:[id number]  Patient Care Team: Marton Redwood, MD as PCP - General (Internal Medicine) Fanny Skates, MD as Consulting Physician (General Surgery) Eppie Gibson, MD as Attending Physician (Radiation Oncology) Rolm Bookbinder, MD as Consulting Physician (Dermatology) Ronald Lobo, MD as Consulting Physician (Gastroenterology) Chauncey Cruel, MD OTHER MD:  CHIEF COMPLAINT: Estrogen receptor positive breast cancer  CURRENT TREATMENT:    BREAST CANCER HISTORY: From the original intake note:  Kiernan undergoes high risk breast cancer screening because of a significant family history and on 12/28/2015 bilateral breast MRI found a 6 mm enhancing mass in the upper outer quadrant of the right breast there were no other abnormalities including no left breast findings and no adenopathy. Biopsy of the right breast mass in question documented invasive ductal carcinoma, grade 1, estrogen receptor 100% positive, with strong staining intensity, progesterone receptor 30% positive, with moderate staining intensity, with an MIB-1 of 10%, and no HER-2 amplification, the signals ratio being 0.72 and the number per cell 1.15. A second suspicious area also in the upper-outer quadrant was biopsied at the same time and showed only usual ductal hyperplasia.  Her subsequent history is as detailed below.   INTERVAL HISTORY: Laketa returns today for follow-up of her estrogen receptor positive breast cancer accompanied by her husband Simona Huh. After her last visit here she underwent right lumpectomy and sentinel lymph node sampling. This was in 02/10/2016. This showed a 1.7 cm invasive ductal carcinoma, grade 1, with all 3 sentinel lymph nodes negative. Margins were negative as well  We obtained an Oncotype test and this tissue and it showed a recurrent score of 14 predicting a 10 year  risk of recurrence outside the breast of 9% if the patient's only systemic therapy is tamoxifen for 5 years. It also predicted no benefit from chemotherapy.  The patient then proceeded to radiation which she completed 2 weeks ago.  Since her last visit here she also had genetics testing which was negative.  She started tamoxifen 02/04/2016. She tolerates this well, with mild hot flashes, and mild vaginal discharge. She obtains the drug at a good price.  REVIEW OF SYSTEMS: Ellanore did well with her surgery, without significant pain, bleeding, or fever problems. She also did very well with radiation, although she had some peeling. The skin changes have resolved. Fatigue was minimal. She is pretty much back to baseline and exercises at least 3 times a week, with walking on weekends. A detailed review of systems today was otherwise stable  PAST MEDICAL HISTORY: Past Medical History:  Diagnosis Date  . Asthma    allergy induced asthma as child, none now  . Breast cancer ([place]) 01/2016   right breast cancer  . Family history of breast cancer   . Family history of ovarian cancer   . Headache   . Melanoma ([place])    in situ  . Osteoporosis     PAST SURGICAL HISTORY: Past Surgical History:  Procedure Laterality Date  . ANKLE FRACTURE SURGERY Right 2015  . BREAST LUMPECTOMY WITH RADIOACTIVE SEED AND SENTINEL LYMPH NODE BIOPSY Right 02/10/2016   Procedure: RIGHT BREAST LUMPECTOMY WITH RADIOACTIVE SEED AND RIGHT SENTINEL LYMPH NODE BIOPSY WITH INJECT BLUE DYE;  Surgeon: Fanny Skates, MD;  Location: [facility];  Service: General;  Laterality: Right;  . DILATION AND CURETTAGE OF UTERUS    . [place]  .  EYE SURGERY  1967   cyst removed  . TONSILLECTOMY  1966    FAMILY HISTORY Family History  Problem Relation Age of Onset  . Heart attack Mother   . Cancer - Lung Father   . Breast cancer Sister 28  . Ovarian cancer Maternal Grandmother 32  . Stroke  Maternal Grandfather   . Stroke Paternal Grandfather   . Breast cancer Other     MGMs sister dx under 59  . Breast cancer Other     MGMs sister dx over 52  . Breast cancer Other     PGMs sister  As of October 2017 Naiya's parents are still living, in their early 35s. Her father had lung cancer diagnosed at age 74 (essentially a nonsmoker); a maternal grandmother had ovarian cancer at age 47 the patient has 2 brothers, 2 sisters, with one sister diagnosed with breast cancer at age 87("and doing fine").  GYNECOLOGIC HISTORY:  Patient's last menstrual period was 05/04/2011. Energy age 73, first live birth age 7, menopause age 39, on vaginal estrogen cream until October 2017. 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 [name] is Agricultural consultant in [place]. Their daughter [name] is interested in literature in [name].    ADVANCED DIRECTIVES: In place   HEALTH MAINTENANCE: Social History  Substance Use Topics  . Smoking status: Never Smoker  . Smokeless tobacco: Never Used  . Alcohol use 0.0 oz/week     Comment: 2 glasses wine a day     Colonoscopy:  PAP:  Bone density:   Allergies  Allergen Reactions  . Gluten Meal Nausea Only    Current Outpatient Prescriptions  Medication Sig Dispense Refill  . betamethasone valerate ointment (VALISONE) 0.1 %     . emollient (RADIAGEL) gel Apply topically as needed for wound care.    . fluorouracil (EFUDEX) 5 % cream Apply topically as needed.    . folic acid (FOLVITE) 166 MCG tablet Take 400 mcg by mouth daily.    Marland Kitchen glucosamine-chondroitin 500-400 MG tablet Take 1 tablet by mouth every morning.    . Multiple Vitamins-Minerals (MULTI COMPLETE PO) Take by mouth.    . non-metallic deodorant Jethro Poling) MISC Apply 1 application topically daily as needed.    . tamoxifen (NOLVADEX) 20 MG tablet Take 20 mg by mouth daily.    .  traMADol (ULTRAM) 50 MG tablet Take 1-2 tablets (50-100 mg total) by mouth every 6 (six) hours as needed. (Patient not taking: Reported on 04/26/2016) 30 tablet 0  . UNABLE TO FIND every morning. Med Name: calcium 600 mg    . UNABLE TO FIND every morning. Med Name: vitamin D    . UNABLE TO FIND every morning. Med Name: flax oil    . valACYclovir (VALTREX) 1000 MG tablet Take 1,000 mg by mouth 2 (two) times daily. 01/08/15 as needed for pain    . vitamin C (ASCORBIC ACID) 500 MG tablet Take 500 mg by mouth daily.     No current facility-administered medications for this visit.     OBJECTIVE: Middle-aged white woman Who appears well Vitals:   06/03/16 1158  BP: 133/67  Pulse: 65  Resp: 18  Temp: 98.4 F (36.9 C)     Body mass index is 29.75 kg/m.    ECOG FS:0 - Asymptomatic  Sclerae unicteric, pupils round and equal Oropharynx clear and moist-- no thrush or other  lesions No cervical or supraclavicular adenopathy Lungs no rales or rhonchi Heart regular rate and rhythm Abd soft, nontender, positive bowel sounds MSK no focal spinal tenderness, no upper extremity lymphedema Neuro: nonfocal, well oriented, appropriate affect Breasts: There is right breast is status post lumpectomy and radiation. The cosmetic result is excellent. There is no evidence of local recurrence. The left breast is unremarkable. Both axillae are benign.   LAB RESULTS:  CMP     Component Value Date/Time   NA 141 06/03/2016 1144   K 4.4 06/03/2016 1144   CO2 28 06/03/2016 1144   GLUCOSE 97 06/03/2016 1144   BUN 10.7 06/03/2016 1144   CREATININE 0.6 06/03/2016 1144   CALCIUM 9.1 06/03/2016 1144   PROT 7.0 06/03/2016 1144   ALBUMIN 3.9 06/03/2016 1144   AST 17 06/03/2016 1144   ALT 18 06/03/2016 1144   ALKPHOS 62 06/03/2016 1144   BILITOT 0.44 06/03/2016 1144    INo results found for: SPEP, UPEP  Lab Results  Component Value Date   WBC 5.3 06/03/2016   NEUTROABS 3.6 06/03/2016   HGB 12.9 06/03/2016    HCT 38.3 06/03/2016   MCV 88.1 06/03/2016   PLT 200 06/03/2016      Chemistry      Component Value Date/Time   NA 141 06/03/2016 1144   K 4.4 06/03/2016 1144   CO2 28 06/03/2016 1144   BUN 10.7 06/03/2016 1144   CREATININE 0.6 06/03/2016 1144      Component Value Date/Time   CALCIUM 9.1 06/03/2016 1144   ALKPHOS 62 06/03/2016 1144   AST 17 06/03/2016 1144   ALT 18 06/03/2016 1144   BILITOT 0.44 06/03/2016 1144       No results found for: LABCA2  No components found for: LABCA125  No results for input(s): INR in the last 168 hours.  Urinalysis No results found for: COLORURINE, APPEARANCEUR, LABSPEC, PHURINE, GLUCOSEU, HGBUR, BILIRUBINUR, KETONESUR, PROTEINUR, UROBILINOGEN, NITRITE, LEUKOCYTESUR   STUDIES: No results found.  ELIGIBLE FOR AVAILABLE RESEARCH PROTOCOL: no  ASSESSMENT: 61 y.o. BRCA negative [place] woman status post right breast upper outer quadrant biopsy 01/05/2016 for a clinical T1b N0, stage IA invasive ductal carcinoma, grade 1, estrogen and progesterone receptor positive, HER-2 negative, with an MIB-1 of 10%  (1) status post right lumpectomy with sentinel lymph node sampling 02/10/2016 for a pT1c pN0, stage IA invasive ductal carcinoma, grade 1, with negative margins.  (2) Oncotype score of 14 predicts a 10 year risk of recurrence outside the breast of 9% if the patient's only systemic therapy is tamoxifen for 5 years. It also predicts no benefit from adjuvant chemotherapy.  (3) adjuvant radiation 04/20/2016 to 05/17/2016 Site/dose:    1. The Right breast was treated to 40.5 Gy in 15 fractions at 2.67 Gy per fraction. 2. The Right breast was boosted to 10 Gy in 5 fractions at 2 Gy per fraction.  (4) tamoxifen started 02/04/2016  (5) genetics testing on March 02, 2016 through the [facility] cancer panel offered by [employer] found no deleterious mutations in APC, ATM, BARD1, BMPR1A, BRCA1, BRCA2, BRIP1, CHD1, CDK4,  CDKN2A, CHEK2, EPCAM (large rearrangement only), MLH1, MSH2, MSH6, MUTYH, NBN, PALB2, PMS2, PTEN, RAD51C, RAD51D, SMAD4, STK11, and TP53. Sequencing was performed for select regions of POLE and POLD1, and large rearrangement analysis was performed for select regions of GREM1.  PLAN: Gabrella did well with her local treatments which she finally completed mid February, and she is tolerating tamoxifen well. Today we  reviewed the possible toxicities, side effects and complications of this agent and she is well informed regarding that. She also understands the benefits.  There are of course other options. She can do tamoxifen for 10 years, tamoxifen for a few years and then switch to an aromatase inhibitor, or simply switch to an aromatase inhibitor for 5 years. At this point she is considering tamoxifen for 10 years which is certainly as good as any of the other options just listed.  She was using vaginal estrogens until recently. She does not feel she needs them at present. She understands while she is on tamoxifen I have no problems with her using estrogen cream or the Estring ring.  She tells me she will see Dr. Brigitte Pulse in April, Dr. [name] in May, and Dr. Radene Knee in October. Accordingly I will see her again in August and we may start yearly follow-up at that point.  She knows to call for any other issues that may develop before her next visit.   Chauncey Cruel, MD   06/03/2016 4:43 PM Medical Oncology and Hematology [facility] [address] [place], [place] [address] Tel. [phone]    Fax. [phone]

## 2016-06-16 ENCOUNTER — Other Ambulatory Visit: Payer: Self-pay

## 2016-06-16 ENCOUNTER — Encounter: Payer: Self-pay | Admitting: Radiation Oncology

## 2016-06-18 ENCOUNTER — Ambulatory Visit: Payer: PRIVATE HEALTH INSURANCE | Admitting: Radiation Oncology

## 2016-06-22 NOTE — Progress Notes (Signed)
Photon Boost Complex Emergency planning/management officer Note Outpatient  Diagnosis:   ICD-9-CM ICD-10-CM  Malignant neoplasm of upper-outer quadrant of right breast in female, estrogen receptor positive ([place]) 174.4 C50.411  V86.0 Z17.0    The patient's CT images from her simulation were reviewed to plan her boost treatment to her right breast  lumpectomy cavity.  The boost to the lumpectomy cavity will be delivered with 3 photon fields using MLCs for custom blocks again heart and lungs.  This constitutes 3 complex treatment devices. Isodose plan was reviewed and approved. 10 Gy in 5 fractions prescribed.  -----------------------------------  Eppie Gibson, MD

## 2016-06-23 ENCOUNTER — Ambulatory Visit
Admission: RE | Admit: 2016-06-23 | Discharge: 2016-06-23 | Disposition: A | Discharge status: Home / Self Care | Payer: PRIVATE HEALTH INSURANCE | Source: Ambulatory Visit | Attending: Radiation Oncology | Admitting: Radiation Oncology

## 2016-06-23 ENCOUNTER — Encounter: Payer: Self-pay | Admitting: Radiation Oncology

## 2016-06-23 DIAGNOSIS — Z923 Personal history of irradiation: Secondary | ICD-10-CM | POA: Diagnosis not present

## 2016-06-23 DIAGNOSIS — Z170 Estrogen receptor positive status [ER+]: Secondary | ICD-10-CM | POA: Insufficient documentation

## 2016-06-23 DIAGNOSIS — Z79899 Other long term (current) drug therapy: Secondary | ICD-10-CM | POA: Insufficient documentation

## 2016-06-23 DIAGNOSIS — Z79810 Long term (current) use of selective estrogen receptor modulators (SERMs): Secondary | ICD-10-CM | POA: Diagnosis not present

## 2016-06-23 DIAGNOSIS — C50411 Malignant neoplasm of upper-outer quadrant of right female breast: Secondary | ICD-10-CM | POA: Diagnosis present

## 2016-06-23 HISTORY — DX: Personal history of irradiation: Z92.3

## 2016-06-23 NOTE — Progress Notes (Signed)
Radiation Oncology         [phone] ________________________________  Name: Julie Mack MRN: [id number]  Date: 06/23/2016  DOB: 1955/04/28  Follow-Up Visit Note  Outpatient  CC: Marton Redwood, MD  Marton Redwood, MD  Diagnosis and Prior Radiotherapy:    ICD-9-CM ICD-10-CM   1. Malignant neoplasm of upper-outer quadrant of right breast in female, estrogen receptor positive ([place]) 174.4 C50.411    V86.0 Z17.0      Stage I  pathologic T1cN0 clinical M0 Right Breast UOQ Invasive Ductal Carcinoma, ER+ / PR+ / Her2-, Grade I  04/20/16-05/17/16: 40.05 Gy to the right breast in 15 fractions + 10 Gy boost to the right breast in 5 fractions  CHIEF COMPLAINT: Here for follow-up and surveillance of right breast cancer  Narrative:  The patient returns today for routine follow-up of radiation to the right breast. She denies pain or fatigue at this time. She started Tamoxifen on 02/04/16 without difficulties. Per nursing, her right breast is normal appearing. The patient denies use of Radiaplex or Vitamin E cream at this time. Nursing encouraged her to use Vitamin E cream for more healing..                             ALLERGIES:  is allergic to gluten meal.  Meds: Current Outpatient Prescriptions  Medication Sig Dispense Refill  . betamethasone valerate ointment (VALISONE) 0.1 %     . fluorouracil (EFUDEX) 5 % cream Apply topically as needed.    . tamoxifen (NOLVADEX) 20 MG tablet Take 20 mg by mouth daily.    . valACYclovir (VALTREX) 1000 MG tablet Take 1,000 mg by mouth 2 (two) times daily. 01/08/15 as needed for pain    . vitamin C (ASCORBIC ACID) 500 MG tablet Take 500 mg by mouth daily.    . folic acid (FOLVITE) 308 MCG tablet Take 400 mcg by mouth daily.    Marland Kitchen glucosamine-chondroitin 500-400 MG tablet Take 1 tablet by mouth every morning.    . Multiple Vitamins-Minerals (MULTI COMPLETE PO) Take by mouth.    Marland Kitchen UNABLE TO FIND every morning. Med Name: calcium 600 mg    . UNABLE  TO FIND every morning. Med Name: vitamin D    . UNABLE TO FIND every morning. Med Name: flax oil     No current facility-administered medications for this encounter.     Physical Findings: The patient is in no acute distress. Patient is alert and oriented.  height is '5\' 4"'  (1.626 m) and weight is 173 lb 3.2 oz (78.6 kg). Her temperature is 97.6 F (36.4 C). Her blood pressure is 121/71 and her pulse is 77. Her oxygen saturation is 100%. .    Residual erythema noted in the central right breast. Overall, the skin has healed very well.  Lab Findings: Lab Results  Component Value Date   WBC 5.3 06/03/2016   HGB 12.9 06/03/2016   HCT 38.3 06/03/2016   MCV 88.1 06/03/2016   PLT 200 06/03/2016    Radiographic Findings: No results found.  Impression/Plan:  61 year old woman with stage I pathologic T1cN0 clinical M0 Right Breast UOQ Invasive Ductal Carcinoma, ER+ / PR+ / Her2-, Grade I  Advised patient to apply Vitamin E lotion to the treatment area. She will follow up in radiation oncology prn. Patient will follow up with Dr. Jana Hakim in August. She is also scheduled to see survivorship in June.    Judson Roch  [name], MD  This document serves as a record of services personally performed by Eppie Gibson, MD. It was created on her behalf by Bethann Humble, a trained medical scribe. The creation of this record is based on the scribe's personal observations and the provider's statements to them. This document has been checked and approved by the attending provider.

## 2016-06-23 NOTE — Progress Notes (Signed)
Ms. Hendry is here for follow up of radiation to her Right Breast. She denies pain or fatigue. She started Tamoxifen 02/04/16 and is doing well with the medicine. She will see Dr. Jana Hakim again in August. She is scheduled to see survivorship in June. Her Right Breast is normal appearing. She is not using Radiaplex or Vitamin E cream at this time. I have encouraged her to begin using vitamin E cream for skin protection.  BP 121/71   Pulse 77   Temp 97.6 F (36.4 C)   Ht 5\' 4"  (1.626 m)   Wt 173 lb 3.2 oz (78.6 kg)   LMP 05/04/2011   SpO2 100% Comment: room air  BMI 29.73 kg/m    Wt Readings from Last 3 Encounters:  06/23/16 173 lb 3.2 oz (78.6 kg)  06/03/16 173 lb 4.8 oz (78.6 kg)  05/17/16 170 lb (77.1 kg)

## 2016-08-05 ENCOUNTER — Other Ambulatory Visit: Payer: Self-pay

## 2016-08-05 ENCOUNTER — Other Ambulatory Visit: Payer: Self-pay | Admitting: Internal Medicine

## 2016-08-05 DIAGNOSIS — E041 Nontoxic single thyroid nodule: Secondary | ICD-10-CM

## 2016-08-05 MED ORDER — TAMOXIFEN CITRATE 20 MG PO TABS: 20.0000 mg | ORAL_TABLET | Freq: Every day | ORAL | 1 refills | Status: DC

## 2016-08-06 ENCOUNTER — Ambulatory Visit
Admission: RE | Admit: 2016-08-06 | Discharge: 2016-08-06 | Disposition: A | Discharge status: Home / Self Care | Payer: PRIVATE HEALTH INSURANCE | Source: Ambulatory Visit | Attending: Internal Medicine | Admitting: Internal Medicine

## 2016-08-06 DIAGNOSIS — E041 Nontoxic single thyroid nodule: Secondary | ICD-10-CM

## 2016-08-11 ENCOUNTER — Other Ambulatory Visit: Payer: PRIVATE HEALTH INSURANCE

## 2016-09-16 NOTE — Progress Notes (Signed)
 CLINIC:  Survivorship   REASON FOR VISIT:  Routine follow-up post-treatment for a recent history of breast cancer.  BRIEF ONCOLOGIC HISTORY:    Malignant neoplasm of upper-outer quadrant of right breast in female, estrogen receptor positive (HCC)   01/05/2016 Initial Biopsy    Right breast upper outer quadrant biopsy: IDC, grade 1, ER+(100%), PR+(30%), Ki-67 10%, HER-2 negative (ratio 0.72).       01/26/2016 Initial Diagnosis    Breast cancer of upper-outer quadrant of right female breast (HCC)     02/10/2016 Surgery    Right lumpectomy (Ingram): IDC, DCIS, 1.7cm, grade 1, margins negative, 3 SLN negative      02/10/2016 Oncotype testing    14/9%, low risk      03/02/2016 Genetic Testing    Patient has genetic testing done for a personal and family history of breast cancer and family history of ovarian cancer. Results revealed patient has the following mutation(s): No deleterious mutations found on the [facility] My[facility] panel.  The MyRisk gene panel offered by [facility] includes sequencing and deletion/duplication testing of the following 27 genes: APC, ATM, BARD1, BMPR1A, BRCA1, BRCA2, BRIP1, CHD1, CDK4, CDKN2A, CHEK2, EPCAM (large rearrangement only), MLH1, MSH2, MSH6, MUTYH, NBN, PALB2, PMS2, PTEN, RAD51C, RAD51D, SMAD4, STK11, and TP53. Sequencing was performed for select regions of POLE and POLD1, and large rearrangement analysis was performed for select regions of GREM1. The report date is March 02, 2016.      02/2016 -  Anti-estrogen oral therapy    Tamoxifen daily      04/20/2016 - 05/17/2016 Radiation Therapy    Adjuvant radiation ([facility]): 1. The Right breast was treated to 40.05 Gy in 15 fractions at 2.67 Gy per fraction.  2. The Right breast was boosted to 10 Gy in 5 fractions at 2 Gy per fraction.       INTERVAL HISTORY:  Ms. Trawick presents to the Survivorship Clinic today for our initial meeting to review her survivorship care plan  detailing her treatment course for breast cancer, as well as monitoring long-term side effects of that treatment, education regarding health maintenance, screening, and overall wellness and health promotion.     Overall, Ms. Dennin reports feeling quite well today.  She is taking Tamoxifen daily.  She does have worsened "menopause" symptoms right now.  This she is managing.  She is experiencing joint aches and pains, hot flashes, weight gain, and this was present prior to starting the Tamoxifen, but does feel more magnified once she started taking it.      REVIEW OF SYSTEMS:  Review of Systems  Constitutional: Negative for appetite change, chills, diaphoresis, fatigue, fever and unexpected weight change.  HENT:   Negative for hearing loss and lump/mass.   Eyes: Negative for eye problems and icterus.  Respiratory: Negative for chest tightness, cough and shortness of breath.   Cardiovascular: Negative for chest pain, leg swelling and palpitations.  Gastrointestinal: Negative for abdominal distention and abdominal pain.  Endocrine: Negative for hot flashes.  Genitourinary: Negative for difficulty urinating.   Musculoskeletal: Negative for arthralgias.  Skin: Negative for itching and rash.  Neurological: Negative for dizziness, extremity weakness and headaches.  Hematological: Negative for adenopathy.  Psychiatric/Behavioral: Negative for depression. The patient is not nervous/anxious.   Breast: Denies any new nodularity, masses, tenderness, nipple changes, or nipple discharge.      ONCOLOGY TREATMENT TEAM:  1. Surgeon:  Dr. Ingram at [facility] 2. Medical Oncologist: Dr. Magrinat  3. Radiation Oncologist:   Dr. S[facility]    PAST MEDICAL/SURGICAL HISTORY:  Past Medical History:  Diagnosis Date  . Asthma    allergy induced asthma as child, none now  . Breast cancer (HCC) 01/2016   right breast cancer  . Family history of breast cancer   . Family history of ovarian  cancer   . Headache   . History of radiation therapy 04/20/16- 05/17/16   Right Breast 40.05 Gy in 15 fractions and Right Breast boost 10 Gy in 5 fractions.   . Melanoma (HCC)    in situ  . Osteoporosis    Past Surgical History:  Procedure Laterality Date  . ANKLE FRACTURE SURGERY Right 2015  . BREAST LUMPECTOMY WITH RADIOACTIVE SEED AND SENTINEL LYMPH NODE BIOPSY Right 02/10/2016   Procedure: RIGHT BREAST LUMPECTOMY WITH RADIOACTIVE SEED AND RIGHT SENTINEL LYMPH NODE BIOPSY WITH INJECT BLUE DYE;  Surgeon: Haywood Ingram, MD;  Location: [facility];  Service: General;  Laterality: Right;  . DILATION AND CURETTAGE OF UTERUS    . ECTOPIC PREGNANCY SURGERY  1996  . EYE SURGERY  1967   cyst removed  . TONSILLECTOMY  1966     ALLERGIES:  Allergies  Allergen Reactions  . Gluten Meal Nausea Only     CURRENT MEDICATIONS:  Outpatient Encounter Prescriptions as of 09/17/2016  Medication Sig Note  . betamethasone valerate ointment (VALISONE) 0.1 %  01/26/2016: Received from: External Pharmacy  . fluorouracil (EFUDEX) 5 % cream Apply topically as needed. 02/10/2016: Last used one year ago   . Multiple Vitamins-Minerals (MULTI COMPLETE PO) Take by mouth.   . tamoxifen (NOLVADEX) 20 MG tablet Take 1 tablet (20 mg total) by mouth daily.   . valACYclovir (VALTREX) 1000 MG tablet Take 1,000 mg by mouth 2 (two) times daily. 01/08/15 as needed for pain   . vitamin C (ASCORBIC ACID) 500 MG tablet Take 500 mg by mouth daily.   . folic acid (FOLVITE) 400 MCG tablet Take 400 mcg by mouth daily.   . glucosamine-chondroitin 500-400 MG tablet Take 1 tablet by mouth every morning.   . UNABLE TO FIND every morning. Med Name: calcium 600 mg   . UNABLE TO FIND every morning. Med Name: vitamin D   . UNABLE TO FIND every morning. Med Name: flax oil    No facility-administered encounter medications on file as of 09/17/2016.      ONCOLOGIC FAMILY HISTORY:  Family History  Problem Relation Age  of Onset  . Heart attack Mother   . Cancer - Lung Father   . Breast cancer Sister 46  . Ovarian cancer Maternal Grandmother 40  . Stroke Maternal Grandfather   . Stroke Paternal Grandfather   . Breast cancer Other        MGMs sister dx under 50  . Breast cancer Other        MGMs sister dx over 50  . Breast cancer Other        PGMs sister     GENETIC COUNSELING/TESTING: See above  SOCIAL HISTORY:  Shaneeka K Lindo is married and lives with her husband in [place], [place].  She has 2 children and they live in [place] and [place].  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  She denies any current or history of tobacco, alcohol, or illicit drug use.     PHYSICAL EXAMINATION:  Vital Signs:   Vitals:   09/17/16 0940  BP: 126/65  Pulse: 60    Resp: 18  Temp: 97.7 F (36.5 C)   Filed Weights   09/17/16 0940  Weight: 179 lb (81.2 kg)   General: Well-nourished, well-appearing female in no acute distress.  She is unaccompanied today.   HEENT: Head is normocephalic.  Pupils equal and reactive to light. Conjunctivae clear without exudate.  Sclerae anicteric. Oral mucosa is pink, moist.  Oropharynx is pink without lesions or erythema.  Lymph: No cervical, supraclavicular, or infraclavicular lymphadenopathy noted on palpation.  Cardiovascular: Regular rate and rhythm.. Respiratory: Clear to auscultation bilaterally. Chest expansion symmetric; breathing non-labored.  GI: Abdomen soft and round; non-tender, non-distended. Bowel sounds normoactive.  GU: Deferred.  Neuro: No focal deficits. Steady gait.  Psych: Mood and affect normal and appropriate for situation.  Extremities: No edema. MSK: No focal spinal tenderness to palpation.  Full range of motion in bilateral upper extremities Skin: Warm and dry.  LABORATORY DATA:  None for this visit.  DIAGNOSTIC IMAGING:  None for this visit.      ASSESSMENT AND PLAN:    Ms.. Kable is a pleasant 60 y.o. female with Stage IA right breast invasive ductal carcinoma, ER+/PR+/HER2-, diagnosed in 01/2016, treated with lumpectomy, adjuvant radiation therapy, and anti-estrogen therapy with Tamoxifen beginning in 02/2016.  She presents to the Survivorship Clinic for our initial meeting and routine follow-up post-completion of treatment for breast cancer.    1. Stage IA right breast cancer:  Ms. Guidroz is continuing to recover from definitive treatment for breast cancer. She will follow-up with her medical oncologist, Dr. Magrinat in August, 2018 with history and physical exam per surveillance protocol.  She will continue her anti-estrogen therapy with Tamoxifen. Thus far, she is tolerating the Tamoxifen well, with minimal side effects. She was instructed to make Dr. Magrinat or myself aware if she begins to experience any worsening side effects of the medication and I could see her back in clinic to help manage those side effects, as needed. Today, a comprehensive survivorship care plan and treatment summary was reviewed with the patient today detailing her breast cancer diagnosis, treatment course, potential late/long-term effects of treatment, appropriate follow-up care with recommendations for the future, and patient education resources.  A copy of this summary, along with a letter will be sent to the patient's primary care provider via mail/fax/In Basket message after today's visit.    2. Bone health:  Given Ms. Jeff's age/history of breast cancer she is at risk for bone demineralization.  She was given education on specific activities to promote bone health.  Tamoxifen can help strengthen her bones, which is a positive side effect of the medication.  We will defer to her PCP for bone density testing and management.   3. Cancer screening:  Due to Ms. Ravelo's history and her age, she should receive screening for skin cancers, colon cancer, and gynecologic  cancers.  The information and recommendations are listed on the patient's comprehensive care plan/treatment summary and were reviewed in detail with the patient.    4. Health maintenance and wellness promotion: Ms. Deignan was encouraged to consume 5-7 servings of fruits and vegetables per day. We reviewed the "Nutrition Rainbow" handout, as well as the handout "Take Control of Your Health and Reduce Your Cancer Risk" from the American Cancer Society.  She was also encouraged to engage in moderate to vigorous exercise for 30 minutes per day most days of the week. We discussed the [employer] fitness program, which is designed for cancer survivors to help them become more physically   fit after cancer treatments.  She was instructed to limit her alcohol consumption and continue to abstain from tobacco use.     5. Support services/counseling: It is not uncommon for this period of the patient's cancer care trajectory to be one of many emotions and stressors.  We discussed an opportunity for her to participate in the next session of FYNN ("Finding Your New Normal") support group series designed for patients after they have completed treatment.   Ms. Morgenstern was encouraged to take advantage of our many other support services programs, support groups, and/or counseling in coping with her new life as a cancer survivor after completing anti-cancer treatment.  She was offered support today through active listening and expressive supportive counseling.  She was given information regarding our available services and encouraged to contact me with any questions or for help enrolling in any of our support group/programs.    Dispo:   Suggested follow up -Return to cancer center 11/2016 for follow up with Dr. Magrinat at which point she will go to annual f/u with him -Mammogram due in 12/2016, orders placed today -Breast MRI 06/2017, orders placed today -Follow up with Dr. Ingram in February, 2019 -She is welcome  to return back to the Survivorship Clinic at any time; no additional follow-up needed at this time.  -Consider referral back to survivorship as a long-term survivor for continued surveillance  A total of (30) minutes of face-to-face time was spent with this patient with greater than 50% of that time in counseling and care-coordination.   Lindsey Cornetto Causey, NP Survivorship Program [facility] [phone]   Note: PRIMARY CARE PROVIDER Shaw, William, MD [phone]   

## 2016-09-17 ENCOUNTER — Ambulatory Visit (HOSPITAL_BASED_OUTPATIENT_CLINIC_OR_DEPARTMENT_OTHER): Payer: PRIVATE HEALTH INSURANCE | Admitting: Adult Health

## 2016-09-17 ENCOUNTER — Encounter: Payer: Self-pay | Admitting: Adult Health

## 2016-09-17 VITALS — BP 126/65 | HR 60 | Temp 97.7°F | Resp 18 | Ht 64.0 in | Wt 179.0 lb

## 2016-09-17 DIAGNOSIS — C50411 Malignant neoplasm of upper-outer quadrant of right female breast: Secondary | ICD-10-CM | POA: Diagnosis not present

## 2016-09-17 DIAGNOSIS — Z170 Estrogen receptor positive status [ER+]: Secondary | ICD-10-CM

## 2016-11-17 ENCOUNTER — Ambulatory Visit (HOSPITAL_BASED_OUTPATIENT_CLINIC_OR_DEPARTMENT_OTHER): Payer: PRIVATE HEALTH INSURANCE | Admitting: Oncology

## 2016-11-17 ENCOUNTER — Other Ambulatory Visit (HOSPITAL_BASED_OUTPATIENT_CLINIC_OR_DEPARTMENT_OTHER): Payer: PRIVATE HEALTH INSURANCE

## 2016-11-17 VITALS — BP 135/73 | HR 70 | Temp 97.7°F | Resp 18 | Ht 64.0 in | Wt 178.6 lb

## 2016-11-17 DIAGNOSIS — Z170 Estrogen receptor positive status [ER+]: Principal | ICD-10-CM

## 2016-11-17 DIAGNOSIS — C50411 Malignant neoplasm of upper-outer quadrant of right female breast: Secondary | ICD-10-CM

## 2016-11-17 DIAGNOSIS — N951 Menopausal and female climacteric states: Secondary | ICD-10-CM | POA: Diagnosis not present

## 2016-11-17 DIAGNOSIS — R21 Rash and other nonspecific skin eruption: Secondary | ICD-10-CM | POA: Diagnosis not present

## 2016-11-17 LAB — CBC WITH DIFFERENTIAL/PLATELET
BASO%: 0.4 % (ref 0.0–2.0)
BASOS ABS: 0 10*3/uL (ref 0.0–0.1)
EOS ABS: 0.1 10*3/uL (ref 0.0–0.5)
EOS%: 2.3 % (ref 0.0–7.0)
HEMATOCRIT: 38.7 % (ref 34.8–46.6)
HEMOGLOBIN: 12.9 g/dL (ref 11.6–15.9)
LYMPH#: 1.5 10*3/uL (ref 0.9–3.3)
LYMPH%: 26.8 % (ref 14.0–49.7)
MCH: 29.8 pg (ref 25.1–34.0)
MCHC: 33.3 g/dL (ref 31.5–36.0)
MCV: 89.4 fL (ref 79.5–101.0)
MONO#: 0.5 10*3/uL (ref 0.1–0.9)
MONO%: 9.3 % (ref 0.0–14.0)
NEUT#: 3.4 10*3/uL (ref 1.5–6.5)
NEUT%: 61.2 % (ref 38.4–76.8)
PLATELETS: 196 10*3/uL (ref 145–400)
RBC: 4.33 10*6/uL (ref 3.70–5.45)
RDW: 12.3 % (ref 11.2–14.5)
WBC: 5.6 10*3/uL (ref 3.9–10.3)

## 2016-11-17 LAB — COMPREHENSIVE METABOLIC PANEL
ALBUMIN: 3.8 g/dL (ref 3.5–5.0)
ALK PHOS: 50 U/L (ref 40–150)
ALT: 20 U/L (ref 0–55)
AST: 17 U/L (ref 5–34)
Anion Gap: 8 mEq/L (ref 3–11)
BILIRUBIN TOTAL: 0.35 mg/dL (ref 0.20–1.20)
BUN: 16.2 mg/dL (ref 7.0–26.0)
CALCIUM: 9.8 mg/dL (ref 8.4–10.4)
CO2: 29 mEq/L (ref 22–29)
Chloride: 104 mEq/L (ref 98–109)
Creatinine: 0.7 mg/dL (ref 0.6–1.1)
Glucose: 119 mg/dl (ref 70–140)
Potassium: 4.6 mEq/L (ref 3.5–5.1)
Sodium: 140 mEq/L (ref 136–145)
Total Protein: 7 g/dL (ref 6.4–8.3)

## 2016-11-17 MED ORDER — KETOCONAZOLE 2 % EX CREA: 1.0000 "application " | TOPICAL_CREAM | Freq: Every day | CUTANEOUS | 0 refills | Status: DC

## 2016-11-17 MED ORDER — TAMOXIFEN CITRATE 20 MG PO TABS: 20.0000 mg | ORAL_TABLET | Freq: Every day | ORAL | 1 refills | Status: DC

## 2016-11-17 NOTE — Progress Notes (Signed)
[facility]  [phone] [phone]     ID: Julie Mack DOB: 1955-08-06  MR#: [id number]  NOB#:[id number]  Patient Care Team: Marton Redwood, MD as PCP - General (Internal Medicine) Fanny Skates, MD as Consulting Physician (General Surgery) Eppie Gibson, MD as Attending Physician (Radiation Oncology) Rolm Bookbinder, MD as Consulting Physician (Dermatology) Ronald Lobo, MD as Consulting Physician (Gastroenterology) Arvella Nigh, MD as Consulting Physician (Obstetrics and Gynecology) Magrinat, Virgie Dad, MD as Consulting Physician (Oncology) Delice Bison, Charlestine Massed, NP as Nurse Practitioner (Hematology and Oncology) Chauncey Cruel, MD OTHER MD: Tamoxifen  CHIEF COMPLAINT: Estrogen receptor positive breast cancer  CURRENT TREATMENT:    BREAST CANCER HISTORY: From the original intake note:  Julie Mack undergoes high risk breast cancer screening because of a significant family history and on 12/28/2015 bilateral breast MRI found a 6 mm enhancing mass in the upper outer quadrant of the right breast there were no other abnormalities including no left breast findings and no adenopathy. Biopsy of the right breast mass in question documented invasive ductal carcinoma, grade 1, estrogen receptor 100% positive, with strong staining intensity, progesterone receptor 30% positive, with moderate staining intensity, with an MIB-1 of 10%, and no HER-2 amplification, the signals ratio being 0.72 and the number per cell 1.15. A second suspicious area also in the upper-outer quadrant was biopsied at the same time and showed only usual ductal hyperplasia.  Her subsequent history is as detailed below.   INTERVAL HISTORY: Julie Mack returns today for follow-up of her estrogen receptor positive breast cancer. She continues on tamoxifen, with good tolerance. She does have some hot flashes and some vaginal wetness issues but these are not major. She obtains a drug at a  good price.  REVIEW OF SYSTEMS: Julie Mack is back to working full-time. They recently traveled to [place] where her daughter was in turning in a museum and they will take her to [place] and travel around [place] in the next month. She is studying our history there. Julie Mack does complain of some joints hurting, but this is not worse than before. She exercises regularly. A detailed review of systems today was otherwise stable  PAST MEDICAL HISTORY: Past Medical History:  Diagnosis Date  . Asthma    allergy induced asthma as child, none now  . Breast cancer ([place]) 01/2016   right breast cancer  . Family history of breast cancer   . Family history of ovarian cancer   . Headache   . History of radiation therapy 04/20/16- 05/17/16   Right Breast 40.05 Gy in 15 fractions and Right Breast boost 10 Gy in 5 fractions.   . Melanoma ([place])    in situ  . Osteoporosis     PAST SURGICAL HISTORY: Past Surgical History:  Procedure Laterality Date  . ANKLE FRACTURE SURGERY Right 2015  . BREAST LUMPECTOMY WITH RADIOACTIVE SEED AND SENTINEL LYMPH NODE BIOPSY Right 02/10/2016   Procedure: RIGHT BREAST LUMPECTOMY WITH RADIOACTIVE SEED AND RIGHT SENTINEL LYMPH NODE BIOPSY WITH INJECT BLUE DYE;  Surgeon: Fanny Skates, MD;  Location: [facility];  Service: General;  Laterality: Right;  . DILATION AND CURETTAGE OF UTERUS    . [place]  . EYE SURGERY  1967   cyst removed  . TONSILLECTOMY  1966    FAMILY HISTORY Family History  Problem Relation Age of Onset  . Heart attack Mother   . Cancer - Lung Father   . Breast cancer Sister 2  . Ovarian cancer  Maternal Grandmother 40  . Stroke Maternal Grandfather   . Stroke Paternal Grandfather   . Breast cancer Other        MGMs sister dx under 28  . Breast cancer Other        MGMs sister dx over 39  . Breast cancer Other        PGMs sister  As of October 2017 Johnathan's parents are still living, in their early 94s. Her father  had lung cancer diagnosed at age 33 (essentially a nonsmoker); a maternal grandmother had ovarian cancer at age 33 the patient has 2 brothers, 2 sisters, with one sister diagnosed with breast cancer at age 32("and doing fine").  GYNECOLOGIC HISTORY:  Patient's last menstrual period was 05/04/2011. Energy age 58, first live birth age 69, menopause age 17, on vaginal estrogen cream until October 2017. 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 [name] is Agricultural consultant in [place]. Their daughter [name] is Going to be studying our history at [employer] beginning later 2018    ADVANCED DIRECTIVES: In place   HEALTH MAINTENANCE: Social History  Substance Use Topics  . Smoking status: Never Smoker  . Smokeless tobacco: Never Used  . Alcohol use 0.0 oz/week     Comment: 2 glasses wine a day     Colonoscopy:  PAP:  Bone density:   Allergies  Allergen Reactions  . Gluten Meal Nausea Only    Current Outpatient Prescriptions  Medication Sig Dispense Refill  . betamethasone valerate ointment (VALISONE) 0.1 %     . fluorouracil (EFUDEX) 5 % cream Apply topically as needed.    Marland Kitchen ketoconazole (NIZORAL) 2 % cream Apply 1 application topically daily. 15 g 0  . Multiple Vitamins-Minerals (MULTI COMPLETE PO) Take by mouth.    . tamoxifen (NOLVADEX) 20 MG tablet Take 1 tablet (20 mg total) by mouth daily. 90 tablet 1  . UNABLE TO FIND every morning. Med Name: calcium 600 mg    . UNABLE TO FIND every morning. Med Name: vitamin D    . valACYclovir (VALTREX) 1000 MG tablet Take 1,000 mg by mouth 2 (two) times daily. 01/08/15 as needed for pain    . vitamin C (ASCORBIC ACID) 500 MG tablet Take 500 mg by mouth daily.     No current facility-administered medications for this visit.     OBJECTIVE: Middle-aged white woman In no acute distress  Vitals:   11/17/16 1425  BP: 135/73    Pulse: 70  Resp: 18  Temp: 97.7 F (36.5 C)  SpO2: 100%     Body mass index is 30.66 kg/m.    ECOG FS:0 - Asymptomatic  Sclerae unicteric, EOMs intact Oropharynx clear and moist No cervical or supraclavicular adenopathy Lungs no rales or rhonchi Heart regular rate and rhythm Abd soft, nontender, positive bowel sounds MSK no focal spinal tenderness, no upper extremity lymphedema Neuro: nonfocal, well oriented, appropriate affect Breasts: The right breast has undergone lumpectomy followed by radiation with no evidence of local recurrence. The left breast is benign. Both axillae are benign.   LAB RESULTS:  CMP     Component Value Date/Time   NA 140 11/17/2016 1402   K 4.6 11/17/2016 1402   CO2 29 11/17/2016 1402   GLUCOSE 119 11/17/2016 1402   BUN 16.2 11/17/2016 1402   CREATININE 0.7 11/17/2016 1402   CALCIUM 9.8 11/17/2016 1402   PROT  7.0 11/17/2016 1402   ALBUMIN 3.8 11/17/2016 1402   AST 17 11/17/2016 1402   ALT 20 11/17/2016 1402   ALKPHOS 50 11/17/2016 1402   BILITOT 0.35 11/17/2016 1402    INo results found for: SPEP, UPEP  Lab Results  Component Value Date   WBC 5.6 11/17/2016   NEUTROABS 3.4 11/17/2016   HGB 12.9 11/17/2016   HCT 38.7 11/17/2016   MCV 89.4 11/17/2016   PLT 196 11/17/2016      Chemistry      Component Value Date/Time   NA 140 11/17/2016 1402   K 4.6 11/17/2016 1402   CO2 29 11/17/2016 1402   BUN 16.2 11/17/2016 1402   CREATININE 0.7 11/17/2016 1402      Component Value Date/Time   CALCIUM 9.8 11/17/2016 1402   ALKPHOS 50 11/17/2016 1402   AST 17 11/17/2016 1402   ALT 20 11/17/2016 1402   BILITOT 0.35 11/17/2016 1402       No results found for: LABCA2  No components found for: LABCA125  No results for input(s): INR in the last 168 hours.  Urinalysis No results found for: COLORURINE, APPEARANCEUR, LABSPEC, PHURINE, GLUCOSEU, HGBUR, BILIRUBINUR, KETONESUR, PROTEINUR, UROBILINOGEN, NITRITE,  LEUKOCYTESUR   STUDIES: She is due for repeat diagnostic mammography mid November ELIGIBLE FOR AVAILABLE RESEARCH PROTOCOL: no  ASSESSMENT: 61 y.o. BRCA negative [place] woman status post right breast upper outer quadrant biopsy 01/05/2016 for a clinical T1b N0, stage IA invasive ductal carcinoma, grade 1, estrogen and progesterone receptor positive, HER-2 negative, with an MIB-1 of 10%  (1) status post right lumpectomy with sentinel lymph node sampling 02/10/2016 for a pT1c pN0, stage IA invasive ductal carcinoma, grade 1, with negative margins.  (2) Oncotype score of 14 predicts a 10 year risk of recurrence outside the breast of 9% if the patient's only systemic therapy is tamoxifen for 5 years. It also predicts no benefit from adjuvant chemotherapy.  (3) adjuvant radiation 04/20/2016 to 05/17/2016 Site/dose:    1. The Right breast was treated to 40.5 Gy in 15 fractions at 2.67 Gy per fraction. 2. The Right breast was boosted to 10 Gy in 5 fractions at 2 Gy per fraction.  (4) tamoxifen started 02/04/2016  (5) genetics testing on March 02, 2016 through the [facility] cancer panel offered by [employer] found no deleterious mutations in APC, ATM, BARD1, BMPR1A, BRCA1, BRCA2, BRIP1, CHD1, CDK4, CDKN2A, CHEK2, EPCAM (large rearrangement only), MLH1, MSH2, MSH6, MUTYH, NBN, PALB2, PMS2, PTEN, RAD51C, RAD51D, SMAD4, STK11, and TP53. Sequencing was performed for select regions of POLE and POLD1, and large rearrangement analysis was performed for select regions of GREM1.  PLAN: Tammye Will soon be a year out from definitive surgery for her breast cancer with no evidence of disease recurrence. This is favorable.  She is tolerating tamoxifen well. She is having hot flashes and today we discussed venlafaxine, gabapentin, and TTS-1 as options. At this point she does not feel the problem is bad enough for her to take an extra medication.  The plan remains for tamoxifen for 5  years.  For the rash under her right breast I suggested Nizoral 2% cream and I wrote her that prescription.  She is going to see me again in March, after a breast MRI that month. I will start seeing her on a once a year basis from that point.   Chauncey Cruel, MD   11/17/2016 3:23 PM Medical Oncology and Hematology [facility]  [place], [place] [address] Tel. [phone]    Fax. [phone]

## 2016-11-24 ENCOUNTER — Other Ambulatory Visit: Payer: Self-pay | Admitting: *Deleted

## 2016-11-24 MED ORDER — TAMOXIFEN CITRATE 20 MG PO TABS: 20.0000 mg | ORAL_TABLET | Freq: Every day | ORAL | 1 refills | Status: DC

## 2016-12-16 ENCOUNTER — Ambulatory Visit
Admission: RE | Admit: 2016-12-16 | Discharge: 2016-12-16 | Disposition: A | Discharge status: Home / Self Care | Payer: PRIVATE HEALTH INSURANCE | Source: Ambulatory Visit | Attending: Adult Health | Admitting: Adult Health

## 2016-12-16 DIAGNOSIS — C50411 Malignant neoplasm of upper-outer quadrant of right female breast: Secondary | ICD-10-CM

## 2016-12-16 DIAGNOSIS — Z170 Estrogen receptor positive status [ER+]: Principal | ICD-10-CM

## 2016-12-16 HISTORY — DX: Personal history of irradiation: Z92.3

## 2017-02-19 ENCOUNTER — Other Ambulatory Visit: Payer: Self-pay

## 2017-02-19 ENCOUNTER — Emergency Department (HOSPITAL_COMMUNITY): Payer: PRIVATE HEALTH INSURANCE

## 2017-02-19 ENCOUNTER — Encounter (HOSPITAL_COMMUNITY): Payer: Self-pay | Admitting: Emergency Medicine

## 2017-02-19 ENCOUNTER — Emergency Department (HOSPITAL_COMMUNITY)
Admission: EM | Admit: 2017-02-19 | Discharge: 2017-02-20 | Disposition: A | Discharge status: Home / Self Care | Payer: PRIVATE HEALTH INSURANCE | Attending: Physician Assistant | Admitting: Physician Assistant

## 2017-02-19 DIAGNOSIS — W01198A Fall on same level from slipping, tripping and stumbling with subsequent striking against other object, initial encounter: Secondary | ICD-10-CM | POA: Diagnosis not present

## 2017-02-19 DIAGNOSIS — Y92000 Kitchen of unspecified non-institutional (private) residence as  the place of occurrence of the external cause: Secondary | ICD-10-CM | POA: Insufficient documentation

## 2017-02-19 DIAGNOSIS — S4992XA Unspecified injury of left shoulder and upper arm, initial encounter: Secondary | ICD-10-CM | POA: Diagnosis present

## 2017-02-19 DIAGNOSIS — Y998 Other external cause status: Secondary | ICD-10-CM | POA: Insufficient documentation

## 2017-02-19 DIAGNOSIS — Y9389 Activity, other specified: Secondary | ICD-10-CM | POA: Insufficient documentation

## 2017-02-19 DIAGNOSIS — Z85828 Personal history of other malignant neoplasm of skin: Secondary | ICD-10-CM | POA: Diagnosis not present

## 2017-02-19 DIAGNOSIS — J45909 Unspecified asthma, uncomplicated: Secondary | ICD-10-CM | POA: Diagnosis not present

## 2017-02-19 DIAGNOSIS — Z853 Personal history of malignant neoplasm of breast: Secondary | ICD-10-CM | POA: Diagnosis not present

## 2017-02-19 DIAGNOSIS — S42302A Unspecified fracture of shaft of humerus, left arm, initial encounter for closed fracture: Secondary | ICD-10-CM | POA: Insufficient documentation

## 2017-02-19 DIAGNOSIS — Z79899 Other long term (current) drug therapy: Secondary | ICD-10-CM | POA: Insufficient documentation

## 2017-02-19 DIAGNOSIS — S42292A Other displaced fracture of upper end of left humerus, initial encounter for closed fracture: Secondary | ICD-10-CM

## 2017-02-19 MED ORDER — IBUPROFEN 800 MG PO TABS
800.0000 mg | ORAL_TABLET | Freq: Once | ORAL | Status: AC
  Administered 2017-02-19: 800 mg via ORAL
  Filled 2017-02-19: qty 1

## 2017-02-19 NOTE — ED Provider Notes (Signed)
[facility] EMERGENCY DEPARTMENT Provider Note   CSN: [id number] Arrival date & time: 02/19/17  2240     History   Chief Complaint Chief Complaint  Patient presents with  . Shoulder Pain    HPI Julie Mack is a 61 y.o. female.  HPI   Julie Mack is a 61yo female with a history of breast cancer (s/p lumpectomy 2017), asthma, headaches who presents emergency department for evaluation of left shoulder pain.  She states that about an hour ago she tripped over a rug in her kitchen and fell straight forward hitting her left shoulder and face against the ground. She denies preceding dizziness or light headedness. She denies loss of consciousness.  She does not take blood thinners.  States that she has severe left shoulder pain which is constant and described as "sharp."  Pain is worsened with any movement of the shoulder joint including flexion, abduction and extension. She has not taken any over-the-counter pain medications for her symptoms.  Patient denies numbness, weakness, headache, visual disturbance, nausea/vomiting.   Past Medical History:  Diagnosis Date  . Asthma    allergy induced asthma as child, none now  . Breast cancer ([place]) 01/2016   right breast cancer  . Family history of breast cancer   . Family history of ovarian cancer   . Headache   . History of radiation therapy 04/20/16- 05/17/16   Right Breast 40.05 Gy in 15 fractions and Right Breast boost 10 Gy in 5 fractions.   . Melanoma ([place])    in situ  . Osteoporosis   . Personal history of radiation therapy 2017    Patient Active Problem List   Diagnosis Date Noted  . Genetic testing 03/05/2016  . Family history of breast cancer   . Family history of ovarian cancer   . Malignant neoplasm of upper-outer quadrant of right breast in female, estrogen receptor positive ([place]) 01/26/2016    Past Surgical History:  Procedure Laterality Date  . ANKLE FRACTURE SURGERY Right 2015  . BREAST  LUMPECTOMY Right 02/10/2016  . DILATION AND CURETTAGE OF UTERUS    . [place]  . EYE SURGERY  1967   cyst removed  . RIGHT BREAST LUMPECTOMY WITH RADIOACTIVE SEED AND RIGHT SENTINEL LYMPH NODE BIOPSY WITH INJECT BLUE DYE Right 02/10/2016   Performed by Fanny Skates, MD at [facility]  . TONSILLECTOMY  1966    OB History    No data available       Home Medications    Prior to Admission medications   Medication Sig Start Date End Date Taking? Authorizing Provider  betamethasone valerate ointment (VALISONE) 0.1 %  12/10/15   [provider]  fluorouracil (EFUDEX) 5 % cream Apply topically as needed.    [provider]  ketoconazole (NIZORAL) 2 % cream Apply 1 application topically daily. 11/17/16   Magrinat, Virgie Dad, MD  Multiple Vitamins-Minerals (MULTI COMPLETE PO) Take by mouth.    [provider]  tamoxifen (NOLVADEX) 20 MG tablet Take 1 tablet (20 mg total) by mouth daily. 11/24/16   Magrinat, Virgie Dad, MD  UNABLE TO FIND every morning. Med Name: calcium 600 mg    [provider]  UNABLE TO FIND every morning. Med Name: vitamin D    [provider]  valACYclovir (VALTREX) 1000 MG tablet Take 1,000 mg by mouth 2 (two) times daily. 01/08/15 as needed for pain    [provider]  vitamin C (ASCORBIC ACID) 500 MG tablet Take 500 mg by mouth daily.    [provider]    Family History Family History  Problem Relation Age of Onset  . Heart attack Mother   . Cancer - Lung Father   . Breast cancer Sister 20  . Ovarian cancer Maternal Grandmother 36  . Stroke Maternal Grandfather   . Stroke Paternal Grandfather   . Breast cancer Other        MGMs sister dx under 34  . Breast cancer Other        MGMs sister dx over 28  . Breast cancer Other        PGMs sister    Social History Social History   Tobacco Use  . Smoking status: Never Smoker  . Smokeless tobacco: Never Used    Substance Use Topics  . Alcohol use: Yes    Alcohol/week: 0.0 oz    Comment: 2 glasses wine a day  . Drug use: No     Allergies   Gluten meal   Review of Systems Review of Systems  Constitutional: Negative for fever.  Eyes: Negative for visual disturbance.  Gastrointestinal: Negative for nausea and vomiting.  Musculoskeletal: Positive for arthralgias (left shoulder). Negative for joint swelling.  Skin: Negative for wound.  Neurological: Negative for dizziness, speech difficulty, weakness, numbness and headaches.     Physical Exam Updated Vital Signs BP 116/75 (BP Location: Right Arm)   Pulse 79   Temp (!) 97 F (36.1 C) (Oral)   Resp 16   Ht 5\' 4"  (1.626 m)   Wt 79.4 kg (175 lb)   LMP 05/04/2011   SpO2 100%   BMI 30.04 kg/m   Physical Exam  Constitutional: She is oriented to person, place, and time. She appears well-developed and well-nourished. No distress.  HENT:  Head: Normocephalic and atraumatic.  Mouth/Throat: Oropharynx is clear and moist. No oropharyngeal exudate.  Small abrasion noted on the left lateral eyebrow. No break in skin. No overlying tenderness.   Eyes: Conjunctivae and EOM are normal. Pupils are equal, round, and reactive to light. Right eye exhibits no discharge. Left eye exhibits no discharge.  Neck: Normal range of motion. Neck supple.  Pulmonary/Chest: Effort normal. No respiratory distress.  Musculoskeletal:  Left shoulder with tenderness to palpation grossly over the humeral head. Very limited active ROM due to pain. Significant pain with forearm supination. No swelling, erythema or ecchymosis present. No step-off, crepitus, or deformity appreciated. 5/5 grip strength. 2+ radial pulse, sensation intact and all compartments soft.  Neurological: She is alert and oriented to person, place, and time. Coordination normal.  Mental Status:  Alert, oriented, thought content appropriate, able to give a coherent history. Speech fluent without  evidence of aphasia. Able to follow 2 step commands without difficulty.  Cranial Nerves:  II:  Peripheral visual fields grossly normal, pupils equal, round, reactive to light III,IV, VI: ptosis not present, extra-ocular motions intact bilaterally  V,VII: smile symmetric, facial light touch sensation equal VIII: hearing grossly normal to voice  X: uvula elevates symmetrically  XI: bilateral shoulder shrug symmetric and strong XII: midline tongue extension without fassiculations Motor:  Normal tone. 5/5 in upper and lower extremities bilaterally including strong and equal grip strength and dorsiflexion/plantar flexion Sensory: Pinprick and light touch normal in all extremities.  Deep Tendon Reflexes: 2+ and symmetric in the biceps and patella Cerebellar: normal finger-to-nose with bilateral upper extremities Gait: normal gait and balance  Skin: Skin is warm  and dry. Capillary refill takes less than 2 seconds. She is not diaphoretic.  Psychiatric: She has a normal mood and affect. Her behavior is normal.  Nursing note and vitals reviewed.   ED Treatments / Results  Labs (all labs ordered are listed, but only abnormal results are displayed) Labs Reviewed - No data to display  EKG  EKG Interpretation None       Radiology No results found.  Procedures Procedures (including critical care time)  Medications Ordered in ED Medications - No data to display   Initial Impression / Assessment and Plan / ED Course  I have reviewed the triage vital signs and the nursing notes.  Pertinent labs & imaging results that were available during my care of the patient were reviewed by me and considered in my medical decision making (see chart for details).     Left arm is neurovascularly intact. Xray of left shoulder reveals non-displaced humeral head fracture. Patient placed in a sling immobilizer and given information to follow-up with orthopedics. She states that she does not want opioid  pain medication. Have counseled her on NSAID use and RICE protocol.    Patient hit her head during the fall.  Denies headache. No blood thinner use. No focal neurological deficits on exam.  Do not suspect closed head injury.  Discussed return precautions and patient agrees and voiced understanding to the above plan.  Final Clinical Impressions(s) / ED Diagnoses   Final diagnoses:  Closed fracture of head of left humerus, initial encounter    ED Discharge Orders    None       Glyn Ade, PA-C 02/20/17 0141    Macarthur Critchley, MD 02/20/17 2204

## 2017-02-19 NOTE — ED Triage Notes (Signed)
Pt had a mechanical fall face forward resulting in left shoulder pain.  Pt reports on dizziness prior to the fall and no LOC after the fall.  Initially she was dizzy after the fall but reports is feeling normal now.  Not on blood thinners.

## 2017-02-20 NOTE — Discharge Instructions (Signed)
X-ray of your right arm showed a nondisplaced fracture of the left humeral head.  I have listed the information to the orthopedic doctor below (Dr. Marlou Sa.)  Please call and schedule an appointment for evaluation and management of your shoulder fracture.  If you would rather contact your previous orthopedic doctor, that is okay too.

## 2017-04-09 DIAGNOSIS — M25512 Pain in left shoulder: Secondary | ICD-10-CM | POA: Insufficient documentation

## 2017-04-09 DIAGNOSIS — S42202A Unspecified fracture of upper end of left humerus, initial encounter for closed fracture: Secondary | ICD-10-CM | POA: Insufficient documentation

## 2017-06-14 NOTE — Progress Notes (Signed)
[facility]  Telephone:([phone]) [phone] Fax:([phone]) [phone]     ID: Julie Mack DOB: [birth year]  MR#: [id number]  HYI#:[id number]  Patient Care Team: Julie Redwood, MD as PCP - General (Internal Medicine) Julie Skates, MD as Consulting Physician (General Surgery) Julie Gibson, MD as Attending Physician (Radiation Oncology) Julie Bookbinder, MD as Consulting Physician (Dermatology) Julie Lobo, MD as Consulting Physician (Gastroenterology) Julie Nigh, MD as Consulting Physician (Obstetrics and Gynecology) Bettymae Yott, Virgie Dad, MD as Consulting Physician (Oncology) Julie Mack, Julie Massed, NP as Nurse Practitioner (Hematology and Oncology) OTHER MD: Julie Mack  CHIEF COMPLAINT: Estrogen receptor positive breast cancer  CURRENT TREATMENT: Julie Mack   BREAST CANCER HISTORY: From the original intake note:  Julie Mack undergoes high risk breast cancer screening because of a significant family history and on 12/28/2015 bilateral breast MRI found a 6 mm enhancing mass in the upper outer quadrant of the right breast there were no other abnormalities including no left breast findings and no adenopathy. Biopsy of the right breast mass in question documented invasive ductal carcinoma, grade 1, estrogen receptor 100% positive, with strong staining intensity, progesterone receptor 30% positive, with moderate staining intensity, with an MIB-1 of 10%, and no HER-2 amplification, the signals ratio being 0.72 and the number per cell 1.15. A second suspicious area also in the upper-outer quadrant was biopsied at the same time and showed only usual ductal hyperplasia.  Her subsequent history is as detailed below.   INTERVAL HISTORY: Julie Mack returns today for follow-up of her estrogen receptor positive breast cancer. She continues on Julie Mack, with good tolerance. She has frequent hot flashes that are tolerable. She would prefer not to take something for them. She still has increased  vaginal discharge, but this is not a major issue for her.   Since her last visit, she underwent diagnostic bilateral mammography with CAD and tomography on 12/16/2016 at [facility] showing: breast density category C. There was no evidence of malignancy in either breast.   She also had a shoulder x-ray on 02/19/2017 due to a fall, which found: Acute nondisplaced fracture involving the lateral left humeral head with extension through the greater tuberosity. She saw orthopedics, Julie Mack.  She is unsure how she fell. She reports that it was the 5th time she fell that year. She wore a sling, with no complications.   REVIEW OF SYSTEMS: Julie Mack reports that she is doing well. During her most recent fall in November 2018, she was walking in her house and she didn't feel that she mistepped, but she fell. During the spring of 2018, she was outside and stepped on a soft spot on the grass and fell. Earlier that year, she also went on a business trip and fell when she stepped on a crack in the ground. She had right ankle surgery 3 years ago, due to falling while on a trip. She thinks that her ankle problems have resolved and are not necessarily caused by weak ankles. For exercise, she walks 4-6 miles. She denies unusual headaches, visual changes, nausea, vomiting, or dizziness. There has been no unusual cough, phlegm production, or pleurisy. This been no change in bowel or bladder habits. She denies unexplained fatigue or unexplained weight loss, bleeding, rash, or fever. A detailed review of systems was otherwise stable.    PAST MEDICAL HISTORY: Past Medical History:  Diagnosis Date  . Asthma    allergy induced asthma as child, none now  . Breast cancer ([place]) 01/2016   right breast cancer  . Family  history of breast cancer   . Family history of ovarian cancer   . Headache   . History of radiation therapy 04/20/16- 05/17/16   Right Breast 40.05 Gy in 15 fractions and Right Breast boost 10 Gy in 5  fractions.   . Melanoma ([place])    in situ  . Osteoporosis   . Personal history of radiation therapy 2017    PAST SURGICAL HISTORY: Past Surgical History:  Procedure Laterality Date  . ANKLE FRACTURE SURGERY Right 2015  . BREAST LUMPECTOMY Right 02/10/2016  . BREAST LUMPECTOMY WITH RADIOACTIVE SEED AND SENTINEL LYMPH NODE BIOPSY Right 02/10/2016   Procedure: RIGHT BREAST LUMPECTOMY WITH RADIOACTIVE SEED AND RIGHT SENTINEL LYMPH NODE BIOPSY WITH INJECT BLUE DYE;  Surgeon: Julie Skates, MD;  Location: [facility];  Service: General;  Laterality: Right;  . DILATION AND CURETTAGE OF UTERUS    . [place]  . EYE SURGERY  1967   cyst removed  . TONSILLECTOMY  1966    FAMILY HISTORY Family History  Problem Relation Age of Onset  . Heart attack Mother   . Cancer - Lung Father   . Breast cancer Sister 23  . Ovarian cancer Maternal Grandmother 24  . Stroke Maternal Grandfather   . Stroke Paternal Grandfather   . Breast cancer Other        MGMs sister dx under 43  . Breast cancer Other        MGMs sister dx over 20  . Breast cancer Other        PGMs sister  As of October 2017 Julie Mack's parents are still living, in their early 18s. Her father had lung cancer diagnosed at age 60 (essentially a nonsmoker); a maternal grandmother had ovarian cancer at age 6 the patient has 2 brothers, 2 sisters, with one sister diagnosed with breast cancer at age 55("and doing fine").  GYNECOLOGIC HISTORY:  Patient's last menstrual period was 05/04/2011. Energy age 80, first live birth age 63, menopause age 42, on vaginal estrogen cream until October 2017. 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 Their son Julie Mack is Agricultural consultant in [place]. Their daughter Julie Mack is Going to be studying our history at [employer] beginning later 2018    ADVANCED DIRECTIVES: In  place   HEALTH MAINTENANCE: Social History   Tobacco Use  . Smoking status: Never Smoker  . Smokeless tobacco: Never Used  Substance Use Topics  . Alcohol use: Yes    Alcohol/week: 0.0 oz    Comment: 2 glasses wine a day  . Drug use: No     Colonoscopy:  PAP:  Bone density:   Allergies  Allergen Reactions  . Gluten Meal Nausea Only    Current Outpatient Medications  Medication Sig Dispense Refill  . betamethasone valerate ointment (VALISONE) 0.1 %     . fluorouracil (EFUDEX) 5 % cream Apply topically as needed.    Marland Kitchen ketoconazole (NIZORAL) 2 % cream Apply 1 application topically daily. 15 g 0  . Multiple Vitamins-Minerals (MULTI COMPLETE PO) Take by mouth.    . Julie Mack (NOLVADEX) 20 MG tablet Take 1 tablet (20 mg total) by mouth daily. 90 tablet 1  . UNABLE TO FIND every morning. Med Name: calcium 600 mg    . UNABLE TO FIND every morning. Med Name: vitamin D    . valACYclovir (VALTREX) 1000 MG tablet Take 1,000 mg  by mouth 2 (two) times daily. 01/08/15 as needed for pain    . vitamin C (ASCORBIC ACID) 500 MG tablet Take 500 mg by mouth daily.     No current facility-administered medications for this visit.     OBJECTIVE: Middle-aged white woman who appears well  Vitals:   06/16/17 1346  BP: 112/67  Pulse: 66  Resp: 18  Temp: 98.5 F (36.9 C)  SpO2: 99%     Body mass index is 29.83 kg/m.    ECOG FS:1 - Symptomatic but completely ambulatory  Sclerae unicteric, pupils round and equal Oropharynx clear and moist No cervical or supraclavicular adenopathy Lungs no rales or rhonchi Heart regular rate and rhythm Abd soft, nontender, positive bowel sounds MSK no focal spinal tenderness, no upper extremity lymphedema Neuro: nonfocal, well oriented, appropriate affect; she has a normal Romberg, easily stands on one foot without significant balance issues, and dorsiflexion is 5/5 bilaterally.  She has normal sensation in both feet Breasts: The right breast is status  post lumpectomy and radiation.  There is no evidence of local recurrence.  The left breast is benign.  Both axillae are benign.  LAB RESULTS:  CMP     Component Value Date/Time   NA 140 11/17/2016 1402   K 4.6 11/17/2016 1402   CO2 29 11/17/2016 1402   GLUCOSE 119 11/17/2016 1402   BUN 16.2 11/17/2016 1402   CREATININE 0.7 11/17/2016 1402   CALCIUM 9.8 11/17/2016 1402   PROT 7.0 11/17/2016 1402   ALBUMIN 3.8 11/17/2016 1402   AST 17 11/17/2016 1402   ALT 20 11/17/2016 1402   ALKPHOS 50 11/17/2016 1402   BILITOT 0.35 11/17/2016 1402    INo results found for: SPEP, UPEP  Lab Results  Component Value Date   WBC 6.1 06/16/2017   NEUTROABS 3.7 06/16/2017   HGB 13.1 06/16/2017   HCT 39.3 06/16/2017   MCV 88.0 06/16/2017   PLT 213 06/16/2017      Chemistry      Component Value Date/Time   NA 140 11/17/2016 1402   K 4.6 11/17/2016 1402   CO2 29 11/17/2016 1402   BUN 16.2 11/17/2016 1402   CREATININE 0.7 11/17/2016 1402      Component Value Date/Time   CALCIUM 9.8 11/17/2016 1402   ALKPHOS 50 11/17/2016 1402   AST 17 11/17/2016 1402   ALT 20 11/17/2016 1402   BILITOT 0.35 11/17/2016 1402       No results found for: LABCA2  No components found for: LABCA125  No results for input(s): INR in the last 168 hours.  Urinalysis No results found for: COLORURINE, APPEARANCEUR, LABSPEC, PHURINE, GLUCOSEU, HGBUR, BILIRUBINUR, KETONESUR, PROTEINUR, UROBILINOGEN, NITRITE, LEUKOCYTESUR   STUDIES: Since her last visit, she underwent diagnostic bilateral mammography with CAD and tomography on 12/16/2016 at [facility] showing: breast density category C. There was no evidence of malignancy in either breast.   She also had a shoulder x-ray on 02/19/2017 due to a fall, which found: Acute nondisplaced fracture involving the lateral left humeral head with extension through the greater tuberosity.   ELIGIBLE FOR AVAILABLE RESEARCH PROTOCOL: no  ASSESSMENT: 62 y.o. BRCA  negative [place] woman status post right breast upper outer quadrant biopsy 01/05/2016 for a clinical T1b N0, stage IA invasive ductal carcinoma, grade 1, estrogen and progesterone receptor positive, HER-2 negative, with an MIB-1 of 10%  (1) status post right lumpectomy with sentinel lymph node sampling 02/10/2016 for a pT1c pN0, stage IA invasive ductal carcinoma,  grade 1, with negative margins.  (2) Oncotype score of 14 predicts a 10 year risk of recurrence outside the breast of 9% if the patient's only systemic therapy is Julie Mack for 5 years. It also predicts no benefit from adjuvant chemotherapy.  (3) adjuvant radiation 04/20/2016 to 05/17/2016 Site/dose:    1. The Right breast was treated to 40.5 Gy in 15 fractions at 2.67 Gy per fraction. 2. The Right breast was boosted to 10 Gy in 5 fractions at 2 Gy per fraction.  (4) Julie Mack started 02/04/2016  (5) genetics testing on March 02, 2016 through the [facility] cancer panel offered by [employer] found no deleterious mutations in APC, ATM, BARD1, BMPR1A, BRCA1, BRCA2, BRIP1, CHD1, CDK4, CDKN2A, CHEK2, EPCAM (large rearrangement only), MLH1, MSH2, MSH6, MUTYH, NBN, PALB2, PMS2, PTEN, RAD51C, RAD51D, SMAD4, STK11, and TP53. Sequencing was performed for select regions of POLE and POLD1, and large rearrangement analysis was performed for select regions of GREM1.  (a) patient is "high risk" on the basis of family history and received breast MRI yearly in addition to yearly mammography  PLAN: Kelle is now a year and a half out from definitive surgery for breast cancer with no evidence of disease recurrence.  This is very febrile.  She is tolerating Julie Mack well and the plan will be to continue that a minimum of 5 years.  She is at high risk and will have an MRI of the breast this month.  She will then have mammography in September and see me again in October.  I do not have a simple explanation for her multiple falls  in the last year or 2.  She does have prior surgery to one ankle and it could be that even though I cannot demonstrate weakness or loss of sensation and she does seem to have a good sense of balance nevertheless that may be the reason.  If she continues to fall she should strongly consider using a cane  I did suggest she try our tai chi class here on Wednesday mornings  She knows to call for any other problems that may develop before next visit. I have reviewed the above documentation for accuracy and completeness, and I agree with the above.

## 2017-06-16 ENCOUNTER — Telehealth: Payer: Self-pay | Admitting: Oncology

## 2017-06-16 ENCOUNTER — Inpatient Hospital Stay: Payer: PRIVATE HEALTH INSURANCE | Attending: Oncology | Admitting: Oncology

## 2017-06-16 ENCOUNTER — Inpatient Hospital Stay: Payer: PRIVATE HEALTH INSURANCE

## 2017-06-16 VITALS — BP 112/67 | HR 66 | Temp 98.5°F | Resp 18 | Ht 64.0 in | Wt 173.8 lb

## 2017-06-16 DIAGNOSIS — C50411 Malignant neoplasm of upper-outer quadrant of right female breast: Secondary | ICD-10-CM

## 2017-06-16 DIAGNOSIS — Z923 Personal history of irradiation: Secondary | ICD-10-CM | POA: Diagnosis not present

## 2017-06-16 DIAGNOSIS — J45909 Unspecified asthma, uncomplicated: Secondary | ICD-10-CM

## 2017-06-16 DIAGNOSIS — Z79810 Long term (current) use of selective estrogen receptor modulators (SERMs): Secondary | ICD-10-CM

## 2017-06-16 DIAGNOSIS — Z9181 History of falling: Secondary | ICD-10-CM | POA: Diagnosis not present

## 2017-06-16 DIAGNOSIS — Z803 Family history of malignant neoplasm of breast: Secondary | ICD-10-CM

## 2017-06-16 DIAGNOSIS — Z170 Estrogen receptor positive status [ER+]: Secondary | ICD-10-CM | POA: Insufficient documentation

## 2017-06-16 DIAGNOSIS — Z801 Family history of malignant neoplasm of trachea, bronchus and lung: Secondary | ICD-10-CM | POA: Insufficient documentation

## 2017-06-16 DIAGNOSIS — M810 Age-related osteoporosis without current pathological fracture: Secondary | ICD-10-CM | POA: Diagnosis not present

## 2017-06-16 LAB — COMPREHENSIVE METABOLIC PANEL
ALK PHOS: 53 U/L (ref 40–150)
ALT: 33 U/L (ref 0–55)
ANION GAP: 7 (ref 3–11)
AST: 19 U/L (ref 5–34)
Albumin: 4 g/dL (ref 3.5–5.0)
BUN: 13 mg/dL (ref 7–26)
CALCIUM: 9.5 mg/dL (ref 8.4–10.4)
CO2: 28 mmol/L (ref 22–29)
Chloride: 104 mmol/L (ref 98–109)
Creatinine, Ser: 0.77 mg/dL (ref 0.60–1.10)
GFR calc Af Amer: >60 mL/min (ref >60)
GFR calc non Af Amer: >60 mL/min (ref >60)
Glucose, Bld: 97 mg/dL (ref 70–140)
POTASSIUM: 5 mmol/L (ref 3.5–5.1)
Sodium: 139 mmol/L (ref 136–145)
Total Bilirubin: 0.3 mg/dL (ref 0.2–1.2)
Total Protein: 7 g/dL (ref 6.4–8.3)

## 2017-06-16 LAB — CBC WITH DIFFERENTIAL/PLATELET
BASOS PCT: 1 %
Basophils Absolute: 0.1 10*3/uL (ref 0.0–0.1)
Eosinophils Absolute: 0.1 10*3/uL (ref 0.0–0.5)
Eosinophils Relative: 2 %
HCT: 39.3 % (ref 34.8–46.6)
HEMOGLOBIN: 13.1 g/dL (ref 11.6–15.9)
Lymphocytes Relative: 29 %
Lymphs Abs: 1.7 10*3/uL (ref 0.9–3.3)
MCH: 29.4 pg (ref 25.1–34.0)
MCHC: 33.5 g/dL (ref 31.5–36.0)
MCV: 88 fL (ref 79.5–101.0)
MONOS PCT: 7 %
Monocytes Absolute: 0.4 10*3/uL (ref 0.1–0.9)
NEUTROS PCT: 61 %
Neutro Abs: 3.7 10*3/uL (ref 1.5–6.5)
Platelets: 213 10*3/uL (ref 145–400)
RBC: 4.46 MIL/uL (ref 3.70–5.45)
RDW: 12.7 % (ref 11.2–14.5)
WBC: 6.1 10*3/uL (ref 3.9–10.3)

## 2017-06-16 NOTE — Telephone Encounter (Signed)
Per 3/14 los decline avs and calendar

## 2017-07-05 ENCOUNTER — Other Ambulatory Visit: Payer: Self-pay | Admitting: Oncology

## 2017-07-05 ENCOUNTER — Other Ambulatory Visit (HOSPITAL_COMMUNITY): Payer: Self-pay | Admitting: *Deleted

## 2017-07-06 ENCOUNTER — Ambulatory Visit (HOSPITAL_COMMUNITY)
Admission: RE | Admit: 2017-07-06 | Discharge: 2017-07-06 | Disposition: A | Discharge status: Home / Self Care | Payer: PRIVATE HEALTH INSURANCE | Source: Ambulatory Visit | Attending: Internal Medicine | Admitting: Internal Medicine

## 2017-07-06 DIAGNOSIS — M810 Age-related osteoporosis without current pathological fracture: Secondary | ICD-10-CM | POA: Insufficient documentation

## 2017-07-06 MED ORDER — ZOLEDRONIC ACID 5 MG/100ML IV SOLN
INTRAVENOUS | Status: AC
  Filled 2017-07-06: qty 100

## 2017-07-06 MED ORDER — ZOLEDRONIC ACID 5 MG/100ML IV SOLN
5.0000 mg | Freq: Once | INTRAVENOUS | Status: AC
  Administered 2017-07-06: 5 mg via INTRAVENOUS

## 2017-07-06 NOTE — Discharge Instructions (Signed)

## 2017-07-11 ENCOUNTER — Other Ambulatory Visit: Payer: PRIVATE HEALTH INSURANCE

## 2017-07-30 ENCOUNTER — Other Ambulatory Visit: Payer: Self-pay | Admitting: Oncology

## 2017-08-05 ENCOUNTER — Telehealth: Payer: Self-pay

## 2017-08-05 ENCOUNTER — Ambulatory Visit
Admission: RE | Admit: 2017-08-05 | Discharge: 2017-08-05 | Disposition: A | Discharge status: Home / Self Care | Payer: PRIVATE HEALTH INSURANCE | Source: Ambulatory Visit | Attending: Oncology | Admitting: Oncology

## 2017-08-05 DIAGNOSIS — Z170 Estrogen receptor positive status [ER+]: Principal | ICD-10-CM

## 2017-08-05 DIAGNOSIS — C50411 Malignant neoplasm of upper-outer quadrant of right female breast: Secondary | ICD-10-CM

## 2017-08-05 MED ORDER — GADOBENATE DIMEGLUMINE 529 MG/ML IV SOLN
16.0000 mL | Freq: Once | INTRAVENOUS | Status: AC | PRN
  Administered 2017-08-05: 16 mL via INTRAVENOUS

## 2017-08-05 NOTE — Telephone Encounter (Signed)
Spoke with patient informing her of MRI results that showed no cancer or abnormal breast changes.  Patient voiced understanding and had no questions/concerns.  Knows to call center if any issues arise.

## 2017-08-05 NOTE — Telephone Encounter (Signed)
-----   Message from Gardenia Phlegm, NP sent at 08/05/2017  1:05 PM EDT ----- MRI shows no abnormal breast changes, no cancer ----- Message ----- From: Interface, Rad Results In Sent: 08/05/2017  12:55 PM To: Chauncey Cruel, MD

## 2017-12-15 ENCOUNTER — Other Ambulatory Visit (HOSPITAL_COMMUNITY): Payer: Self-pay | Admitting: Obstetrics and Gynecology

## 2017-12-15 DIAGNOSIS — Z8041 Family history of malignant neoplasm of ovary: Secondary | ICD-10-CM

## 2017-12-15 DIAGNOSIS — R1013 Epigastric pain: Secondary | ICD-10-CM

## 2017-12-20 ENCOUNTER — Ambulatory Visit (HOSPITAL_COMMUNITY)
Admission: RE | Admit: 2017-12-20 | Discharge: 2017-12-20 | Disposition: A | Discharge status: Home / Self Care | Payer: PRIVATE HEALTH INSURANCE | Source: Ambulatory Visit | Attending: Obstetrics and Gynecology | Admitting: Obstetrics and Gynecology

## 2017-12-20 DIAGNOSIS — Z8041 Family history of malignant neoplasm of ovary: Secondary | ICD-10-CM | POA: Diagnosis present

## 2017-12-20 DIAGNOSIS — R1013 Epigastric pain: Secondary | ICD-10-CM | POA: Insufficient documentation

## 2017-12-21 ENCOUNTER — Ambulatory Visit
Admission: RE | Admit: 2017-12-21 | Discharge: 2017-12-21 | Disposition: A | Discharge status: Home / Self Care | Payer: PRIVATE HEALTH INSURANCE | Source: Ambulatory Visit | Attending: Oncology | Admitting: Oncology

## 2017-12-21 DIAGNOSIS — Z170 Estrogen receptor positive status [ER+]: Principal | ICD-10-CM

## 2017-12-21 DIAGNOSIS — C50411 Malignant neoplasm of upper-outer quadrant of right female breast: Secondary | ICD-10-CM

## 2018-01-17 NOTE — Progress Notes (Signed)
[place]  [phone] [phone] I, Lurline Del MD, have reviewed the above documentation for accuracy and completeness, and I agree with the above.     ID: Rutherford Limerick DOB: [birth year]  MR#: [id number]  CSN#:[id number]  Patient Care Team: Marton Redwood, MD as PCP - General (Internal Medicine) Fanny Skates, MD as Consulting Physician (General Surgery) Eppie Gibson, MD as Attending Physician (Radiation Oncology) Rolm Bookbinder, MD as Consulting Physician (Dermatology) Ronald Lobo, MD as Consulting Physician (Gastroenterology) Arvella Nigh, MD as Consulting Physician (Obstetrics and Gynecology) Abshir Paolini, Virgie Dad, MD as Consulting Physician (Oncology) Delice Bison, Charlestine Massed, NP as Nurse Practitioner (Hematology and Oncology) OTHER MD: Tamoxifen  CHIEF COMPLAINT: Estrogen receptor positive breast cancer  CURRENT TREATMENT: tamoxifen   BREAST CANCER HISTORY: From the original intake note:  Ashliegh undergoes high risk breast cancer screening because of a significant family history and on 12/28/2015 bilateral breast MRI found a 6 mm enhancing mass in the upper outer quadrant of the right breast there were no other abnormalities including no left breast findings and no adenopathy. Biopsy of the right breast mass in question documented invasive ductal carcinoma, grade 1, estrogen receptor 100% positive, with strong staining intensity, progesterone receptor 30% positive, with moderate staining intensity, with an MIB-1 of 10%, and no HER-2 amplification, the signals ratio being 0.72 and the number per cell 1.15. A second suspicious area also in the upper-outer quadrant was biopsied at the same time and showed only usual ductal hyperplasia.  Her subsequent history is as detailed below.   INTERVAL HISTORY: Aryanah returns today for follow-up of her estrogen receptor positive breast cancer. She continues on tamoxifen, with fair tolerance. She  has several hot flashes per day. She sweats during the summer with high humidity. She also has increased vaginal discharge, which can be annoying to her.   Since her last visit, she completed a bilateral breast MRI on 08/05/2017 showing: breast density category C. There was no evidence of malignancy.   She also had bilateral diagnostic mammography with CAD a tomography at [facility] on 12/21/2017 showing: density category B. There was no evidence of malignancy.    REVIEW OF SYSTEMS: Marin goes to the gym 2-3 times per week. She also walks 5-6 miles per day on the weekends. She balance issue has improved and she has not had any falls since her last visit. She followed up with her physical therapist who said this could have been done to having weak ankles. She does ankle strengthening exercises.  She denies unusual headaches, visual changes, nausea, vomiting, or dizziness. There has been no unusual cough, phlegm production, or pleurisy. There has been no change in bowel or bladder habits. She denies unexplained fatigue or unexplained weight loss, bleeding, rash, or fever. A detailed review of systems was otherwise stable.    PAST MEDICAL HISTORY: Past Medical History:  Diagnosis Date  . Asthma    allergy induced asthma as child, none now  . Breast cancer ([place]) 01/2016   right breast cancer  . Family history of breast cancer   . Family history of ovarian cancer   . Headache   . History of radiation therapy 04/20/16- 05/17/16   Right Breast 40.05 Gy in 15 fractions and Right Breast boost 10 Gy in 5 fractions.   . Melanoma ([place])    in situ  . Osteoporosis   . Personal history of radiation therapy 2017    PAST SURGICAL HISTORY: Past Surgical History:  Procedure Laterality  Date  . ANKLE FRACTURE SURGERY Right 2015  . BREAST LUMPECTOMY Right 02/10/2016  . BREAST LUMPECTOMY WITH RADIOACTIVE SEED AND SENTINEL LYMPH NODE BIOPSY Right 02/10/2016   Procedure: RIGHT BREAST LUMPECTOMY WITH  RADIOACTIVE SEED AND RIGHT SENTINEL LYMPH NODE BIOPSY WITH INJECT BLUE DYE;  Surgeon: Fanny Skates, MD;  Location: [facility];  Service: General;  Laterality: Right;  . DILATION AND CURETTAGE OF UTERUS    . [place]  . EYE SURGERY  1967   cyst removed  . TONSILLECTOMY  1966    FAMILY HISTORY Family History  Problem Relation Age of Onset  . Heart attack Mother   . Cancer - Lung Father   . Breast cancer Sister 56  . Ovarian cancer Maternal Grandmother 82  . Stroke Maternal Grandfather   . Stroke Paternal Grandfather   . Breast cancer Other        MGMs sister dx under 29  . Breast cancer Other        MGMs sister dx over 38  . Breast cancer Other        PGMs sister  As of October 2017 Cecil's parents are still living, in their early 15s. Her father had lung cancer diagnosed at age 44 (essentially a nonsmoker); a maternal grandmother had ovarian cancer at age 56 the patient has 2 brothers, 2 sisters, with one sister diagnosed with breast cancer at age 79("and doing fine").  GYNECOLOGIC HISTORY:  Patient's last menstrual period was 05/04/2011. Energy age 62, first live birth age 37, menopause age 70, on vaginal estrogen cream until October 2017. 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 [name] is Agricultural consultant in [place]. Their daughter [name] is Going to be studying art history at [employer] beginning later 2018    ADVANCED DIRECTIVES: In place   HEALTH MAINTENANCE: Social History   Tobacco Use  . Smoking status: Never Smoker  . Smokeless tobacco: Never Used  Substance Use Topics  . Alcohol use: Yes    Alcohol/week: 0.0 standard drinks    Comment: 2 glasses wine a day  . Drug use: No     Colonoscopy:  PAP:  Bone density:   Allergies  Allergen Reactions  . Gluten Meal Nausea Only    Current Outpatient Medications   Medication Sig Dispense Refill  . betamethasone valerate ointment (VALISONE) 0.1 %     . fluorouracil (EFUDEX) 5 % cream Apply topically as needed.    Marland Kitchen ketoconazole (NIZORAL) 2 % cream Apply 1 application topically daily. 15 g 0  . Multiple Vitamins-Minerals (MULTI COMPLETE PO) Take by mouth.    . tamoxifen (NOLVADEX) 20 MG tablet TAKE 1 TABLET BY MOUTH EVERY DAY 90 tablet 1  . UNABLE TO FIND every morning. Med Name: calcium 600 mg    . UNABLE TO FIND every morning. Med Name: vitamin D    . valACYclovir (VALTREX) 1000 MG tablet Take 1,000 mg by mouth 2 (two) times daily. 01/08/15 as needed for pain    . vitamin C (ASCORBIC ACID) 500 MG tablet Take 500 mg by mouth daily.     No current facility-administered medications for this visit.     OBJECTIVE: Middle-aged white woman in no acute distress  Vitals:   01/18/18 1037  BP: 124/66  Pulse: 63  Resp: 18  Temp: 98.2 F (36.8 C)  SpO2: 100%  Body mass index is 32.4 kg/m.    ECOG FS:1 - Symptomatic but completely ambulatory  Sclerae unicteric, EOMs intact Oropharynx clear and moist No cervical or supraclavicular adenopathy Lungs no rales or rhonchi Heart regular rate and rhythm Abd soft, nontender, positive bowel sounds MSK no focal spinal tenderness, no upper extremity lymphedema Neuro: nonfocal, well oriented, appropriate affect Breasts: On the right she is undergone lumpectomy followed by radiation.  There is no evidence of local recurrence.  The left breast is unremarkable.  Both axillae are benign.  LAB RESULTS:  CMP     Component Value Date/Time   NA 139 06/16/2017 1324   NA 140 11/17/2016 1402   K 5.0 06/16/2017 1324   K 4.6 11/17/2016 1402   CL 104 06/16/2017 1324   CO2 28 06/16/2017 1324   CO2 29 11/17/2016 1402   GLUCOSE 97 06/16/2017 1324   GLUCOSE 119 11/17/2016 1402   BUN 13 06/16/2017 1324   BUN 16.2 11/17/2016 1402   CREATININE 0.77 06/16/2017 1324   CREATININE 0.7 11/17/2016 1402   CALCIUM 9.5  06/16/2017 1324   CALCIUM 9.8 11/17/2016 1402   PROT 7.0 06/16/2017 1324   PROT 7.0 11/17/2016 1402   ALBUMIN 4.0 06/16/2017 1324   ALBUMIN 3.8 11/17/2016 1402   AST 19 06/16/2017 1324   AST 17 11/17/2016 1402   ALT 33 06/16/2017 1324   ALT 20 11/17/2016 1402   ALKPHOS 53 06/16/2017 1324   ALKPHOS 50 11/17/2016 1402   BILITOT 0.3 06/16/2017 1324   BILITOT 0.35 11/17/2016 1402   GFRNONAA >60 06/16/2017 1324   GFRAA >60 06/16/2017 1324    INo results found for: SPEP, UPEP  Lab Results  Component Value Date   WBC 6.5 01/18/2018   NEUTROABS 3.8 01/18/2018   HGB 13.2 01/18/2018   HCT 40.1 01/18/2018   MCV 89.5 01/18/2018   PLT 205 01/18/2018      Chemistry      Component Value Date/Time   NA 139 06/16/2017 1324   NA 140 11/17/2016 1402   K 5.0 06/16/2017 1324   K 4.6 11/17/2016 1402   CL 104 06/16/2017 1324   CO2 28 06/16/2017 1324   CO2 29 11/17/2016 1402   BUN 13 06/16/2017 1324   BUN 16.2 11/17/2016 1402   CREATININE 0.77 06/16/2017 1324   CREATININE 0.7 11/17/2016 1402      Component Value Date/Time   CALCIUM 9.5 06/16/2017 1324   CALCIUM 9.8 11/17/2016 1402   ALKPHOS 53 06/16/2017 1324   ALKPHOS 50 11/17/2016 1402   AST 19 06/16/2017 1324   AST 17 11/17/2016 1402   ALT 33 06/16/2017 1324   ALT 20 11/17/2016 1402   BILITOT 0.3 06/16/2017 1324   BILITOT 0.35 11/17/2016 1402       No results found for: LABCA2  No components found for: LABCA125  No results for input(s): INR in the last 168 hours.  Urinalysis No results found for: COLORURINE, APPEARANCEUR, LABSPEC, PHURINE, GLUCOSEU, HGBUR, BILIRUBINUR, KETONESUR, PROTEINUR, UROBILINOGEN, NITRITE, LEUKOCYTESUR   STUDIES: Since her last visit, she completed a bilateral breast MRI on 08/05/2017 showing: breast density category C. There was no evidence of malignancy.   She also had bilateral diagnostic mammography with CAD a tomography at [facility] on 12/21/2017 showing: density category B.  There was no evidence of malignancy.   ELIGIBLE FOR AVAILABLE RESEARCH PROTOCOL: no  ASSESSMENT: 62 y.o. BRCA negative [place] woman status post right breast upper outer quadrant biopsy 01/05/2016 for a  clinical T1b N0, stage IA invasive ductal carcinoma, grade 1, estrogen and progesterone receptor positive, HER-2 negative, with an MIB-1 of 10%  (1) status post right lumpectomy with sentinel lymph node sampling 02/10/2016 for a pT1c pN0, stage IA invasive ductal carcinoma, grade 1, with negative margins.  (2) Oncotype score of 14 predicts a 10 year risk of recurrence outside the breast of 9% if the patient's only systemic therapy is tamoxifen for 5 years. It also predicts no benefit from adjuvant chemotherapy.  (3) adjuvant radiation 04/20/2016 to 05/17/2016 Site/dose:    1. The Right breast was treated to 40.5 Gy in 15 fractions at 2.67 Gy per fraction. 2. The Right breast was boosted to 10 Gy in 5 fractions at 2 Gy per fraction.  (4) tamoxifen started 02/04/2016  (5) genetics testing on March 02, 2016 through the [facility] cancer panel offered by [employer] found no deleterious mutations in APC, ATM, BARD1, BMPR1A, BRCA1, BRCA2, BRIP1, CHD1, CDK4, CDKN2A, CHEK2, EPCAM (large rearrangement only), MLH1, MSH2, MSH6, MUTYH, NBN, PALB2, PMS2, PTEN, RAD51C, RAD51D, SMAD4, STK11, and TP53. Sequencing was performed for select regions of POLE and POLD1, and large rearrangement analysis was performed for select regions of GREM1.  (a) patient is "high risk" on the basis of family history and received breast MRI yearly in addition to yearly mammography  PLAN: Chalet is now just about 2 years out from definitive surgery for breast cancer with no evidence of disease recurrence.  This is very favorable.  She is tolerating tamoxifen generally well.  She is annoyed at the vaginal discharge, which indeed bothers many of my patients.  She also has fairly significant hot  flashes.  Accordingly we discussed switching to an aromatase inhibitor.  We discussed the possible toxicity side effects and complications of anastrozole.  She is particularly concerned because of the bone density issues and accordingly the plan is to continue on tamoxifen, certainly for a minimum of 5 years  Given her high risk of developing another breast cancer she will have a breast MRI in April and again repeat mammography in September of next year.  She will see me in October 2020.

## 2018-01-18 ENCOUNTER — Inpatient Hospital Stay: Payer: PRIVATE HEALTH INSURANCE | Attending: Oncology | Admitting: Oncology

## 2018-01-18 ENCOUNTER — Telehealth: Payer: Self-pay | Admitting: Oncology

## 2018-01-18 ENCOUNTER — Inpatient Hospital Stay: Payer: PRIVATE HEALTH INSURANCE

## 2018-01-18 VITALS — BP 124/66 | HR 63 | Temp 98.2°F | Resp 18 | Ht 63.0 in | Wt 182.9 lb

## 2018-01-18 DIAGNOSIS — Z801 Family history of malignant neoplasm of trachea, bronchus and lung: Secondary | ICD-10-CM | POA: Diagnosis not present

## 2018-01-18 DIAGNOSIS — Z79810 Long term (current) use of selective estrogen receptor modulators (SERMs): Secondary | ICD-10-CM | POA: Diagnosis not present

## 2018-01-18 DIAGNOSIS — Z8041 Family history of malignant neoplasm of ovary: Secondary | ICD-10-CM | POA: Insufficient documentation

## 2018-01-18 DIAGNOSIS — Z170 Estrogen receptor positive status [ER+]: Secondary | ICD-10-CM | POA: Insufficient documentation

## 2018-01-18 DIAGNOSIS — Z923 Personal history of irradiation: Secondary | ICD-10-CM | POA: Diagnosis not present

## 2018-01-18 DIAGNOSIS — N898 Other specified noninflammatory disorders of vagina: Secondary | ICD-10-CM

## 2018-01-18 DIAGNOSIS — J45909 Unspecified asthma, uncomplicated: Secondary | ICD-10-CM

## 2018-01-18 DIAGNOSIS — R232 Flushing: Secondary | ICD-10-CM | POA: Diagnosis not present

## 2018-01-18 DIAGNOSIS — M810 Age-related osteoporosis without current pathological fracture: Secondary | ICD-10-CM | POA: Diagnosis not present

## 2018-01-18 DIAGNOSIS — Z79899 Other long term (current) drug therapy: Secondary | ICD-10-CM | POA: Insufficient documentation

## 2018-01-18 DIAGNOSIS — C50411 Malignant neoplasm of upper-outer quadrant of right female breast: Secondary | ICD-10-CM | POA: Diagnosis not present

## 2018-01-18 DIAGNOSIS — Z803 Family history of malignant neoplasm of breast: Secondary | ICD-10-CM | POA: Diagnosis not present

## 2018-01-18 LAB — COMPREHENSIVE METABOLIC PANEL
ALT: 26 U/L (ref 0–44)
AST: 17 U/L (ref 15–41)
Albumin: 3.9 g/dL (ref 3.5–5.0)
Alkaline Phosphatase: 46 U/L (ref 38–126)
Anion gap: 8 (ref 5–15)
BUN: 13 mg/dL (ref 6–20)
CHLORIDE: 103 mmol/L (ref 98–111)
CO2: 30 mmol/L (ref 22–32)
Calcium: 9.8 mg/dL (ref 8.9–10.3)
Creatinine, Ser: 0.65 mg/dL (ref 0.44–1.00)
GFR calc Af Amer: >60 mL/min (ref >60)
Glucose, Bld: 91 mg/dL (ref 70–99)
Potassium: 4.5 mmol/L (ref 3.5–5.1)
Sodium: 141 mmol/L (ref 135–145)
TOTAL PROTEIN: 7.2 g/dL (ref 6.5–8.1)
Total Bilirubin: 0.3 mg/dL (ref 0.3–1.2)

## 2018-01-18 LAB — CBC WITH DIFFERENTIAL/PLATELET
ABS IMMATURE GRANULOCYTES: 0.02 10*3/uL (ref 0.00–0.07)
BASOS ABS: 0.1 10*3/uL (ref 0.0–0.1)
BASOS PCT: 1 %
Eosinophils Absolute: 0.5 10*3/uL (ref 0.0–0.5)
Eosinophils Relative: 8 %
HCT: 40.1 % (ref 36.0–46.0)
HEMOGLOBIN: 13.2 g/dL (ref 12.0–15.0)
Immature Granulocytes: 0 %
LYMPHS PCT: 24 %
Lymphs Abs: 1.5 10*3/uL (ref 0.7–4.0)
MCH: 29.5 pg (ref 26.0–34.0)
MCHC: 32.9 g/dL (ref 30.0–36.0)
MCV: 89.5 fL (ref 80.0–100.0)
Monocytes Absolute: 0.6 10*3/uL (ref 0.1–1.0)
Monocytes Relative: 9 %
NEUTROS PCT: 58 %
NRBC: 0 % (ref 0.0–0.2)
Neutro Abs: 3.8 10*3/uL (ref 1.7–7.7)
PLATELETS: 205 10*3/uL (ref 150–400)
RBC: 4.48 MIL/uL (ref 3.87–5.11)
RDW: 12.2 % (ref 11.5–15.5)
WBC: 6.5 10*3/uL (ref 4.0–10.5)

## 2018-01-18 NOTE — Telephone Encounter (Signed)
Gave patient avs and calendar.  Radiology will call patient re MRI

## 2018-01-25 ENCOUNTER — Other Ambulatory Visit: Payer: Self-pay | Admitting: Oncology

## 2018-07-12 ENCOUNTER — Other Ambulatory Visit: Payer: Self-pay | Admitting: Oncology

## 2018-11-07 ENCOUNTER — Other Ambulatory Visit: Payer: Self-pay | Admitting: Oncology

## 2018-11-07 DIAGNOSIS — Z853 Personal history of malignant neoplasm of breast: Secondary | ICD-10-CM

## 2018-11-14 DIAGNOSIS — J385 Laryngeal spasm: Secondary | ICD-10-CM | POA: Insufficient documentation

## 2018-12-22 ENCOUNTER — Other Ambulatory Visit: Payer: Self-pay | Admitting: Adult Health

## 2018-12-22 DIAGNOSIS — Z853 Personal history of malignant neoplasm of breast: Secondary | ICD-10-CM

## 2018-12-25 ENCOUNTER — Other Ambulatory Visit: Payer: Self-pay

## 2018-12-25 ENCOUNTER — Ambulatory Visit
Admission: RE | Admit: 2018-12-25 | Discharge: 2018-12-25 | Disposition: A | Discharge status: Home / Self Care | Payer: PRIVATE HEALTH INSURANCE | Source: Ambulatory Visit | Attending: Oncology | Admitting: Oncology

## 2018-12-25 DIAGNOSIS — Z853 Personal history of malignant neoplasm of breast: Secondary | ICD-10-CM

## 2019-01-01 ENCOUNTER — Other Ambulatory Visit: Payer: Self-pay | Admitting: Oncology

## 2019-01-24 ENCOUNTER — Inpatient Hospital Stay: Payer: PRIVATE HEALTH INSURANCE | Attending: Oncology | Admitting: Oncology

## 2019-01-24 ENCOUNTER — Other Ambulatory Visit: Payer: Self-pay

## 2019-01-24 VITALS — BP 118/66 | HR 75 | Temp 97.1°F | Resp 18 | Wt 185.7 lb

## 2019-01-24 DIAGNOSIS — Z79899 Other long term (current) drug therapy: Secondary | ICD-10-CM | POA: Insufficient documentation

## 2019-01-24 DIAGNOSIS — Z801 Family history of malignant neoplasm of trachea, bronchus and lung: Secondary | ICD-10-CM | POA: Insufficient documentation

## 2019-01-24 DIAGNOSIS — M810 Age-related osteoporosis without current pathological fracture: Secondary | ICD-10-CM | POA: Diagnosis not present

## 2019-01-24 DIAGNOSIS — Z803 Family history of malignant neoplasm of breast: Secondary | ICD-10-CM | POA: Diagnosis not present

## 2019-01-24 DIAGNOSIS — Z8249 Family history of ischemic heart disease and other diseases of the circulatory system: Secondary | ICD-10-CM | POA: Diagnosis not present

## 2019-01-24 DIAGNOSIS — Z8041 Family history of malignant neoplasm of ovary: Secondary | ICD-10-CM | POA: Diagnosis not present

## 2019-01-24 DIAGNOSIS — Z923 Personal history of irradiation: Secondary | ICD-10-CM | POA: Diagnosis not present

## 2019-01-24 DIAGNOSIS — J45909 Unspecified asthma, uncomplicated: Secondary | ICD-10-CM | POA: Diagnosis not present

## 2019-01-24 DIAGNOSIS — Z170 Estrogen receptor positive status [ER+]: Secondary | ICD-10-CM | POA: Insufficient documentation

## 2019-01-24 DIAGNOSIS — C50411 Malignant neoplasm of upper-outer quadrant of right female breast: Secondary | ICD-10-CM | POA: Diagnosis present

## 2019-01-24 DIAGNOSIS — Z79810 Long term (current) use of selective estrogen receptor modulators (SERMs): Secondary | ICD-10-CM | POA: Diagnosis not present

## 2019-01-24 NOTE — Progress Notes (Signed)
[place]  [phone] [phone] I, Lurline Del MD, have reviewed the above documentation for accuracy and completeness, and I agree with the above.     ID: Julie Mack DOB: 03-18-1956  MR#: [id number]  CSN#:[id number]  Patient Care Team: Marton Redwood, MD as PCP - General (Internal Medicine) Fanny Skates, MD as Consulting Physician (General Surgery) Eppie Gibson, MD as Attending Physician (Radiation Oncology) Rolm Bookbinder, MD as Consulting Physician (Dermatology) Ronald Lobo, MD as Consulting Physician (Gastroenterology) Arvella Nigh, MD as Consulting Physician (Obstetrics and Gynecology) Kiowa Peifer, Virgie Dad, MD as Consulting Physician (Oncology) Delice Bison, Charlestine Massed, NP as Nurse Practitioner (Hematology and Oncology) OTHER MD: Tamoxifen  CHIEF COMPLAINT: Estrogen receptor positive breast cancer  CURRENT TREATMENT: tamoxifen   BREAST CANCER HISTORY: From the original intake note:  Julie Mack undergoes high risk breast cancer screening because of a significant family history and on 12/28/2015 bilateral breast MRI found a 6 mm enhancing mass in the upper outer quadrant of the right breast there were no other abnormalities including no left breast findings and no adenopathy. Biopsy of the right breast mass in question documented invasive ductal carcinoma, grade 1, estrogen receptor 100% positive, with strong staining intensity, progesterone receptor 30% positive, with moderate staining intensity, with an MIB-1 of 10%, and no HER-2 amplification, the signals ratio being 0.72 and the number per cell 1.15. A second suspicious area also in the upper-outer quadrant was biopsied at the same time and showed only usual ductal hyperplasia.  Her subsequent history is as detailed below.   INTERVAL HISTORY: Julie Mack returns today for follow-up and treatment of her estrogen receptor positive breast cancer. She was last seen here on 01/18/2018.    She continues on tamoxifen.  She tolerates this well, with no significant side effects.  She did tell me that Dr. Renold Don recently did ultrasonography and was concerned about endometrial stripe thickening, but that a subsequent hysteroscopy was reassuring.  I do not have those documents but will request them.  Since her last visit here, she underwent a digital diagnostic bilateral mammogram with tomography on 12/25/2018 showing: Breast Density Category B. There is no mammographic evidence of malignancy.    REVIEW OF SYSTEMS: Julie Mack walks anywhere between 3 and 8 miles daily.  She is having some hip pain, which is not yet limiting her but is certainly concerning her.  There have been no unusual headaches visual changes cough phlegm production pleurisy shortness of breath or change in bowel or bladder habits.  A detailed review of systems today was otherwise stable.   PAST MEDICAL HISTORY: Past Medical History:  Diagnosis Date  . Asthma    allergy induced asthma as child, none now  . Breast cancer ([place]) 01/2016   right breast cancer  . Family history of breast cancer   . Family history of ovarian cancer   . Headache   . History of radiation therapy 04/20/16- 05/17/16   Right Breast 40.05 Gy in 15 fractions and Right Breast boost 10 Gy in 5 fractions.   . Melanoma ([place])    in situ  . Osteoporosis   . Personal history of radiation therapy 2017    PAST SURGICAL HISTORY: Past Surgical History:  Procedure Laterality Date  . ANKLE FRACTURE SURGERY Right 2015  . BREAST LUMPECTOMY Right 02/10/2016  . BREAST LUMPECTOMY WITH RADIOACTIVE SEED AND SENTINEL LYMPH NODE BIOPSY Right 02/10/2016   Procedure: RIGHT BREAST LUMPECTOMY WITH RADIOACTIVE SEED AND RIGHT SENTINEL LYMPH NODE BIOPSY WITH INJECT BLUE DYE;  Surgeon: Fanny Skates, MD;  Location: [facility];  Service: General;  Laterality: Right;  . DILATION AND CURETTAGE OF UTERUS    . [place]  . EYE SURGERY   1967   cyst removed  . TONSILLECTOMY  1966    FAMILY HISTORY Family History  Problem Relation Age of Onset  . Heart attack Mother   . Cancer - Lung Father   . Breast cancer Sister 32  . Ovarian cancer Maternal Grandmother 80  . Stroke Maternal Grandfather   . Stroke Paternal Grandfather   . Breast cancer Other        MGMs sister dx under 36  . Breast cancer Other        MGMs sister dx over 20  . Breast cancer Other        PGMs sister  As of October 2017 Julie Mack's parents are still living, in their early 89s. Her father had lung cancer diagnosed at age 11 (essentially a nonsmoker); a maternal grandmother had ovarian cancer at age 7 the patient has 2 brothers, 2 sisters, with one sister diagnosed with breast cancer at age 34("and doing fine").  GYNECOLOGIC HISTORY:  Patient's last menstrual period was 05/04/2011. Energy age 43, first live birth age 70, menopause age 65, on vaginal estrogen cream until October 2017. 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 Their son Julie Mack is Agricultural consultant in [place]. Their daughter Julie Mack is going to be studying art history at [employer] beginning later 2018    ADVANCED DIRECTIVES: In place   HEALTH MAINTENANCE: Social History   Tobacco Use  . Smoking status: Never Smoker  . Smokeless tobacco: Never Used  Substance Use Topics  . Alcohol use: Yes    Alcohol/week: 0.0 standard drinks    Comment: 2 glasses wine a day  . Drug use: No     Colonoscopy:  PAP:  Bone density:   Allergies  Allergen Reactions  . Gluten Meal Nausea Only  . Reclast [Zoledronic Acid] Other (See Comments)    Current Outpatient Medications  Medication Sig Dispense Refill  . betamethasone valerate ointment (VALISONE) 0.1 %     . fluorouracil (EFUDEX) 5 % cream Apply topically as needed.    Marland Kitchen ketoconazole (NIZORAL) 2 % cream Apply 1 application topically  daily. 15 g 0  . Multiple Vitamins-Minerals (MULTI COMPLETE PO) Take by mouth.    . tamoxifen (NOLVADEX) 20 MG tablet TAKE 1 TABLET BY MOUTH EVERY DAY 90 tablet 1  . UNABLE TO FIND every morning. Med Name: calcium 600 mg    . UNABLE TO FIND every morning. Med Name: vitamin D    . valACYclovir (VALTREX) 1000 MG tablet Take 1,000 mg by mouth 2 (two) times daily. 01/08/15 as needed for pain    . vitamin C (ASCORBIC ACID) 500 MG tablet Take 500 mg by mouth daily.     No current facility-administered medications for this visit.     OBJECTIVE: Middle-aged white woman who appears well  Vitals:   01/24/19 1140  BP: 118/66  Pulse: 75  Resp: 18  Temp: (!) 97.1 F (36.2 C)  SpO2: 98%   Wt Readings from Last 3 Encounters:  01/24/19 185 lb 11.2 oz (84.2 kg)  01/18/18 182 lb 14.4 oz (83 kg)  07/06/17 169 lb (76.7 kg)   Body mass index is 32.9 kg/m.    ECOG FS:1 -  Symptomatic but completely ambulatory  Ocular: Sclerae unicteric, pupils round and equal Ear-nose-throat: Wearing a mask Lymphatic: No cervical or supraclavicular adenopathy Lungs no rales or rhonchi Heart regular rate and rhythm Abd soft, nontender, positive bowel sounds MSK no focal spinal tenderness, no joint edema Neuro: non-focal, well-oriented, appropriate affect Breasts: The right side is status post lumpectomy and radiation, with no evidence of local recurrence.  The left side is benign.  Both axillae are benign.   LAB RESULTS:  CMP     Component Value Date/Time   NA 141 01/18/2018 1031   NA 140 11/17/2016 1402   K 4.5 01/18/2018 1031   K 4.6 11/17/2016 1402   CL 103 01/18/2018 1031   CO2 30 01/18/2018 1031   CO2 29 11/17/2016 1402   GLUCOSE 91 01/18/2018 1031   GLUCOSE 119 11/17/2016 1402   BUN 13 01/18/2018 1031   BUN 16.2 11/17/2016 1402   CREATININE 0.65 01/18/2018 1031   CREATININE 0.7 11/17/2016 1402   CALCIUM 9.8 01/18/2018 1031   CALCIUM 9.8 11/17/2016 1402   PROT 7.2 01/18/2018 1031   PROT  7.0 11/17/2016 1402   ALBUMIN 3.9 01/18/2018 1031   ALBUMIN 3.8 11/17/2016 1402   AST 17 01/18/2018 1031   AST 17 11/17/2016 1402   ALT 26 01/18/2018 1031   ALT 20 11/17/2016 1402   ALKPHOS 46 01/18/2018 1031   ALKPHOS 50 11/17/2016 1402   BILITOT 0.3 01/18/2018 1031   BILITOT 0.35 11/17/2016 1402   GFRNONAA >60 01/18/2018 1031   GFRAA >60 01/18/2018 1031    INo results found for: SPEP, UPEP  Lab Results  Component Value Date   WBC 6.5 01/18/2018   NEUTROABS 3.8 01/18/2018   HGB 13.2 01/18/2018   HCT 40.1 01/18/2018   MCV 89.5 01/18/2018   PLT 205 01/18/2018      Chemistry      Component Value Date/Time   NA 141 01/18/2018 1031   NA 140 11/17/2016 1402   K 4.5 01/18/2018 1031   K 4.6 11/17/2016 1402   CL 103 01/18/2018 1031   CO2 30 01/18/2018 1031   CO2 29 11/17/2016 1402   BUN 13 01/18/2018 1031   BUN 16.2 11/17/2016 1402   CREATININE 0.65 01/18/2018 1031   CREATININE 0.7 11/17/2016 1402      Component Value Date/Time   CALCIUM 9.8 01/18/2018 1031   CALCIUM 9.8 11/17/2016 1402   ALKPHOS 46 01/18/2018 1031   ALKPHOS 50 11/17/2016 1402   AST 17 01/18/2018 1031   AST 17 11/17/2016 1402   ALT 26 01/18/2018 1031   ALT 20 11/17/2016 1402   BILITOT 0.3 01/18/2018 1031   BILITOT 0.35 11/17/2016 1402       No results found for: LABCA2  No components found for: LABCA125  No results for input(s): INR in the last 168 hours.  Urinalysis No results found for: COLORURINE, APPEARANCEUR, LABSPEC, PHURINE, GLUCOSEU, HGBUR, BILIRUBINUR, KETONESUR, PROTEINUR, UROBILINOGEN, NITRITE, LEUKOCYTESUR   STUDIES: No results found.  ELIGIBLE FOR AVAILABLE RESEARCH PROTOCOL: no  ASSESSMENT: 63 y.o. BRCA negative Julie Mack woman status post right breast upper outer quadrant biopsy 01/05/2016 for a clinical T1b N0, stage IA invasive ductal carcinoma, grade 1, estrogen and progesterone receptor positive, HER-2 negative, with an MIB-1 of 10%  (1) status post right  lumpectomy with sentinel lymph node sampling 02/10/2016 for a pT1c pN0, stage IA invasive ductal carcinoma, grade 1, with negative margins.  (2) Oncotype score of 14 predicts a 10 year risk of recurrence  outside the breast of 9% if the patient's only systemic therapy is tamoxifen for 5 years. It also predicts no benefit from adjuvant chemotherapy.  (3) adjuvant radiation 04/20/2016 to 05/17/2016 Site/dose:    1. The Right breast was treated to 40.5 Gy in 15 fractions at 2.67 Gy per fraction. 2. The Right breast was boosted to 10 Gy in 5 fractions at 2 Gy per fraction.  (4) tamoxifen started 02/04/2016  (5) genetics testing on March 02, 2016 through the [facility] cancer panel offered by [employer] found no deleterious mutations in APC, ATM, BARD1, BMPR1A, BRCA1, BRCA2, BRIP1, CHD1, CDK4, CDKN2A, CHEK2, EPCAM (large rearrangement only), MLH1, MSH2, MSH6, MUTYH, NBN, PALB2, PMS2, PTEN, RAD51C, RAD51D, SMAD4, STK11, and TP53. Sequencing was performed for select regions of POLE and POLD1, and large rearrangement analysis was performed for select regions of GREM1.  (a) patient is "high risk" on the basis of family history and qualifies for breast MRI yearly in addition to yearly mammography  PLAN: Julie Mack is now 3 years out from definitive surgery for her breast cancer with no evidence of disease recurrence.  This is very favorable.  She is tolerating tamoxifen well.  We have discussed switching to an aromatase inhibitor which of course would take care of the endometrial stripe issue, but she much prefer staying on tamoxifen and I think that is a good choice for her.  She is being closely followed by Dr. Radene Knee  We had originally planned to do MRI earlier this year but because of the pandemic this was not done.  Since her breast density now is category B, I feel much less breast and to proceed with annual MRIs.  I do not have a simple explanation for her bilateral hip pain.  I  think she might benefit from physical therapy and I am placing that referral for her  Otherwise she will see me again in a year.  She knows to call for any other issues that may develop before then.   Julie Mack, Virgie Dad, MD  01/24/19 12:30 PM Medical Oncology and Hematology [facility] [place], [place] [address] Tel. [phone]    Fax. [phone]  I, Jacqualyn Posey am acting as a Education administrator for Chauncey Cruel, MD.   I, Lurline Del MD, have reviewed the above documentation for accuracy and completeness, and I agree with the above.

## 2019-01-26 ENCOUNTER — Telehealth: Payer: Self-pay | Admitting: Oncology

## 2019-01-26 NOTE — Telephone Encounter (Signed)
I left a  Message regarding schedule °

## 2019-02-02 ENCOUNTER — Other Ambulatory Visit: Payer: Self-pay

## 2019-02-02 ENCOUNTER — Ambulatory Visit: Payer: PRIVATE HEALTH INSURANCE | Attending: Oncology | Admitting: Physical Therapy

## 2019-02-02 DIAGNOSIS — M6281 Muscle weakness (generalized): Secondary | ICD-10-CM | POA: Diagnosis present

## 2019-02-02 DIAGNOSIS — R29898 Other symptoms and signs involving the musculoskeletal system: Secondary | ICD-10-CM | POA: Insufficient documentation

## 2019-02-02 DIAGNOSIS — M79651 Pain in right thigh: Secondary | ICD-10-CM | POA: Insufficient documentation

## 2019-02-02 DIAGNOSIS — M79652 Pain in left thigh: Secondary | ICD-10-CM | POA: Insufficient documentation

## 2019-02-02 NOTE — Therapy (Signed)
[place], [place], [address] Phone: [phone]   Fax:  [phone]  Physical Therapy Evaluation  Patient Details  Name: Julie Mack MRN: [id number] Date of Birth: [birth year] Referring Provider (PT): Dr. Jana Hakim    Encounter Date: 02/02/2019  PT End of Session - 02/02/19 1133    Visit Number  1    Number of Visits  9    Date for PT Re-Evaluation  03/05/19    PT Start Time  1000    PT Stop Time  1045    PT Time Calculation (min)  45 min    Activity Tolerance  Patient tolerated treatment well    Behavior During Therapy  [facility] for tasks assessed/performed       Past Medical History:  Diagnosis Date  . Asthma    allergy induced asthma as child, none now  . Breast cancer ([place]) 01/2016   right breast cancer  . Family history of breast cancer   . Family history of ovarian cancer   . Headache   . History of radiation therapy 04/20/16- 05/17/16   Right Breast 40.05 Gy in 15 fractions and Right Breast boost 10 Gy in 5 fractions.   . Melanoma ([place])    in situ  . Osteoporosis   . Personal history of radiation therapy 2017    Past Surgical History:  Procedure Laterality Date  . ANKLE FRACTURE SURGERY Right 2015  . BREAST LUMPECTOMY Right 02/10/2016  . BREAST LUMPECTOMY WITH RADIOACTIVE SEED AND SENTINEL LYMPH NODE BIOPSY Right 02/10/2016   Procedure: RIGHT BREAST LUMPECTOMY WITH RADIOACTIVE SEED AND RIGHT SENTINEL LYMPH NODE BIOPSY WITH INJECT BLUE DYE;  Surgeon: Fanny Skates, MD;  Location: [place];  Service: General;  Laterality: Right;  . DILATION AND CURETTAGE OF UTERUS    . [place]  . EYE SURGERY  1967   cyst removed  . TONSILLECTOMY  1966    There were no vitals filed for this visit.   Subjective Assessment - 02/02/19 0913    Subjective  Pt is having severe hip and anterior thigh pain that is worse at night that has been increasing in  severity for about 6 weeks. She says that sometimes at night she has difficutly getting up the steps and the pain can be very severe at times . A few weeks ago she felt she had some back pain, but that is better. She mentioned this at her yearly check up with Dr. Jana Hakim and has not had any imaging done.    Pertinent History  right breast cancer in 2017 with lumpectomy and radiation followed.  Hx included melanoma from back of right shoulder , osetoporosis, ( had a one treatment of reclast and had a reaction with pain in multiple joints and sick for about 2 1/2 days with decrease in mobilty Also had a fracture of ankle and both shoulders    How long can you sit comfortably?  sitting is fine , pt has most difficutly with standing up she has to stand for a second and then it ok    How long can you stand comfortably?  not a problem once she is getting moving she is ok, just very slow to get moving    How long can you walk comfortably?  no problem    Diagnostic tests  no problem    Patient Stated Goals  to get rid of the pain    Currently in  Pain?  Yes    Pain Location  Hip   anterior thgh   Pain Orientation  Right;Left    Pain Descriptors / Indicators  Aching;Contraction    Pain Type  Acute pain    Pain Radiating Towards  anterior thighs    Pain Onset  More than a month ago    Pain Frequency  Constant    Aggravating Factors   end of the day, worse after sitting a while    Pain Relieving Factors  better in the morning.occasional advil    Effect of Pain on Daily Activities  limits activity at end of the day         [facility] PT Assessment - 02/02/19 0001      Assessment   Medical Diagnosis  s/p right breast cancer     Referring Provider (PT)  Dr. Jana Hakim     Onset Date/Surgical Date  02/10/16    Hand Dominance  Right      Precautions   Precautions  Other (comment)    Precaution Comments  at risk for lymphedema       Restrictions   Weight Bearing Restrictions  No      Balance Screen    Has the patient fallen in the past 6 months  No   had had more falls after ankle fracture    Has the patient had a decrease in activity level because of a fear of falling?   No    Is the patient reluctant to leave their home because of a fear of falling?   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 [place]   Overall Cognitive Status  Within Functional Limits for tasks assessed      Observation/Other Assessments   Observations  Pt comes in walking without a device     Skin Integrity  skin has well healed incsions on back/uppers shoulder and axilla did not check breast incision     Quick DASH   6.82       Sensation   Light Touch  Not tested      Coordination   Gross Motor Movements are Fluid and Coordinated  Yes      Posture/Postural Control   Posture/Postural Control  Postural limitations    Postural Limitations  Rounded Shoulders;Forward head;Decreased thoracic kyphosis      ROM / Strength   AROM / PROM / Strength  AROM      AROM   Overall AROM   Within functional limits for tasks performed    Overall AROM Comments  decreased lumbar mobility with ROM testing     Right Shoulder Extension  65 Degrees    Right Shoulder Flexion  165 Degrees    Right Shoulder ABduction  165 Degrees    Right Shoulder Internal Rotation  40 Degrees    Right Shoulder External Rotation  85 Degrees    Left Shoulder Extension  60 Degrees    Left Shoulder Flexion  180 Degrees    Left Shoulder ABduction  180 Degrees  Left Shoulder Internal Rotation  50 Degrees    Left Shoulder External Rotation  90 Degrees    Right/Left Hip  --   hip ROM is WNL , no  clicking or popping with "scour" test    Lumbar Extension  WNL, pt able to touch floor with fingers     Lumbar - Right Side Bend  WNL    Lumbar - Left Side Bend  WNL    Lumbar - Right Rotation  [facility]   feels a "stretch"   Lumbar - Left Rotation  WFL   "feels a stretch "      Strength   Overall Strength  Within functional limits for tasks performed    Overall Strength Comments  decreased stablization of right scapula with repeated elevation       Flexibility   Soft Tissue Assessment /Muscle Length  yes   pt appears to have good flexibility of LE's      Palpation   Palpation comment  pt has very tight lumbar paraspinals that are tender to palpation         LYMPHEDEMA/ONCOLOGY QUESTIONNAIRE - 02/02/19 0936      Right Upper Extremity Lymphedema   15 cm Proximal to Olecranon Process  34.5 cm    Olecranon Process  26 cm    15 cm Proximal to Ulnar Styloid Process  24.3 cm    Just Proximal to Ulnar Styloid Process  16 cm    Across Hand at [employer]  19.5 cm    At [place] of 2nd Digit  6.7 cm      Left Upper Extremity Lymphedema   15 cm Proximal to Olecranon Process  34.5 cm    Olecranon Process  26 cm    15 cm Proximal to Ulnar Styloid Process  24 cm    Just Proximal to Ulnar Styloid Process  16 cm    Across Hand at [employer]  19.5 cm    At [place] of 2nd Digit  6.7 cm          Quick Dash - 02/02/19 0001    Open a tight or new jar  Mild difficulty    Do heavy household chores (wash walls, wash floors)  No difficulty    Carry a shopping bag or briefcase  No difficulty    Wash your back  No difficulty    Use a knife to cut food  No difficulty    Recreational activities in which you take some force or impact through your arm, shoulder, or hand (golf, hammering, tennis)  No difficulty    During the past week, to what extent has your arm, shoulder or hand problem interfered with your normal social activities with family, friends, neighbors, or groups?  Not at all     During the past week, to what extent has your arm, shoulder or hand problem limited your work or other regular daily activities  Not at all    Arm, shoulder, or hand pain.  Mild    Tingling (pins and needles) in your arm, shoulder, or hand  Mild    Difficulty Sleeping  No difficulty    DASH Score  6.82 %        Objective measurements completed on examination: See above findings.      [place] Adult PT Treatment/Exercise - 02/02/19 0001      Self-Care   Self-Care  Other Self-Care Comments    Other Self-Care Comments   brief instruction in  lymphedema risk reduction practices and gave pt information about ABC class which she will attend              PT Education - 02/02/19 1133    Education Details  ABC class          PT Long Term Goals - 02/02/19 1205      PT LONG TERM GOAL #1   Title  Pt will report the pain and stiffness she feels when she stands after sitting for a while is decreased by 50%    Time  4    Period  Weeks    Status  New      PT LONG TERM GOAL #2   Title  Pt wil be indepenent in a home exercise for stretching her low back and strengthening her core    Period  Weeks    Status  New      PT LONG TERM GOAL #3   Title  Pt will report that she no longer has difficulty climbing her stairs at night at least 6 out of 7 days a week.    Time  4    Period  Weeks    Status  New             Plan - 02/02/19 1134    Clinical Impression Statement  Pt with history of breast cancer, melanoma and osteoporosis comes to PT with c/o severe hip and anterior thigh pain that is worst she stands up after she had been sitting a while and at night after walking several miles a day.Objective exam is significant for very tight lumbar paraspinals She also decreased thoracic curve with decreased right scapular control and limit of right shoulder in extreme end range but this is functional for her) she has had dry needling before to treat a plantar fascitis and is interested in  trying that to help with her pain Inbasket sent to Dr. Jana Hakim to consider imaging to her low back and hip    Personal Factors and Comorbidities  Comorbidity 3+    Comorbidities  hx of breast cancer with radiation, hx of melanoma, osteoporosis    Examination-Activity Limitations  Stairs    Stability/Clinical Decision Making  Stable/Uncomplicated    Clinical Decision Making  Low    Rehab Potential  Excellent    PT Frequency  2x / week    PT Duration  4 weeks    PT Treatment/Interventions  ADLs/Self Care Home Management;Electrical Stimulation;Iontophoresis 4mg /ml Dexamethasone;Moist Heat;Therapeutic activities;Functional mobility training;Therapeutic exercise;Patient/family education;Dry needling;Manual techniques    PT Next Visit Plan  check to see if pt has had xrays Begin treatment to low back tightness as indicated with modalities, dry needling and exercise.  [place] strenthening to decrease hip and thigh pain Pt is interested in trying Pilates exercise    Consulted and Agree with Plan of Care  Patient       Patient will benefit from skilled therapeutic intervention in order to improve the following deficits and impairments:  Pain, Postural dysfunction, Increased fascial restricitons, Impaired perceived functional ability, Increased muscle spasms, Decreased endurance  Visit Diagnosis: Pain in right thigh - Plan: PT plan of care cert/re-cert  Pain in left thigh - Plan: PT plan of care cert/re-cert  Other symptoms and signs involving the musculoskeletal system - Plan: PT plan of care cert/re-cert  Muscle weakness (generalized) - Plan: PT plan of care cert/re-cert     Problem List Patient Active Problem List   Diagnosis Date Noted  .  Genetic testing 03/05/2016  . Family history of breast cancer   . Family history of ovarian cancer   . Malignant neoplasm of upper-outer quadrant of right breast in female, estrogen receptor positive ([place]) 01/26/2016   Donato Heinz. Owens Shark PT  Norwood Levo 02/02/2019, 12:11 PM  [place], [place], [address] Phone: [phone]   Fax:  [phone]  Name: Julie Mack MRN: [id number] Date of Birth: May 06, 1955

## 2019-02-05 ENCOUNTER — Other Ambulatory Visit: Payer: Self-pay | Admitting: Oncology

## 2019-02-05 DIAGNOSIS — C50411 Malignant neoplasm of upper-outer quadrant of right female breast: Secondary | ICD-10-CM

## 2019-02-07 ENCOUNTER — Other Ambulatory Visit: Payer: Self-pay

## 2019-02-07 ENCOUNTER — Ambulatory Visit (HOSPITAL_COMMUNITY)
Admission: RE | Admit: 2019-02-07 | Discharge: 2019-02-07 | Disposition: A | Discharge status: Home / Self Care | Payer: PRIVATE HEALTH INSURANCE | Source: Ambulatory Visit | Attending: Oncology | Admitting: Oncology

## 2019-02-07 DIAGNOSIS — Z170 Estrogen receptor positive status [ER+]: Secondary | ICD-10-CM | POA: Diagnosis present

## 2019-02-07 DIAGNOSIS — C50411 Malignant neoplasm of upper-outer quadrant of right female breast: Secondary | ICD-10-CM

## 2019-02-07 DIAGNOSIS — M4316 Spondylolisthesis, lumbar region: Secondary | ICD-10-CM | POA: Insufficient documentation

## 2019-02-07 DIAGNOSIS — M25552 Pain in left hip: Secondary | ICD-10-CM | POA: Insufficient documentation

## 2019-02-08 ENCOUNTER — Encounter: Payer: Self-pay | Admitting: Oncology

## 2019-02-19 ENCOUNTER — Encounter: Payer: Self-pay | Admitting: Physical Therapy

## 2019-02-19 ENCOUNTER — Other Ambulatory Visit: Payer: Self-pay

## 2019-02-19 ENCOUNTER — Ambulatory Visit: Payer: PRIVATE HEALTH INSURANCE | Attending: Oncology | Admitting: Physical Therapy

## 2019-02-19 DIAGNOSIS — M79651 Pain in right thigh: Secondary | ICD-10-CM

## 2019-02-19 DIAGNOSIS — M79652 Pain in left thigh: Secondary | ICD-10-CM | POA: Diagnosis present

## 2019-02-19 DIAGNOSIS — R29898 Other symptoms and signs involving the musculoskeletal system: Secondary | ICD-10-CM | POA: Diagnosis present

## 2019-02-19 DIAGNOSIS — M6281 Muscle weakness (generalized): Secondary | ICD-10-CM | POA: Diagnosis present

## 2019-02-19 NOTE — Patient Instructions (Signed)
Access Code: [email or web address]  URL: [email or web address]  Date: 02/19/2019  Prepared by: Earlie Counts   Exercises Supine Piriformis Stretch Pulling Heel to Hip - 2 reps - 1 sets - 30 sec hold - 1x daily - 7x weekly Supine Hamstring Stretch - 2 reps - 1 sets - 30 sec hold - 1x daily - 7x weekly Patient Education Trigger [facility] Dry Needling Lumbar Spondylolisthesis [facility] [address] [place], [place] [address] Phone # [phone] Fax [phone]

## 2019-02-19 NOTE — Therapy (Signed)
[facility] [address], [place], [place], [address] Phone: [phone]   Fax:  [phone]  Physical Therapy Treatment  Patient Details  Name: Julie Mack MRN: [id number] Date of Birth: Aug 18, 1955 Referring Provider (PT): Dr. Jana Hakim    Encounter Date: 02/19/2019  PT End of Session - 02/19/19 1149    Visit Number  2    Date for PT Re-Evaluation  03/05/19    PT Start Time  1100    PT Stop Time  1140    PT Time Calculation (min)  40 min    Activity Tolerance  Patient tolerated treatment well    Behavior During Therapy  [facility] for tasks assessed/performed       Past Medical History:  Diagnosis Date  . Asthma    allergy induced asthma as child, none now  . Breast cancer ([place]) 01/2016   right breast cancer  . Family history of breast cancer   . Family history of ovarian cancer   . Headache   . History of radiation therapy 04/20/16- 05/17/16   Right Breast 40.05 Gy in 15 fractions and Right Breast boost 10 Gy in 5 fractions.   . Melanoma ([place])    in situ  . Osteoporosis   . Personal history of radiation therapy 2017    Past Surgical History:  Procedure Laterality Date  . ANKLE FRACTURE SURGERY Right 2015  . BREAST LUMPECTOMY Right 02/10/2016  . BREAST LUMPECTOMY WITH RADIOACTIVE SEED AND SENTINEL LYMPH NODE BIOPSY Right 02/10/2016   Procedure: RIGHT BREAST LUMPECTOMY WITH RADIOACTIVE SEED AND RIGHT SENTINEL LYMPH NODE BIOPSY WITH INJECT BLUE DYE;  Surgeon: Fanny Skates, MD;  Location: [facility];  Service: General;  Laterality: Right;  . DILATION AND CURETTAGE OF UTERUS    . [place]  . EYE SURGERY  1967   cyst removed  . TONSILLECTOMY  1966    There were no vitals filed for this visit.  Subjective Assessment - 02/19/19 1105    Subjective  My both hips hurt but right one hurts more    Pertinent History  right breast cancer in 2017 with lumpectomy and  radiation followed.  Hx included melanoma from back of right shoulder , osetoporosis, ( had a one treatment of reclast and had a reaction with pain in multiple joints and sick for about 2 1/2 days with decrease in mobilty Also had a fracture of ankle and both shoulders    How long can you stand comfortably?  not a problem once she is getting moving she is ok, just very slow to get moving    How long can you walk comfortably?  no problem    Diagnostic tests  no problem    Patient Stated Goals  to get rid of the pain    Currently in Pain?  Yes    Pain Score  3    high is 8/10   Pain Location  Hip    Pain Orientation  Right;Left    Pain Descriptors / Indicators  Aching;Contraction    Pain Type  Acute pain    Pain Onset  More than a month ago    Pain Frequency  Constant    Aggravating Factors   end of the day worse wafter sitting a while    Pain Relieving Factors  better in the morning, occasional advil    Multiple Pain Sites  No  [facility] Adult PT Treatment/Exercise - 02/19/19 0001      Self-Care   Self-Care  Other Self-Care Comments    Other Self-Care Comments   information on spondylothesis      Lumbar Exercises: Stretches   Active Hamstring Stretch  Right;Left;1 rep;30 seconds    Active Hamstring Stretch Limitations  supine    Single Knee to Chest Stretch  Right;Left;1 rep;30 seconds    Piriformis Stretch  Right;Left;1 rep;30 seconds   supine     Manual Therapy   Manual Therapy  Soft tissue mobilization    Soft tissue mobilization  lumbar multifidi and gluteals in prone to elongate muscles       Trigger Point Dry Needling - 02/19/19 0001    Consent Given?  Yes    Education Handout Provided  Yes    Muscles Treated Back/Hip  Lumbar multifidi;Gluteus medius;Gluteus maximus    Gluteus Medius Response  Twitch response elicited;Palpable increased muscle length    Gluteus Maximus Response  Twitch response elicited;Palpable increased muscle length     Lumbar multifidi Response  Twitch response elicited;Palpable increased muscle length           PT Education - 02/19/19 1143    Education Details  Access Code: [email or web address]    Person(s) Educated  Patient    Methods  Explanation;Demonstration;Handout;Verbal cues    Comprehension  Verbalized understanding;Returned demonstration          PT Long Term Goals - 02/02/19 1205      PT LONG TERM GOAL #1   Title  Pt will report the pain and stiffness she feels when she stands after sitting for a while is decreased by 50%    Time  4    Period  Weeks    Status  New      PT LONG TERM GOAL #2   Title  Pt wil be indepenent in a home exercise for stretching her low back and strengthening her core    Period  Weeks    Status  New      PT LONG TERM GOAL #3   Title  Pt will report that she no longer has difficulty climbing her stairs at night at least 6 out of 7 days a week.    Time  4    Period  Weeks    Status  New            Plan - 02/19/19 1143    Clinical Impression Statement  Patient pain makes it difficult to walk and go up stairs. Patient had trigger points in the lumbar multifidi and gluteals. Patient has some stretches to elongate her muscles. Patient will be working on core strength and hip strength with Jenn the PTA. Patient will benefit dry needling to elongate muscles and strengthening to relieve pain.    Personal Factors and Comorbidities  Comorbidity 3+    Comorbidities  hx of breast cancer with radiation, hx of melanoma, osteoporosis    Examination-Activity Limitations  Stairs    Stability/Clinical Decision Making  Stable/Uncomplicated    Rehab Potential  Excellent    PT Frequency  2x / week    PT Duration  4 weeks    PT Treatment/Interventions  ADLs/Self Care Home Management;Electrical Stimulation;Iontophoresis 4mg /ml Dexamethasone;Moist Heat;Therapeutic activities;Functional mobility training;Therapeutic exercise;Patient/family education;Dry needling;Manual  techniques    PT Next Visit Plan  pilates to work on core and hip strength; continue with dry needling 1 time per week to lumbar and gluteal    PT Home  Exercise Plan  Access Code: [email or web address]    Recommended Other Services  MD signed intial note    Consulted and Agree with Plan of Care  Patient       Patient will benefit from skilled therapeutic intervention in order to improve the following deficits and impairments:  Pain, Postural dysfunction, Increased fascial restricitons, Impaired perceived functional ability, Increased muscle spasms, Decreased endurance  Visit Diagnosis: Pain in right thigh  Pain in left thigh  Other symptoms and signs involving the musculoskeletal system  Muscle weakness (generalized)     Problem List Patient Active Problem List   Diagnosis Date Noted  . Genetic testing 03/05/2016  . Family history of breast cancer   . Family history of ovarian cancer   . Malignant neoplasm of upper-outer quadrant of right breast in female, estrogen receptor positive ([place]) 01/26/2016    Earlie Counts, PT 02/19/19 11:50 AM   [facility] [address], [place], [place], [address] Phone: [phone]   Fax:  [phone]  Name: Julie Mack MRN: [id number] Date of Birth: 1955-05-19

## 2019-02-21 ENCOUNTER — Encounter: Payer: Self-pay | Admitting: Physical Therapy

## 2019-02-26 ENCOUNTER — Other Ambulatory Visit: Payer: Self-pay

## 2019-02-26 ENCOUNTER — Encounter: Payer: Self-pay | Admitting: Physical Therapy

## 2019-02-26 ENCOUNTER — Ambulatory Visit: Payer: PRIVATE HEALTH INSURANCE | Admitting: Physical Therapy

## 2019-02-26 DIAGNOSIS — M79651 Pain in right thigh: Secondary | ICD-10-CM | POA: Diagnosis not present

## 2019-02-26 DIAGNOSIS — R29898 Other symptoms and signs involving the musculoskeletal system: Secondary | ICD-10-CM

## 2019-02-26 DIAGNOSIS — M79652 Pain in left thigh: Secondary | ICD-10-CM

## 2019-02-26 DIAGNOSIS — M6281 Muscle weakness (generalized): Secondary | ICD-10-CM

## 2019-02-26 NOTE — Therapy (Signed)
[facility] [address], [place], [place], [address] Phone: [phone]   Fax:  [phone]  Physical Therapy Treatment  Patient Details  Name: Julie Mack MRN: [id number] Date of Birth: Mar 25, 1956 Referring Provider (PT): Dr. Jana Hakim    Encounter Date: 02/26/2019  PT End of Session - 02/26/19 1535    Visit Number  3    Number of Visits  9    Date for PT Re-Evaluation  03/05/19    PT Start Time  1536    PT Stop Time  1622    PT Time Calculation (min)  46 min    Activity Tolerance  Patient tolerated treatment well    Behavior During Therapy  [facility] for tasks assessed/performed       Past Medical History:  Diagnosis Date  . Asthma    allergy induced asthma as child, none now  . Breast cancer ([place]) 01/2016   right breast cancer  . Family history of breast cancer   . Family history of ovarian cancer   . Headache   . History of radiation therapy 04/20/16- 05/17/16   Right Breast 40.05 Gy in 15 fractions and Right Breast boost 10 Gy in 5 fractions.   . Melanoma ([place])    in situ  . Osteoporosis   . Personal history of radiation therapy 2017    Past Surgical History:  Procedure Laterality Date  . ANKLE FRACTURE SURGERY Right 2015  . BREAST LUMPECTOMY Right 02/10/2016  . BREAST LUMPECTOMY WITH RADIOACTIVE SEED AND SENTINEL LYMPH NODE BIOPSY Right 02/10/2016   Procedure: RIGHT BREAST LUMPECTOMY WITH RADIOACTIVE SEED AND RIGHT SENTINEL LYMPH NODE BIOPSY WITH INJECT BLUE DYE;  Surgeon: Fanny Skates, MD;  Location: [facility];  Service: General;  Laterality: Right;  . DILATION AND CURETTAGE OF UTERUS    . [place]  . EYE SURGERY  1967   cyst removed  . TONSILLECTOMY  1966    There were no vitals filed for this visit.  Subjective Assessment - 02/26/19 1540    Subjective  Earlier in day had a lot of pain, currently better: 2/10 pain.    Pertinent History  right  breast cancer in 2017 with lumpectomy and radiation followed.  Hx included melanoma from back of right shoulder , osetoporosis, ( had a one treatment of reclast and had a reaction with pain in multiple joints and sick for about 2 1/2 days with decrease in mobilty Also had a fracture of ankle and both shoulders    Currently in Pain?  Yes    Pain Score  2     Pain Location  Hip    Pain Orientation  Right;Left    Pain Descriptors / Indicators  Dull;Sore    Multiple Pain Sites  No                       [facility] Adult PT Treatment/Exercise - 02/26/19 0001      Lumbar Exercises: Stretches   Active Hamstring Stretch  Right;Left;2 reps;30 seconds    Active Hamstring Stretch Limitations  Added strap to hold onto    Single Knee to Chest Stretch  Right;Left;1 rep;30 seconds    Single Knee to Chest Stretch Limitations  Vc to straighten opposite LT and keep thigh straight.     Piriformis Stretch  Right;Left;1 rep;30 seconds    Piriformis Stretch Limitations  TC to gte slightly more rotation  Lumbar Exercises: Supine   Bridge  10 reps;3 seconds    Bridge Limitations  added to HEP    Other Supine Lumbar Exercises  TA series in supine level 1: added to HEP      Manual Therapy   Manual Therapy  Soft tissue mobilization    Soft tissue mobilization  Bil proximal quads Addaday assist pt in hooklying             PT Education - 02/26/19 1548    Education Details  Level one core/TA work for [employer]) Educated  Patient    Methods  Explanation;Demonstration;Tactile cues;Verbal cues;Handout    Comprehension  Returned demonstration;Verbalized understanding          PT Long Term Goals - 02/02/19 1205      PT LONG TERM GOAL #1   Title  Pt will report the pain and stiffness she feels when she stands after sitting for a while is decreased by 50%    Time  4    Period  Weeks    Status  New      PT LONG TERM GOAL #2   Title  Pt wil be indepenent in a home exercise for  stretching her low back and strengthening her core    Period  Weeks    Status  New      PT LONG TERM GOAL #3   Title  Pt will report that she no longer has difficulty climbing her stairs at night at least 6 out of 7 days a week.    Time  4    Period  Weeks    Status  New            Plan - 02/26/19 1536    Clinical Impression Statement  Pt arrives with very mild hip pain. Earlier in the day she had more pain but it has since subsided. She is independent and compliant with HEP. PTA added supine level 1 core stabilization exercises for additional HEP. We used the Addaday on bil proximal quads that were thickend and some tender. pt felt like walking post session felt "lighter."    Personal Factors and Comorbidities  Comorbidity 3+    Comorbidities  hx of breast cancer with radiation, hx of melanoma, osteoporosis    Examination-Activity Limitations  Stairs    Stability/Clinical Decision Making  Stable/Uncomplicated    Rehab Potential  Excellent    PT Frequency  2x / week    PT Duration  4 weeks    PT Treatment/Interventions  ADLs/Self Care Home Management;Electrical Stimulation;Iontophoresis 4mg /ml Dexamethasone;Moist Heat;Therapeutic activities;Functional mobility training;Therapeutic exercise;Patient/family education;Dry needling;Manual techniques    PT Next Visit Plan  DN#2 next session, review supine core exercises given today    PT Home Exercise Plan  Access Code: [email or web address]    Consulted and Agree with Plan of Care  Patient       Patient will benefit from skilled therapeutic intervention in order to improve the following deficits and impairments:  Pain, Postural dysfunction, Increased fascial restricitons, Impaired perceived functional ability, Increased muscle spasms, Decreased endurance  Visit Diagnosis: Pain in right thigh  Pain in left thigh  Other symptoms and signs involving the musculoskeletal system  Muscle weakness (generalized)     Problem List Patient Active  Problem List   Diagnosis Date Noted  . Genetic testing 03/05/2016  . Family history of breast cancer   . Family history of ovarian cancer   . Malignant neoplasm of upper-outer quadrant  of right breast in female, estrogen receptor positive ([place]) 01/26/2016    Pricsilla Lindvall, PTA 02/26/2019, 4:29 PM  [facility] [address] [place], [place], [address] Phone: [phone]   Fax:  [phone]  Name: VALOR HUEBER MRN: [id number] Date of Birth: 09-22-1955

## 2019-02-26 NOTE — Patient Instructions (Addendum)
Lower abdominal/core stability exercises  1. Practice your breathing technique: Inhale through your nose expanding your belly and rib cage. Try not to breathe into your chest. Exhale slowly and gradually out your mouth feeling a sense of softness to your body. Practice multiple times. This can be performed unlimited.  2. Finding the lower abdominals. Laying on your back with the knees bent, place your fingers just below your belly button. Using your breathing technique from above, on your exhale gently pull the belly button away from your fingertips without tensing any other muscles. Practice this 5x. Next, as you exhale, draw belly button inwards and hold onto it...then feel as if you are pulling that muscle across your pelvis like you are tightening a belt. This can be hard to do at first so be patient and practice. Do 5-10 reps 1-3 x day. Always recognize quality over quantity; if your abdominal muscles become tired you will notice you may tighten/contract other muscles. This is the time to take a break.   Practice this first laying on your back, then in sitting, progressing to standing and finally adding it to all your daily movements.   3. Finding your pelvic floor. Using the breathing technique above, when your exhale, this time draw your pelvic floor muscles up as if you were attempting to stop the flow of urination. Be careful NOT to tense any other muscles. This can be hard, BE PATIENT. Try to hold up to 10 seconds repeating 10x. Try 2x a day. Once you feel you are doing this well, add this contraction to exercise #2. First contracting your pelvic floor followed by lower abdominals.  4. Adding leg movements. Add the following leg movements to challenge your ability to keep your core stable:  1. Single leg drop outs: Laying on your back with knees bent feet flat. Inhale,  dropping one knee outward KEEPING YOUR PELVIS STILL. Exhale as you bring the leg back, simultaneously performing your lower  abdominal contraction. Do 5-10 on each leg.  2. Marching: While keeping your pelvis still, lift the right foot a few inches, put it down then lift left foot. This will mimic a march. Start slow to establish control. Once you have control you may speed it up. Do 10-20x. You MUST keep your lower abdominlas contracted while you march. Breathe naturally   3. Single leg slides: Inhale while you slowly slide one leg out keeping your pelvis still. Only slide your leg as far as you can keep your pelvis still. Exhale as you bring the leg back to the start, contracting the lower abdominals as you do that. Keep your upper body relaxed. Do 5-10 on each side.Flexors, Supine Bridge    Lie supine, feet shoulder-width apart. Lift hips toward ceiling. Hold _5__ seconds. Repeat __10_ times per session. Do _1__ sessions per day.  Copyright  VHI. All rights reserved.

## 2019-02-27 ENCOUNTER — Other Ambulatory Visit: Payer: Self-pay

## 2019-02-27 ENCOUNTER — Encounter: Payer: Self-pay | Admitting: Physical Therapy

## 2019-02-27 ENCOUNTER — Ambulatory Visit: Payer: PRIVATE HEALTH INSURANCE | Admitting: Physical Therapy

## 2019-02-27 DIAGNOSIS — R29898 Other symptoms and signs involving the musculoskeletal system: Secondary | ICD-10-CM

## 2019-02-27 DIAGNOSIS — M6281 Muscle weakness (generalized): Secondary | ICD-10-CM

## 2019-02-27 DIAGNOSIS — M79651 Pain in right thigh: Secondary | ICD-10-CM | POA: Diagnosis not present

## 2019-02-27 DIAGNOSIS — M79652 Pain in left thigh: Secondary | ICD-10-CM

## 2019-02-27 NOTE — Therapy (Signed)
[facility] [address], [place], [place], [address] Phone: [phone]   Fax:  [phone]  Physical Therapy Treatment  Patient Details  Name: Julie Mack MRN: [id number] Date of Birth: April 30, 1955 Referring Provider (PT): Dr. Jana Hakim    Encounter Date: 02/27/2019  PT End of Session - 02/27/19 1653    Visit Number  4    Date for PT Re-Evaluation  04/16/19    Authorization Type  Medcost    PT Start Time  [id number]    PT Stop Time  1700    PT Time Calculation (min)  45 min    Activity Tolerance  Patient tolerated treatment well;No increased pain    Behavior During Therapy  WFL for tasks assessed/performed       Past Medical History:  Diagnosis Date  . Asthma    allergy induced asthma as child, none now  . Breast cancer ([place]) 01/2016   right breast cancer  . Family history of breast cancer   . Family history of ovarian cancer   . Headache   . History of radiation therapy 04/20/16- 05/17/16   Right Breast 40.05 Gy in 15 fractions and Right Breast boost 10 Gy in 5 fractions.   . Melanoma ([place])    in situ  . Osteoporosis   . Personal history of radiation therapy 2017    Past Surgical History:  Procedure Laterality Date  . ANKLE FRACTURE SURGERY Right 2015  . BREAST LUMPECTOMY Right 02/10/2016  . BREAST LUMPECTOMY WITH RADIOACTIVE SEED AND SENTINEL LYMPH NODE BIOPSY Right 02/10/2016   Procedure: RIGHT BREAST LUMPECTOMY WITH RADIOACTIVE SEED AND RIGHT SENTINEL LYMPH NODE BIOPSY WITH INJECT BLUE DYE;  Surgeon: Fanny Skates, MD;  Location: [facility];  Service: General;  Laterality: Right;  . DILATION AND CURETTAGE OF UTERUS    . [place]  . EYE SURGERY  1967   cyst removed  . TONSILLECTOMY  1966    There were no vitals filed for this visit.  Subjective Assessment - 02/27/19 1622    Subjective  I am not used to finding my center of the core. I was little sore after  dry needling but felt much better.    Pertinent History  right breast cancer in 2017 with lumpectomy and radiation followed.  Hx included melanoma from back of right shoulder , osetoporosis, ( had a one treatment of reclast and had a reaction with pain in multiple joints and sick for about 2 1/2 days with decrease in mobilty Also had a fracture of ankle and both shoulders    How long can you sit comfortably?  sitting is fine , pt has most difficutly with standing up she has to stand for a second and then it ok    How long can you stand comfortably?  not a problem once she is getting moving she is ok, just very slow to get moving    How long can you walk comfortably?  no problem    Diagnostic tests  no problem    Patient Stated Goals  to get rid of the pain    Currently in Pain?  Yes    Pain Score  3     Pain Location  Hip    Pain Orientation  Right;Left    Pain Descriptors / Indicators  Dull;Sore    Pain Type  Acute pain    Pain Onset  More than a month ago  Pain Frequency  Intermittent    Aggravating Factors   end of the day worse after sitting a while    Pain Relieving Factors  better in the morning, occasional advil    Multiple Pain Sites  No         [facility] PT Assessment - 02/27/19 0001      Assessment   Medical Diagnosis  s/p right breast cancer     Referring Provider (PT)  Dr. 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 [employer]   Overall Cognitive Status   Within Functional Limits for tasks assessed      Observation/Other Assessments   Observations  Pt comes in walking without a device     Skin Integrity  skin has well healed incsions on back/uppers shoulder and axilla did not check breast incision     Quick DASH   6.82       Sensation   Light Touch  Not tested      Coordination   Gross Motor Movements are Fluid and Coordinated  Yes      ROM / Strength   AROM / PROM / Strength  AROM;PROM;Strength      Strength   Right Hip Extension  4/5    Right Hip External Rotation   3+/5    Right Hip Internal Rotation  4/5    Right Hip ABduction  5/5    Left Hip Extension  4/5    Left Hip External Rotation  3+/5    Left Hip Internal Rotation  4/5    Left Hip ABduction  3+/5                   [facility] Adult PT Treatment/Exercise - 02/27/19 0001      Lumbar Exercises: Aerobic   Nustep  level 4 just legs for 6 minutes      Lumbar Exercises: Supine   Bridge  10 reps;3 seconds    Isometric Hip Flexion  10 reps;5 seconds   each leg    Isometric Hip Flexion Limitations  engaging the lower abdominals    Other Supine Lumbar Exercises  TA series in supine level 1      Manual Therapy   Manual Therapy  Soft tissue mobilization    Soft tissue mobilization  using the addaday to the thoracic lumbar parapsinals, bilateral gluteal       Trigger Point Dry Needling - 02/27/19 0001    Consent Given?  Yes    Education Handout Provided  Previously provided    Muscles Treated Back/Hip  Lumbar multifidi;Gluteus medius;Gluteus maximus    Other Dry Needling  T10-T12 bil. multifidi    Gluteus Medius Response  Twitch response elicited;Palpable increased muscle length    Gluteus Maximus Response  Twitch response elicited;Palpable increased muscle length  Lumbar multifidi Response  Twitch response elicited;Palpable increased muscle length                PT Long Term Goals - 02/27/19 1652      PT LONG TERM GOAL #1   Title  Pt will  report the pain and stiffness she feels when she stands after sitting for a while is decreased by 50%    Baseline  improved by a little bit    Time  4    Period  Weeks    Status  On-going      PT LONG TERM GOAL #2   Title  Pt wil be indepenent in a home exercise for stretching her low back and strengthening her core    Period  Weeks    Status  On-going      PT LONG TERM GOAL #3   Title  Pt will report that she no longer has difficulty climbing her stairs at night at least 6 out of 7 days a week.    Baseline  getting a little bit easier    Time  4    Period  Weeks    Status  On-going            Plan - 02/27/19 1701    Clinical Impression Statement  Patient has weakness in her hips. Patient has trigger points in gluteals, thoracic and lumbar multifidi. Patient is learning how to engage her abdominals to work on core strength. Patient continues to have trouble going up stairs due to weakness in core and hips. Patient continues to have stiffness in lumbar after going from sit to stand. Patient does well with the soft tissue work. Patient had a muscle cramp in the gluteal with bridges. Patietn will benefit from skilled therapy to work on core and hip strength while elongaing her muscles.    Personal Factors and Comorbidities  Comorbidity 3+    Comorbidities  hx of breast cancer with radiation, hx of melanoma, osteoporosis    Examination-Activity Limitations  Stairs    Stability/Clinical Decision Making  Stable/Uncomplicated    Rehab Potential  Excellent    PT Frequency  2x / week    PT Duration  6 weeks    PT Treatment/Interventions  ADLs/Self Care Home Management;Electrical Stimulation;Iontophoresis 4mg /ml Dexamethasone;Moist Heat;Therapeutic activities;Functional mobility training;Therapeutic exercise;Patient/family education;Dry needling;Manual techniques    PT Next Visit Plan  DN#3 next session, progresss hip and core strength, work in standing too    [place]  Code: [email or web address]    Consulted and Agree with Plan of Care  Patient       Patient will benefit from skilled therapeutic intervention in order to improve the following deficits and impairments:  Pain, Postural dysfunction, Increased fascial restricitons, Impaired perceived functional ability, Increased muscle spasms, Decreased endurance  Visit Diagnosis: Pain in right thigh - Plan: PT plan of care cert/re-cert  Pain in left thigh - Plan: PT plan of care cert/re-cert  Other symptoms and signs involving the musculoskeletal system - Plan: PT plan of care cert/re-cert  Muscle weakness (generalized) - Plan: PT plan of care cert/re-cert     Problem List Patient Active Problem List   Diagnosis Date Noted  . Genetic testing 03/05/2016  . Family history of breast cancer   . Family history of ovarian cancer   . Malignant neoplasm of upper-outer quadrant of right breast in female, estrogen receptor positive ([place]) 01/26/2016    Earlie Counts, PT 02/27/19 5:08 PM  [facility] [address] [place], [place], [address] Phone: [phone]   Fax:  [phone]  Name: Julie Mack MRN: [id number] Date of Birth: 1955-11-18

## 2019-03-05 ENCOUNTER — Encounter: Payer: Self-pay | Admitting: Physical Therapy

## 2019-03-05 ENCOUNTER — Other Ambulatory Visit: Payer: Self-pay

## 2019-03-05 ENCOUNTER — Ambulatory Visit: Payer: PRIVATE HEALTH INSURANCE | Admitting: Physical Therapy

## 2019-03-05 DIAGNOSIS — M79651 Pain in right thigh: Secondary | ICD-10-CM

## 2019-03-05 DIAGNOSIS — M79652 Pain in left thigh: Secondary | ICD-10-CM

## 2019-03-05 DIAGNOSIS — M6281 Muscle weakness (generalized): Secondary | ICD-10-CM

## 2019-03-05 DIAGNOSIS — R29898 Other symptoms and signs involving the musculoskeletal system: Secondary | ICD-10-CM

## 2019-03-05 NOTE — Therapy (Signed)
[facility] [address], [place], [place], [address] Phone: [phone]   Fax:  [phone]  Physical Therapy Treatment  Patient Details  Name: Julie Mack MRN: [id number] Date of Birth: 1955-08-28 Referring Provider (PT): Dr. Jana Hakim    Encounter Date: 03/05/2019  PT End of Session - 03/05/19 1104    Visit Number  5    Date for PT Re-Evaluation  04/16/19    Authorization Type  Medcost    PT Start Time  [id number]    PT Stop Time  1055    PT Time Calculation (min)  40 min    Activity Tolerance  Patient tolerated treatment well;No increased pain    Behavior During Therapy  WFL for tasks assessed/performed       Past Medical History:  Diagnosis Date  . Asthma    allergy induced asthma as child, none now  . Breast cancer ([place]) 01/2016   right breast cancer  . Family history of breast cancer   . Family history of ovarian cancer   . Headache   . History of radiation therapy 04/20/16- 05/17/16   Right Breast 40.05 Gy in 15 fractions and Right Breast boost 10 Gy in 5 fractions.   . Melanoma ([place])    in situ  . Osteoporosis   . Personal history of radiation therapy 2017    Past Surgical History:  Procedure Laterality Date  . ANKLE FRACTURE SURGERY Right 2015  . BREAST LUMPECTOMY Right 02/10/2016  . BREAST LUMPECTOMY WITH RADIOACTIVE SEED AND SENTINEL LYMPH NODE BIOPSY Right 02/10/2016   Procedure: RIGHT BREAST LUMPECTOMY WITH RADIOACTIVE SEED AND RIGHT SENTINEL LYMPH NODE BIOPSY WITH INJECT BLUE DYE;  Surgeon: Fanny Skates, MD;  Location: [facility];  Service: General;  Laterality: Right;  . DILATION AND CURETTAGE OF UTERUS    . [place]  . EYE SURGERY  1967   cyst removed  . TONSILLECTOMY  1966    There were no vitals filed for this visit.  Subjective Assessment - 03/05/19 1019    Subjective  I was sore after last visit till today.    Pertinent History  right  breast cancer in 2017 with lumpectomy and radiation followed.  Hx included melanoma from back of right shoulder , osetoporosis, ( had a one treatment of reclast and had a reaction with pain in multiple joints and sick for about 2 1/2 days with decrease in mobilty Also had a fracture of ankle and both shoulders    How long can you sit comfortably?  sitting is fine , pt has most difficutly with standing up she has to stand for a second and then it ok    How long can you stand comfortably?  not a problem once she is getting moving she is ok, just very slow to get moving    How long can you walk comfortably?  no problem    Diagnostic tests  no problem    Patient Stated Goals  to get rid of the pain    Currently in Pain?  Yes    Pain Score  2    earlier was 6/10   Pain Location  Hip    Pain Orientation  Right;Left    Pain Descriptors / Indicators  Dull;Sore    Pain Type  Acute pain    Pain Onset  More than a month ago    Pain Frequency  Intermittent    Aggravating Factors  end of the day worse after sitting a while    Pain Relieving Factors  better in the morning, occasional advil    Multiple Pain Sites  No                       [facility] Adult PT Treatment/Exercise - 03/05/19 0001      Lumbar Exercises: Stretches   ITB Stretch  Right;Left;1 rep;30 seconds   foam roll   Piriformis Stretch  Right;Left;1 rep;30 seconds   using foam roll     Lumbar Exercises: Supine   Heel Slides  20 reps    Heel Slides Limitations  using green theraband with lower abdominal contraction and squeeze gluteal and thigh when leg straing    Other Supine Lumbar Exercises  bridge with clam 10 x 2       Manual Therapy   Manual Therapy  Soft tissue mobilization    Soft tissue mobilization  using the addaday to the thoracic lumbar parapsinals, bilateral gluteal       Trigger Point Dry Needling - 03/05/19 0001    Consent Given?  Yes    Education Handout Provided  Previously provided    Muscles  Treated Back/Hip  Lumbar multifidi    Lumbar multifidi Response  Twitch response elicited;Palpable increased muscle length                PT Long Term Goals - 03/05/19 1101      PT LONG TERM GOAL #1   Title  Pt will report the pain and stiffness she feels when she stands after sitting for a while is decreased by 50%    Baseline  improved by a little bit    Time  4    Period  Weeks    Status  On-going      PT LONG TERM GOAL #2   Title  Pt wil be indepenent in a home exercise for stretching her low back and strengthening her core    Period  Weeks    Status  On-going      PT LONG TERM GOAL #3   Title  Pt will report that she no longer has difficulty climbing her stairs at night at least 6 out of 7 days a week.    Baseline  getting a little bit easier    Time  4    Period  Weeks    Status  On-going            Plan - 03/05/19 1051    Clinical Impression Statement  Patient was very sore from the dry needling to the gluteal and it lasted till today. Patient had minimal trigger points in the lumbar paraspinals. Patient has gluteal pain with going up steps and hills. Patient is working on her gluteal strength and core strength. Patient will benefit from skilled therapy to work on cores and hip strength while elongating muscles.    Personal Factors and Comorbidities  Comorbidity 3+    Comorbidities  hx of breast cancer with radiation, hx of melanoma, osteoporosis    Examination-Activity Limitations  Stairs    Stability/Clinical Decision Making  Stable/Uncomplicated    Rehab Potential  Excellent    PT Frequency  2x / week    PT Duration  6 weeks    PT Treatment/Interventions  ADLs/Self Care Home Management;Electrical Stimulation;Iontophoresis 4mg /ml Dexamethasone;Moist Heat;Therapeutic activities;Functional mobility training;Therapeutic exercise;Patient/family education;Dry needling;Manual techniques    PT Next Visit Plan  measure hip strength;  progresss hip and core  strength, work in standing too; work the gluteal    PT Home Exercise Plan  Access Code: [email or web address]    Consulted and Agree with Plan of Care  Patient       Patient will benefit from skilled therapeutic intervention in order to improve the following deficits and impairments:  Pain, Postural dysfunction, Increased fascial restricitons, Impaired perceived functional ability, Increased muscle spasms, Decreased endurance  Visit Diagnosis: Pain in right thigh  Pain in left thigh  Other symptoms and signs involving the musculoskeletal system  Muscle weakness (generalized)     Problem List Patient Active Problem List   Diagnosis Date Noted  . Genetic testing 03/05/2016  . Family history of breast cancer   . Family history of ovarian cancer   . Malignant neoplasm of upper-outer quadrant of right breast in female, estrogen receptor positive ([place]) 01/26/2016    Earlie Counts, PT 03/05/19 11:05 AM   [facility] [address] [place], [place], [address] Phone: [phone]   Fax:  [phone]  Name: Julie Mack MRN: [id number] Date of Birth: 14-Jun-1955

## 2019-03-07 ENCOUNTER — Other Ambulatory Visit: Payer: Self-pay

## 2019-03-07 ENCOUNTER — Ambulatory Visit: Payer: PRIVATE HEALTH INSURANCE | Attending: Oncology | Admitting: Physical Therapy

## 2019-03-07 ENCOUNTER — Encounter: Payer: Self-pay | Admitting: Physical Therapy

## 2019-03-07 DIAGNOSIS — M79651 Pain in right thigh: Secondary | ICD-10-CM | POA: Diagnosis present

## 2019-03-07 DIAGNOSIS — R29898 Other symptoms and signs involving the musculoskeletal system: Secondary | ICD-10-CM | POA: Diagnosis present

## 2019-03-07 DIAGNOSIS — M6281 Muscle weakness (generalized): Secondary | ICD-10-CM | POA: Insufficient documentation

## 2019-03-07 DIAGNOSIS — M79652 Pain in left thigh: Secondary | ICD-10-CM | POA: Diagnosis present

## 2019-03-07 NOTE — Therapy (Signed)
[facility] [address] [place], [place], [address] Phone: [phone]   Fax:  [phone]  Physical Therapy Treatment  Patient Details  Name: Julie Mack MRN: [id number] Date of Birth: 06-17-1955 Referring Provider (PT): Dr. Jana Hakim    Encounter Date: 03/07/2019  PT End of Session - 03/07/19 1022    Visit Number  6    Number of Visits  9    Date for PT Re-Evaluation  04/16/19    Authorization Type  Medcost    PT Start Time  1019    PT Stop Time  1100    PT Time Calculation (min)  41 min    Activity Tolerance  Patient tolerated treatment well    Behavior During Therapy  [facility] for tasks assessed/performed       Past Medical History:  Diagnosis Date  . Asthma    allergy induced asthma as child, none now  . Breast cancer ([place]) 01/2016   right breast cancer  . Family history of breast cancer   . Family history of ovarian cancer   . Headache   . History of radiation therapy 04/20/16- 05/17/16   Right Breast 40.05 Gy in 15 fractions and Right Breast boost 10 Gy in 5 fractions.   . Melanoma ([place])    in situ  . Osteoporosis   . Personal history of radiation therapy 2017    Past Surgical History:  Procedure Laterality Date  . ANKLE FRACTURE SURGERY Right 2015  . BREAST LUMPECTOMY Right 02/10/2016  . BREAST LUMPECTOMY WITH RADIOACTIVE SEED AND SENTINEL LYMPH NODE BIOPSY Right 02/10/2016   Procedure: RIGHT BREAST LUMPECTOMY WITH RADIOACTIVE SEED AND RIGHT SENTINEL LYMPH NODE BIOPSY WITH INJECT BLUE DYE;  Surgeon: Fanny Skates, MD;  Location: [facility];  Service: General;  Laterality: Right;  . DILATION AND CURETTAGE OF UTERUS    . [place]  . EYE SURGERY  1967   cyst removed  . TONSILLECTOMY  1966    There were no vitals filed for this visit.  Subjective Assessment - 03/07/19 1023    Subjective  I feel 30% better. Somedays I feel 80% better. I cannot pinpoint  why days are so different.    Pertinent History  right breast cancer in 2017 with lumpectomy and radiation followed.  Hx included melanoma from back of right shoulder , osetoporosis, ( had a one treatment of reclast and had a reaction with pain in multiple joints and sick for about 2 1/2 days with decrease in mobilty Also had a fracture of ankle and both shoulders    Currently in Pain?  No/denies    Multiple Pain Sites  No         [facility] PT Assessment - 03/07/19 0001      Strength   Left Hip ABduction  4-/5                   [facility] Adult PT Treatment/Exercise - 03/07/19 0001      Self-Care   Other Self-Care Comments   Importance of not sitting too long at her desk.  Educated pt in best practices for getting up more often. Pt verbally understood.       Lumbar Exercises: Stretches   Single Knee to Chest Stretch  Right;Left;3 reps;20 seconds    Single Knee to Chest Stretch Limitations  Knee to opposite shoulder 3x 20 sec holds    Pelvic Tilt  10 reps  Lumbar Exercises: Supine   Clam  20 reps    Clam Limitations  green band    Straight Leg Raise  --   6x bil, VC to maintain PPT   Other Supine Lumbar Exercises  bridge with clam 10 x 2    Review and performance                 PT Long Term Goals - 03/07/19 1026      PT LONG TERM GOAL #1   Title  Pt will report the pain and stiffness she feels when she stands after sitting for a while is decreased by 50%    Baseline  improved by a little bit    Time  4    Period  Weeks    Status  On-going            Plan - 03/07/19 1024    Clinical Impression Statement  Pt felt much better after her last session, all soreness has abolished from dry needling. She overall repots 30% improvement but on some days she can feel upwards of 80% better. Pt continues to show improvement with with core contraction and maintaining it as her extremities move.Lt hip abduction showing improved ability to contract by MMT  improvement to 4-/5. Discussed importance of not sitting too long at her desk. She will take some time to think about how to establish a new routine so she gets up more often,    Personal Factors and Comorbidities  Comorbidity 3+    Comorbidities  hx of breast cancer with radiation, hx of melanoma, osteoporosis    Examination-Activity Limitations  Stairs    Stability/Clinical Decision Making  Stable/Uncomplicated    Rehab Potential  Excellent    PT Frequency  2x / week    PT Duration  6 weeks    PT Treatment/Interventions  ADLs/Self Care Home Management;Electrical Stimulation;Iontophoresis 4mg /ml Dexamethasone;Moist Heat;Therapeutic activities;Functional mobility training;Therapeutic exercise;Patient/family education;Dry needling;Manual techniques    PT Next Visit Plan  Progresss hip and core strength, work in standing too; work the gluteal    PT Home Exercise Plan  Access Code: [email or web address]    Consulted and Agree with Plan of Care  Patient       Patient will benefit from skilled therapeutic intervention in order to improve the following deficits and impairments:  Pain, Postural dysfunction, Increased fascial restricitons, Impaired perceived functional ability, Increased muscle spasms, Decreased endurance  Visit Diagnosis: Pain in right thigh  Pain in left thigh  Other symptoms and signs involving the musculoskeletal system  Muscle weakness (generalized)     Problem List Patient Active Problem List   Diagnosis Date Noted  . Genetic testing 03/05/2016  . Family history of breast cancer   . Family history of ovarian cancer   . Malignant neoplasm of upper-outer quadrant of right breast in female, estrogen receptor positive ([place]) 01/26/2016    Tyller Bowlby, PTA 03/07/2019, 10:56 AM  [facility] [address] [place], [place], [address] Phone: [phone]   Fax:  [phone]  Name: Julie Mack MRN:  [id number] Date of Birth: 10/14/1955

## 2019-03-12 ENCOUNTER — Other Ambulatory Visit: Payer: Self-pay

## 2019-03-12 ENCOUNTER — Ambulatory Visit: Payer: PRIVATE HEALTH INSURANCE | Admitting: Physical Therapy

## 2019-03-12 ENCOUNTER — Encounter: Payer: Self-pay | Admitting: Physical Therapy

## 2019-03-12 DIAGNOSIS — M79651 Pain in right thigh: Secondary | ICD-10-CM

## 2019-03-12 DIAGNOSIS — M79652 Pain in left thigh: Secondary | ICD-10-CM

## 2019-03-12 DIAGNOSIS — M6281 Muscle weakness (generalized): Secondary | ICD-10-CM

## 2019-03-12 DIAGNOSIS — R29898 Other symptoms and signs involving the musculoskeletal system: Secondary | ICD-10-CM

## 2019-03-12 NOTE — Therapy (Signed)
[facility] [address] [place], [place], [address] Phone: [phone]   Fax:  [phone]  Physical Therapy Treatment  Patient Details  Name: Julie Mack MRN: [id number] Date of Birth: 1956-01-23 Referring Provider (PT): Dr. Jana Hakim    Encounter Date: 03/12/2019  PT End of Session - 03/12/19 1153    Visit Number  7    Number of Visits  9    Date for PT Re-Evaluation  04/16/19    PT Start Time  1149    PT Stop Time  1227    PT Time Calculation (min)  38 min    Activity Tolerance  Patient tolerated treatment well    Behavior During Therapy  [facility] for tasks assessed/performed       Past Medical History:  Diagnosis Date  . Asthma    allergy induced asthma as child, none now  . Breast cancer ([place]) 01/2016   right breast cancer  . Family history of breast cancer   . Family history of ovarian cancer   . Headache   . History of radiation therapy 04/20/16- 05/17/16   Right Breast 40.05 Gy in 15 fractions and Right Breast boost 10 Gy in 5 fractions.   . Melanoma ([place])    in situ  . Osteoporosis   . Personal history of radiation therapy 2017    Past Surgical History:  Procedure Laterality Date  . ANKLE FRACTURE SURGERY Right 2015  . BREAST LUMPECTOMY Right 02/10/2016  . BREAST LUMPECTOMY WITH RADIOACTIVE SEED AND SENTINEL LYMPH NODE BIOPSY Right 02/10/2016   Procedure: RIGHT BREAST LUMPECTOMY WITH RADIOACTIVE SEED AND RIGHT SENTINEL LYMPH NODE BIOPSY WITH INJECT BLUE DYE;  Surgeon: Julie Skates, MD;  Location: [place];  Service: General;  Laterality: Right;  . DILATION AND CURETTAGE OF UTERUS    . [place]  . EYE SURGERY  1967   cyst removed  . TONSILLECTOMY  1966    There were no vitals filed for this visit.  Subjective Assessment - 03/12/19 1153    Subjective  I tried not sitting as long and that really helped lessen my pain as I stood up.    Pertinent  History  right breast cancer in 2017 with lumpectomy and radiation followed.  Hx included melanoma from back of right shoulder , osetoporosis, ( had a one treatment of reclast and had a reaction with pain in multiple joints and sick for about 2 1/2 days with decrease in mobilty Also had a fracture of ankle and both shoulders    Currently in Pain?  No/denies    Multiple Pain Sites  No                       [facility] Adult PT Treatment/Exercise - 03/12/19 0001      Lumbar Exercises: Stretches   Single Knee to Chest Stretch  Right;Left;3 reps;20 seconds    Single Knee to Chest Stretch Limitations  Knee to opposite shoulder 2x 20 sec holds    Pelvic Tilt  10 reps    Figure 4 Stretch  2 reps;20 seconds      Lumbar Exercises: Aerobic   Nustep  L3 x 6 min to end session       Lumbar Exercises: Supine   Clam  20 reps   2 sets   Clam Limitations  green band    Bridge  10 reps;3 seconds    Bridge with clamshell  10 reps    Straight Leg Raise  10 reps      Lumbar Exercises: Sidelying   Hip Abduction  Right;Left;10 reps      Lumbar Exercises: Quadruped   Other Quadruped Lumbar Exercises  Donkey kick bil 10x: VC for thoracic extension , TC for level pelvis             PT Education - 03/12/19 1617    Education Details  Online yoga streaming classes per pt request: [employer] and [employer]) Educated  Patient    Methods  Explanation    Comprehension  Verbalized understanding          PT Long Term Goals - 03/07/19 1026      PT LONG TERM GOAL #1   Title  Pt will report the pain and stiffness she feels when she stands after sitting for a while is decreased by 50%    Baseline  improved by a little bit    Time  4    Period  Weeks    Status  On-going            Plan - 03/12/19 1159    Clinical Impression Statement  Pt reports by changing her habit at work and at home to not sitting for too long before getting up, she found her pain  upon standing was less/more tolerable. Pt continues her compliance with all her HEP and feels like "my core and buttocks" are getting stronger.    Personal Factors and Comorbidities  Comorbidity 3+    Comorbidities  hx of breast cancer with radiation, hx of melanoma, osteoporosis    Examination-Activity Limitations  Stairs    Stability/Clinical Decision Making  Stable/Uncomplicated    Rehab Potential  Excellent    PT Frequency  2x / week    PT Duration  6 weeks    PT Treatment/Interventions  ADLs/Self Care Home Management;Electrical Stimulation;Iontophoresis 4mg /ml Dexamethasone;Moist Heat;Therapeutic activities;Functional mobility training;Therapeutic exercise;Patient/family education;Dry needling;Manual techniques    PT Next Visit Plan  Progresss hip and core strength, work in standing too; work the gluteal, review goals. See if pt checked out yoga streaming classes.    PT Home Exercise Plan  Access Code: [email or web address]    Consulted and Agree with Plan of Care  Patient       Patient will benefit from skilled therapeutic intervention in order to improve the following deficits and impairments:  Pain, Postural dysfunction, Increased fascial restricitons, Impaired perceived functional ability, Increased muscle spasms, Decreased endurance  Visit Diagnosis: Pain in right thigh  Pain in left thigh  Other symptoms and signs involving the musculoskeletal system  Muscle weakness (generalized)     Problem List Patient Active Problem List   Diagnosis Date Noted  . Genetic testing 03/05/2016  . Family history of breast cancer   . Family history of ovarian cancer   . Malignant neoplasm of upper-outer quadrant of right breast in female, estrogen receptor positive ([place]) 01/26/2016    Julie Mack, PTA 03/12/2019, 4:18 PM  [facility] [address] [place], [place], [address] Phone: [phone]   Fax:  [phone]  Name: Julie Mack MRN: [id number] Date of Birth: 03-19-1956

## 2019-03-14 ENCOUNTER — Encounter: Payer: Self-pay | Admitting: Physical Therapy

## 2019-03-14 ENCOUNTER — Ambulatory Visit: Payer: PRIVATE HEALTH INSURANCE | Admitting: Physical Therapy

## 2019-03-14 ENCOUNTER — Other Ambulatory Visit: Payer: Self-pay

## 2019-03-14 DIAGNOSIS — M79652 Pain in left thigh: Secondary | ICD-10-CM

## 2019-03-14 DIAGNOSIS — M6281 Muscle weakness (generalized): Secondary | ICD-10-CM

## 2019-03-14 DIAGNOSIS — M79651 Pain in right thigh: Secondary | ICD-10-CM | POA: Diagnosis not present

## 2019-03-14 DIAGNOSIS — R29898 Other symptoms and signs involving the musculoskeletal system: Secondary | ICD-10-CM

## 2019-03-14 NOTE — Therapy (Signed)
[facility] [address] [place], [place], [address] Phone: [phone]   Fax:  [phone]  Physical Therapy Treatment  Patient Details  Name: Julie Mack MRN: [id number] Date of Birth: 1956/03/21 Referring Provider (PT): Dr. Jana Hakim    Encounter Date: 03/14/2019  PT End of Session - 03/14/19 1111    Visit Number  8    Date for PT Re-Evaluation  04/16/19    Authorization Type  Medcost    PT Start Time  1100    PT Stop Time  1140    PT Time Calculation (min)  40 min    Activity Tolerance  Patient tolerated treatment well    Behavior During Therapy  [facility] for tasks assessed/performed       Past Medical History:  Diagnosis Date  . Asthma    allergy induced asthma as child, none now  . Breast cancer ([place]) 01/2016   right breast cancer  . Family history of breast cancer   . Family history of ovarian cancer   . Headache   . History of radiation therapy 04/20/16- 05/17/16   Right Breast 40.05 Gy in 15 fractions and Right Breast boost 10 Gy in 5 fractions.   . Melanoma ([place])    in situ  . Osteoporosis   . Personal history of radiation therapy 2017    Past Surgical History:  Procedure Laterality Date  . ANKLE FRACTURE SURGERY Right 2015  . BREAST LUMPECTOMY Right 02/10/2016  . BREAST LUMPECTOMY WITH RADIOACTIVE SEED AND SENTINEL LYMPH NODE BIOPSY Right 02/10/2016   Procedure: RIGHT BREAST LUMPECTOMY WITH RADIOACTIVE SEED AND RIGHT SENTINEL LYMPH NODE BIOPSY WITH INJECT BLUE DYE;  Surgeon: Fanny Skates, MD;  Location: [facility];  Service: General;  Laterality: Right;  . DILATION AND CURETTAGE OF UTERUS    . [place]  . EYE SURGERY  1967   cyst removed  . TONSILLECTOMY  1966    There were no vitals filed for this visit.  Subjective Assessment - 03/14/19 1106    Subjective  I walked 5-6 miles and had some discomfort in my back and used heat. The heat helped.  Hardest part is going up stairs due to left hurts every step. My left hip is super stiff and hurs. Last time I had a bone scan it was osteopenia.    Pertinent History  right breast cancer in 2017 with lumpectomy and radiation followed.  Hx included melanoma from back of right shoulder , osetoporosis, ( had a one treatment of reclast and had a reaction with pain in multiple joints and sick for about 2 1/2 days with decrease in mobilty Also had a fracture of ankle and both shoulders    Patient Stated Goals  to get rid of the pain    Currently in Pain?  Yes    Pain Score  5    bad day os 9/10   Pain Location  Hip    Pain Orientation  Left    Pain Descriptors / Indicators  Tightness    Pain Type  Acute pain    Pain Onset  More than a month ago    Pain Frequency  Intermittent    Aggravating Factors   stairs, walking up hills    Pain Relieving Factors  no stairs, take advil and heat                       [facility]  Adult PT Treatment/Exercise - 03/14/19 0001      Lumbar Exercises: Aerobic   Recumbent Bike  recumbent bike 4 for 6 mintues while assessing patient      Lumbar Exercises: Standing   Other Standing Lumbar Exercises  going up and down steps to monitor symptoms after therapist does manual tasks,     Other Standing Lumbar Exercises  step up on 6 inch step with left with therapist giving tactile cues to contract gluteals for full extension and not rotate left hip      Lumbar Exercises: Supine   Single Leg Bridge  10 reps;1 second   2 sets   Bridge with [employer] Limitations  on left leg      Lumbar Exercises: Quadruped   Straight Leg Raise  10 reps;1 second    Straight Leg Raises Limitations  tactile cues to not lift left hip and rotated out to the side, 10x wiht knee flexed      Manual Therapy   Manual Therapy  Joint mobilization    Manual therapy comments  supine pull left leg up with stretching the left hamstring and stretch the nerve in left leg    Joint  Mobilization  using the mulligan belt to distract and distrac with posterior glide with weightshift to the left foot on a higher step    Soft tissue mobilization  using the iastim device working on the left gluteal , around the ischial tuberosity, and medical aspect of the left ischial tuberosity                  PT Long Term Goals - 03/14/19 1153      PT LONG TERM GOAL #1   Title  Pt will report the pain and stiffness she feels when she stands after sitting for a while is decreased by 50%    Baseline  improved by a little bit; feels it in left hip with stairs    Time  4    Period  Weeks    Status  On-going      PT LONG TERM GOAL #2   Title  Pt wil be indepenent in a home exercise for stretching her low back and strengthening her core    Period  Weeks    Status  On-going      PT LONG TERM GOAL #3   Title  Pt will report that she no longer has difficulty climbing her stairs at night at least 6 out of 7 days a week.    Baseline  getting a little bit easier, hardest is going up stairs and hills in left hip    Time  4    Period  Weeks    Status  On-going            Plan - 03/14/19 1137    Clinical Impression Statement  Patient was having the most pain in her left hip with going up steps and hills. After the manual work and strengthen the left gluteals the pain decreased significantly. Patient was having touble with contracting the left gluteal and had tightness in the anterior left hip. When patient would extend her left hip the leg would go out to the side and fully extend. When patient would go upstairs her left hip would not fully extend. Patient had some fascial restrictions in the posterior left hip. Patient would benefit from skilled therapy to improve left gluteal strength and working on decreasing rotation of left hip with extension.  Comorbidities  hx of breast cancer with radiation, hx of melanoma, osteoporosis    Examination-Activity Limitations  Stairs     Stability/Clinical Decision Making  Stable/Uncomplicated    Rehab Potential  Excellent    PT Frequency  2x / week    PT Duration  6 weeks    PT Treatment/Interventions  ADLs/Self Care Home Management;Electrical Stimulation;Iontophoresis 4mg /ml Dexamethasone;Moist Heat;Therapeutic activities;Functional mobility training;Therapeutic exercise;Patient/family education;Dry needling;Manual techniques    PT Next Visit Plan  Progresss hip and core strength, work in standing too; work the gluteal, review goals. See if pt checked out yoga streaming classes.    PT Home Exercise Plan  Access Code: [email or web address]    Consulted and Agree with Plan of Care  Patient       Patient will benefit from skilled therapeutic intervention in order to improve the following deficits and impairments:  Pain, Postural dysfunction, Increased fascial restricitons, Impaired perceived functional ability, Increased muscle spasms, Decreased endurance  Visit Diagnosis: Pain in right thigh  Pain in left thigh  Other symptoms and signs involving the musculoskeletal system  Muscle weakness (generalized)     Problem List Patient Active Problem List   Diagnosis Date Noted  . Genetic testing 03/05/2016  . Family history of breast cancer   . Family history of ovarian cancer   . Malignant neoplasm of upper-outer quadrant of right breast in female, estrogen receptor positive ([place]) 01/26/2016    Earlie Counts, PT 03/14/19 11:58 AM   [facility] [address] [place], [place], [address] Phone: [phone]   Fax:  [phone]  Name: Julie Mack MRN: [id number] Date of Birth: 07/29/1955

## 2019-03-21 ENCOUNTER — Encounter: Payer: Self-pay | Admitting: Physical Therapy

## 2019-03-21 ENCOUNTER — Ambulatory Visit: Payer: PRIVATE HEALTH INSURANCE | Admitting: Physical Therapy

## 2019-03-21 ENCOUNTER — Other Ambulatory Visit: Payer: Self-pay

## 2019-03-21 DIAGNOSIS — M6281 Muscle weakness (generalized): Secondary | ICD-10-CM

## 2019-03-21 DIAGNOSIS — M79651 Pain in right thigh: Secondary | ICD-10-CM | POA: Diagnosis not present

## 2019-03-21 DIAGNOSIS — R29898 Other symptoms and signs involving the musculoskeletal system: Secondary | ICD-10-CM

## 2019-03-21 DIAGNOSIS — M79652 Pain in left thigh: Secondary | ICD-10-CM

## 2019-03-21 NOTE — Therapy (Signed)
[facility] [address] [place], [place], [address] Phone: [phone]   Fax:  [phone]  Physical Therapy Treatment  Patient Details  Name: Julie Mack MRN: [id number] Date of Birth: 1955/07/29 Referring Provider (PT): Dr. Jana Hakim    Encounter Date: 03/21/2019  PT End of Session - 03/21/19 1014    Visit Number  9    Number of Visits  9    Date for PT Re-Evaluation  04/16/19    Authorization Type  Medcost    PT Start Time  1013    PT Stop Time  1107    PT Time Calculation (min)  54 min    Activity Tolerance  Patient tolerated treatment well    Behavior During Therapy  [facility] for tasks assessed/performed       Past Medical History:  Diagnosis Date  . Asthma    allergy induced asthma as child, none now  . Breast cancer ([place]) 01/2016   right breast cancer  . Family history of breast cancer   . Family history of ovarian cancer   . Headache   . History of radiation therapy 04/20/16- 05/17/16   Right Breast 40.05 Gy in 15 fractions and Right Breast boost 10 Gy in 5 fractions.   . Melanoma ([place])    in situ  . Osteoporosis   . Personal history of radiation therapy 2017    Past Surgical History:  Procedure Laterality Date  . ANKLE FRACTURE SURGERY Right 2015  . BREAST LUMPECTOMY Right 02/10/2016  . BREAST LUMPECTOMY WITH RADIOACTIVE SEED AND SENTINEL LYMPH NODE BIOPSY Right 02/10/2016   Procedure: RIGHT BREAST LUMPECTOMY WITH RADIOACTIVE SEED AND RIGHT SENTINEL LYMPH NODE BIOPSY WITH INJECT BLUE DYE;  Surgeon: Fanny Skates, MD;  Location: [facility];  Service: General;  Laterality: Right;  . DILATION AND CURETTAGE OF UTERUS    . [place]  . EYE SURGERY  1967   cyst removed  . TONSILLECTOMY  1966    There were no vitals filed for this visit.  Subjective Assessment - 03/21/19 1015    Subjective  My hip is much better since last session. i have had a really  good week.    Pertinent History  right breast cancer in 2017 with lumpectomy and radiation followed.  Hx included melanoma from back of right shoulder , osetoporosis, ( had a one treatment of reclast and had a reaction with pain in multiple joints and sick for about 2 1/2 days with decrease in mobilty Also had a fracture of ankle and both shoulders    How long can you stand comfortably?  not a problem once she is getting moving she is ok, just very slow to get moving    Currently in Pain?  No/denies    Multiple Pain Sites  No                       [facility] Adult PT Treatment/Exercise - 03/21/19 0001      Lumbar Exercises: Stretches   Active Hamstring Stretch  Right;Left;2 reps;30 seconds    Other Lumbar Stretch Exercise  Hand to Big Toe yoga stretch for Lt hip with strap 10 sec 2x, VC to stay in range that she can tolerate    Other Lumbar Stretch Exercise  Bil hip IR/ER static stretches Lt > RT 30 sec 3x       Lumbar Exercises: Aerobic   Nustep  L3 x  6 min to end session       Lumbar Exercises: Supine   Bridge  --   on red ball with LE straight 10x, then add knee flexion 10x.   Bridge Limitations  VC to not rotate the LTLE laterally    Other Supine Lumbar Exercises  Hip IR strength ball squeeze and yellow  band at ankle 2x10      Lumbar Exercises: Sidelying   Clam  Left;20 reps             PT Education - 03/21/19 1106    Education Details  Traction Twist stretch for hip IR/ER and Yoga hand to Big Toe Stretch with the strap mainly for lateral LT hip.    Person(s) Educated  Patient    Methods  Explanation;Demonstration;Tactile cues;Verbal cues;Handout    Comprehension  Returned demonstration;Verbalized understanding          PT Long Term Goals - 03/14/19 1153      PT LONG TERM GOAL #1   Title  Pt will report the pain and stiffness she feels when she stands after sitting for a while is decreased by 50%    Baseline  improved by a little bit; feels it in left  hip with stairs    Time  4    Period  Weeks    Status  On-going      PT LONG TERM GOAL #2   Title  Pt wil be indepenent in a home exercise for stretching her low back and strengthening her core    Period  Weeks    Status  On-going      PT LONG TERM GOAL #3   Title  Pt will report that she no longer has difficulty climbing her stairs at night at least 6 out of 7 days a week.    Baseline  getting a little bit easier, hardest is going up stairs and hills in left hip    Time  4    Period  Weeks    Status  On-going            Plan - 03/21/19 1016    Clinical Impression Statement  Pt arrives pain free today and reports her lt hip has felt much better since last session. She only "feels it a littel" on the stairs. Todays session focused on LT hip AROM and flexibility for IR and some ER and then strengthening the same rotators. Added some new stretches to HEP.    Personal Factors and Comorbidities  Comorbidity 3+    Comorbidities  hx of breast cancer with radiation, hx of melanoma, osteoporosis    Examination-Activity Limitations  Stairs    Stability/Clinical Decision Making  Stable/Uncomplicated    Rehab Potential  Excellent    PT Frequency  2x / week    PT Duration  6 weeks    PT Treatment/Interventions  ADLs/Self Care Home Management;Electrical Stimulation;Iontophoresis 4mg /ml Dexamethasone;Moist Heat;Therapeutic activities;Functional mobility training;Therapeutic exercise;Patient/family education;Dry needling;Manual techniques    PT Next Visit Plan  Progresss hip and core strength, review the new stretches given today.    PT Home Exercise Plan  Access Code: [email or web address]    Consulted and Agree with Plan of Care  Patient       Patient will benefit from skilled therapeutic intervention in order to improve the following deficits and impairments:  Pain, Postural dysfunction, Increased fascial restricitons, Impaired perceived functional ability, Increased muscle spasms, Decreased  endurance  Visit Diagnosis: Pain in right thigh  Pain  in left thigh  Other symptoms and signs involving the musculoskeletal system  Muscle weakness (generalized)     Problem List Patient Active Problem List   Diagnosis Date Noted  . Genetic testing 03/05/2016  . Family history of breast cancer   . Family history of ovarian cancer   . Malignant neoplasm of upper-outer quadrant of right breast in female, estrogen receptor positive ([place]) 01/26/2016    Shirlean Berman, PTA 03/21/2019, 11:10 AM  [facility] [address], [place], [place], [address] Phone: [phone]   Fax:  [phone]  Name: Julie Mack MRN: [id number] Date of Birth: 10/23/1955  Access Code: [email or web address]  URL: [email or web address]  Date: 03/21/2019  Prepared by: Myrene Galas   Exercises  Supine Piriformis Stretch Pulling Heel to Hip - 2 reps - 1 sets - 30 sec hold - 1x daily - 7x weekly  Supine Hamstring Stretch - 2 reps - 1 sets - 30 sec hold - 1x daily - 7x weekly  Bridge with Hip Abduction and Resistance - 10 reps - 1 sets - 1x daily - 7x weekly  Quadruped Bent Leg Hip Extension - 10 reps - 1 sets - 1x daily - 7x weekly  Sidelying Diagonal Hip Abduction - 10 reps - 2 sets - 1x daily - 7x weekly  Single Leg Bridge - 10 reps - 1 sets - 1x daily - 7x weekly  Quadruped Leg Lifts - 10 reps - 1 sets - 1x daily - 7x weekly  Supine Bilateral Hip Internal Rotation Stretch - 3 reps - 2 sets - 20 hold - 2x daily - 7x weekly  Supine Hamstring Stretch with Strap - 3 reps - 1 sets - 15 hold - 2x daily - 7x weekly  Patient Education  Trigger Point Dry Needling  Lumbar Spondylolisthesis

## 2019-03-23 ENCOUNTER — Other Ambulatory Visit: Payer: Self-pay

## 2019-03-23 ENCOUNTER — Ambulatory Visit: Payer: PRIVATE HEALTH INSURANCE | Admitting: Physical Therapy

## 2019-03-23 ENCOUNTER — Encounter: Payer: Self-pay | Admitting: Physical Therapy

## 2019-03-23 DIAGNOSIS — M6281 Muscle weakness (generalized): Secondary | ICD-10-CM

## 2019-03-23 DIAGNOSIS — M79652 Pain in left thigh: Secondary | ICD-10-CM

## 2019-03-23 DIAGNOSIS — M79651 Pain in right thigh: Secondary | ICD-10-CM | POA: Diagnosis not present

## 2019-03-23 DIAGNOSIS — R29898 Other symptoms and signs involving the musculoskeletal system: Secondary | ICD-10-CM

## 2019-03-23 NOTE — Therapy (Signed)
[facility] [address], [place], [place], [address] Phone: [phone]   Fax:  [phone]  Physical Therapy Treatment  Patient Details  Name: Julie Mack MRN: [id number] Date of Birth: 13-Aug-1955 Referring Provider (PT): Dr. Jana Hakim    Encounter Date: 03/23/2019  PT End of Session - 03/23/19 1052    Visit Number  10    Date for PT Re-Evaluation  04/16/19    Authorization Type  Medcost    PT Start Time  1015    PT Stop Time  1055    PT Time Calculation (min)  40 min    Activity Tolerance  Patient tolerated treatment well    Behavior During Therapy  [facility] for tasks assessed/performed       Past Medical History:  Diagnosis Date  . Asthma    allergy induced asthma as child, none now  . Breast cancer ([place]) 01/2016   right breast cancer  . Family history of breast cancer   . Family history of ovarian cancer   . Headache   . History of radiation therapy 04/20/16- 05/17/16   Right Breast 40.05 Gy in 15 fractions and Right Breast boost 10 Gy in 5 fractions.   . Melanoma ([place])    in situ  . Osteoporosis   . Personal history of radiation therapy 2017    Past Surgical History:  Procedure Laterality Date  . ANKLE FRACTURE SURGERY Right 2015  . BREAST LUMPECTOMY Right 02/10/2016  . BREAST LUMPECTOMY WITH RADIOACTIVE SEED AND SENTINEL LYMPH NODE BIOPSY Right 02/10/2016   Procedure: RIGHT BREAST LUMPECTOMY WITH RADIOACTIVE SEED AND RIGHT SENTINEL LYMPH NODE BIOPSY WITH INJECT BLUE DYE;  Surgeon: Fanny Skates, MD;  Location: [facility];  Service: General;  Laterality: Right;  . DILATION AND CURETTAGE OF UTERUS    . [place]  . EYE SURGERY  1967   cyst removed  . TONSILLECTOMY  1966    There were no vitals filed for this visit.  Subjective Assessment - 03/23/19 1021    Subjective  I noticed yesterday I am moving better. I am having less pain and stiffness. Wednesday  stretches helped alot. I had heat at the end and that helped. When stand up from sitting I am a little stiff.    Pertinent History  right breast cancer in 2017 with lumpectomy and radiation followed.  Hx included melanoma from back of right shoulder , osetoporosis, ( had a one treatment of reclast and had a reaction with pain in multiple joints and sick for about 2 1/2 days with decrease in mobilty Also had a fracture of ankle and both shoulders    How long can you sit comfortably?  sitting is fine , pt has most difficutly with standing up she has to stand for a second and then it ok    How long can you stand comfortably?  not a problem once she is getting moving she is ok, just very slow to get moving    Diagnostic tests  no problem    Patient Stated Goals  to get rid of the pain    Currently in Pain?  No/denies                       [facility] Adult PT Treatment/Exercise - 03/23/19 0001      Lumbar Exercises: Stretches   Active Hamstring Stretch  Right;Left;2 reps;30 seconds    Piriformis Stretch  Right;Left;2 reps;30 seconds    Piriformis Stretch Limitations  with strap    Other Lumbar Stretch Exercise  Hand to Big Toe yoga stretch for Lt hip with strap 10 sec 2x, VC to stay in range that she can tolerate    Other Lumbar Stretch Exercise  Bil hip IR/ER static stretches Lt > RT 30 sec 3x       Lumbar Exercises: Aerobic   Nustep  L4 x 6 min while assessing patient      Manual Therapy   Manual Therapy  Myofascial release;Soft tissue mobilization    Soft tissue mobilization  elongation of the lumbar and low thoracic paraspinals, and left gluteal    Myofascial Release  using the cup to pull the fascial up from the muscle to elongate       Trigger Point Dry Needling - 03/23/19 0001    Consent Given?  Yes    Education Handout Provided  Previously provided    Muscles Treated Back/Hip  Lumbar multifidi;Gluteus medius    Gluteus Medius Response  Twitch response elicited;Palpable  increased muscle length   left   Lumbar multifidi Response  Twitch response elicited;Palpable increased muscle length                PT Long Term Goals - 03/23/19 1023      PT LONG TERM GOAL #1   Title  Pt will report the pain and stiffness she feels when she stands after sitting for a while is decreased by 50%    Time  4    Period  Weeks    Status  Achieved      PT LONG TERM GOAL #2   Title  Pt wil be indepenent in a home exercise for stretching her low back and strengthening her core    Period  Weeks    Status  On-going      PT LONG TERM GOAL #3   Title  Pt will report that she no longer has difficulty climbing her stairs at night at least 6 out of 7 days a week.    Baseline  4 out of 7 days of the week    Time  4    Period  Weeks    Status  On-going            Plan - 03/23/19 1052    Clinical Impression Statement  Patient is able to go up and down stairs without pain 4 out of 7 days. Patient is able to walk with less soreness and able to walk for 5 miles. Patient feels stronger. Patient is able to sleep at night with walking up at night fewer times. Patient is able to sleep on left hip without increased of pain. Patient will benefit from skilled therpay to improve strength and flexibility.    Personal Factors and Comorbidities  Comorbidity 3+    Comorbidities  hx of breast cancer with radiation, hx of melanoma, osteoporosis    Examination-Activity Limitations  Stairs    Stability/Clinical Decision Making  Stable/Uncomplicated    Rehab Potential  Excellent    PT Frequency  2x / week    PT Duration  6 weeks    PT Treatment/Interventions  ADLs/Self Care Home Management;Electrical Stimulation;Iontophoresis 4mg /ml Dexamethasone;Moist Heat;Therapeutic activities;Functional mobility training;Therapeutic exercise;Patient/family education;Dry needling;Manual techniques    PT Next Visit Plan  Progresss hip and core strength, see patient in 2 weeks to resum therapy    PT  Home Exercise Plan  Access Code: [email or web address]  Consulted and Agree with Plan of Care  Patient       Patient will benefit from skilled therapeutic intervention in order to improve the following deficits and impairments:  Pain, Postural dysfunction, Increased fascial restricitons, Impaired perceived functional ability, Increased muscle spasms, Decreased endurance  Visit Diagnosis: Pain in left thigh  Pain in right thigh  Other symptoms and signs involving the musculoskeletal system  Muscle weakness (generalized)     Problem List Patient Active Problem List   Diagnosis Date Noted  . Genetic testing 03/05/2016  . Family history of breast cancer   . Family history of ovarian cancer   . Malignant neoplasm of upper-outer quadrant of right breast in female, estrogen receptor positive ([place]) 01/26/2016    Earlie Counts, PT 03/23/19 11:01 AM   [facility] [address], [place], [place], [address] Phone: [phone]   Fax:  [phone]  Name: Julie Mack MRN: [id number] Date of Birth: Mar 05, 1956

## 2019-04-12 ENCOUNTER — Other Ambulatory Visit: Payer: Self-pay

## 2019-04-12 ENCOUNTER — Ambulatory Visit: Payer: PRIVATE HEALTH INSURANCE | Attending: Oncology | Admitting: Physical Therapy

## 2019-04-12 ENCOUNTER — Encounter: Payer: Self-pay | Admitting: Physical Therapy

## 2019-04-12 DIAGNOSIS — R29898 Other symptoms and signs involving the musculoskeletal system: Secondary | ICD-10-CM | POA: Diagnosis present

## 2019-04-12 DIAGNOSIS — M79652 Pain in left thigh: Secondary | ICD-10-CM

## 2019-04-12 DIAGNOSIS — M79651 Pain in right thigh: Secondary | ICD-10-CM

## 2019-04-12 DIAGNOSIS — M6281 Muscle weakness (generalized): Secondary | ICD-10-CM | POA: Insufficient documentation

## 2019-04-12 NOTE — Therapy (Signed)
[facility] [address], [place], [place], [address] Phone: [phone]   Fax:  [phone]  Physical Therapy Treatment  Patient Details  Name: Julie Mack MRN: [id number] Date of Birth: 1955/06/17 Referring Provider (PT): Dr. Jana Hakim    Encounter Date: 04/12/2019  PT End of Session - 04/12/19 1049    Visit Number  11    Date for PT Re-Evaluation  04/16/19    Authorization Type  Medcost    PT Start Time  1015    PT Stop Time  1046    PT Time Calculation (min)  31 min    Activity Tolerance  Patient tolerated treatment well    Behavior During Therapy  [facility] for tasks assessed/performed       Past Medical History:  Diagnosis Date  . Asthma    allergy induced asthma as child, none now  . Breast cancer ([place]) 01/2016   right breast cancer  . Family history of breast cancer   . Family history of ovarian cancer   . Headache   . History of radiation therapy 04/20/16- 05/17/16   Right Breast 40.05 Gy in 15 fractions and Right Breast boost 10 Gy in 5 fractions.   . Melanoma ([place])    in situ  . Osteoporosis   . Personal history of radiation therapy 2017    Past Surgical History:  Procedure Laterality Date  . ANKLE FRACTURE SURGERY Right 2015  . BREAST LUMPECTOMY Right 02/10/2016  . BREAST LUMPECTOMY WITH RADIOACTIVE SEED AND SENTINEL LYMPH NODE BIOPSY Right 02/10/2016   Procedure: RIGHT BREAST LUMPECTOMY WITH RADIOACTIVE SEED AND RIGHT SENTINEL LYMPH NODE BIOPSY WITH INJECT BLUE DYE;  Surgeon: Fanny Skates, MD;  Location: [facility];  Service: General;  Laterality: Right;  . DILATION AND CURETTAGE OF UTERUS    . [place]  . EYE SURGERY  1967   cyst removed  . TONSILLECTOMY  1966    There were no vitals filed for this visit.      [facility] PT Assessment - 04/12/19 0001      Assessment   Medical Diagnosis  s/p right breast cancer     Referring Provider (PT)  Dr.  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       Cognition   Overall Cognitive Status  Within Functional Limits for tasks assessed      Posture/Postural Control   Posture/Postural Control  No significant limitations      ROM / Strength   AROM / PROM / Strength  AROM;PROM;Strength      AROM   Lumbar Flexion  full    Lumbar Extension  full    Lumbar - Right Side Bend  full    Lumbar - Left Side Bend  full    Lumbar - Right Rotation  full    Lumbar - Left Rotation  full  Strength   Left Hip Extension  5/5    Left Hip External Rotation  5/5    Left Hip Internal Rotation  5/5    Left Hip ABduction  4+/5                   [facility] Adult PT Treatment/Exercise - 04/12/19 0001      Exercises   Exercises  Other Exercises    Other Exercises   verbally reviewed exercises on the [facility] website and patient is performing them at howm without questions      Lumbar Exercises: Stretches   Other Lumbar Stretch Exercise  manually stretched left hip adductor, hip rotators, hip extension, hip flexor, and hamstring             PT Education - 04/12/19 1047    Education Details  Access Code: [email or web address]    Person(s) Educated  Patient    Methods  Explanation    Comprehension  Verbalized understanding          PT Long Term Goals - 04/12/19 1019      PT LONG TERM GOAL #1   Title  Pt will report the pain and stiffness she feels when she stands after sitting for a while is decreased by 50%    Time  4    Period  Weeks    Status  Achieved      PT LONG TERM GOAL #2   Title  Pt  wil be indepenent in a home exercise for stretching her low back and strengthening her core    Period  Weeks    Status  Achieved      PT LONG TERM GOAL #3   Title  Pt will report that she no longer has difficulty climbing her stairs at night at least 6 out of 7 days a week.    Baseline  4 out of 7 days of the week    Time  4    Period  Weeks    Status  Achieved            Plan - 04/12/19 1030    Clinical Impression Statement  Patient has met all of her goals. Patient is able to go up and down steps without difficulty. Patient will have some stiffness in the left hip after sitting for a long car ride. Patient understands and is independent with her current HEP. Patient has full lumbar and left hip ROM. Patient has increased strength of left hip.    Personal Factors and Comorbidities  Comorbidity 3+    Comorbidities  hx of breast cancer with radiation, hx of melanoma, osteoporosis    Examination-Activity Limitations  Stairs    Stability/Clinical Decision Making  Stable/Uncomplicated    Rehab Potential  Excellent    PT Treatment/Interventions  ADLs/Self Care Home Management;Electrical Stimulation;Iontophoresis 22m/ml Dexamethasone;Moist Heat;Therapeutic activities;Functional mobility training;Therapeutic exercise;Patient/family education;Dry needling;Manual techniques    PT Next Visit Plan  Discharge to HEP this visit    PT Home Exercise Plan  Access Code: [email or web address]    Consulted and Agree with Plan of Care  Patient       Patient will benefit from skilled therapeutic intervention in order to improve the following deficits and impairments:  Pain, Postural dysfunction, Increased fascial restricitons, Impaired perceived functional ability, Increased muscle spasms, Decreased endurance  Visit Diagnosis: Pain in left thigh  Pain in right thigh  Other symptoms and signs involving the musculoskeletal system  Muscle weakness (generalized)  Problem List Patient Active Problem List    Diagnosis Date Noted  . Genetic testing 03/05/2016  . Family history of breast cancer   . Family history of ovarian cancer   . Malignant neoplasm of upper-outer quadrant of right breast in female, estrogen receptor positive ([place]) 01/26/2016    Earlie Counts, PT 04/12/19 10:51 AM   [facility] [address] [place], [place], [address] Phone: [phone]   Fax:  [phone]  Name: ZLATA ALCAIDE MRN: [id number] Date of Birth: January 21, 1956  PHYSICAL THERAPY DISCHARGE SUMMARY  Visits from Start of Care: 11  Current functional level related to goals / functional outcomes: See above.    Remaining deficits: See above.    Education / Equipment: HEP Plan: Patient agrees to discharge.  Patient goals were met. Patient is being discharged due to meeting the stated rehab goals.  Thank you for the referral. Earlie Counts, PT 04/12/19 10:52 AM  ?????

## 2019-04-12 NOTE — Patient Instructions (Signed)
Access Code: [email or web address]  URL: [email or web address]  Date: 03/21/2019  Prepared by: Earlie Counts   Exercises Supine Piriformis Stretch Pulling Heel to Hip - 2 reps - 1 sets - 30 sec hold - 1x daily - 7x weekly Supine Hamstring Stretch - 2 reps - 1 sets - 30 sec hold - 1x daily - 7x weekly Bridge with Hip Abduction and Resistance - 10 reps - 1 sets - hold - 1x daily - 7x weekly Quadruped Bent Leg Hip Extension - 10 reps - 1 sets - hold - 1x daily - 7x weekly Sidelying Diagonal Hip Abduction - 10 reps - 2 sets - hold - 1x daily - 7x weekly Single Leg Bridge - 10 reps - 1 sets - hold - 1x daily - 7x weekly Quadruped Leg Lifts - 10 reps - 1 sets - hold - 1x daily - 7x weekly Supine Bilateral Hip Internal Rotation Stretch - 3 reps - 2 sets - 20 hold - 2x daily - 7x weekly Supine Hamstring Stretch with Strap - 3 reps - 1 sets - 15 hold - 2x daily - 7x weekly Patient Education Trigger Point Dry Needling Lumbar Spondylolisthesis [facility] [address], [place], [place] [address] Phone # [phone] Fax [phone]

## 2019-04-16 ENCOUNTER — Ambulatory Visit: Payer: PRIVATE HEALTH INSURANCE | Admitting: Physical Therapy

## 2019-07-05 ENCOUNTER — Other Ambulatory Visit: Payer: Self-pay | Admitting: Oncology

## 2019-11-14 ENCOUNTER — Other Ambulatory Visit: Payer: Self-pay | Admitting: Oncology

## 2019-11-14 DIAGNOSIS — Z853 Personal history of malignant neoplasm of breast: Secondary | ICD-10-CM

## 2019-12-23 ENCOUNTER — Other Ambulatory Visit: Payer: Self-pay | Admitting: Oncology

## 2019-12-26 ENCOUNTER — Ambulatory Visit
Admission: RE | Admit: 2019-12-26 | Discharge: 2019-12-26 | Disposition: A | Discharge status: Home / Self Care | Payer: Commercial Managed Care - PPO | Source: Ambulatory Visit | Attending: Oncology | Admitting: Oncology

## 2019-12-26 ENCOUNTER — Other Ambulatory Visit: Payer: Self-pay

## 2019-12-26 DIAGNOSIS — Z853 Personal history of malignant neoplasm of breast: Secondary | ICD-10-CM
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
## 2020-01-28 ENCOUNTER — Inpatient Hospital Stay: Payer: Commercial Managed Care - PPO | Attending: Oncology | Admitting: Oncology

## 2020-01-28 ENCOUNTER — Other Ambulatory Visit: Payer: Self-pay

## 2020-01-28 ENCOUNTER — Inpatient Hospital Stay: Payer: Commercial Managed Care - PPO

## 2020-01-28 VITALS — BP 132/81 | HR 89 | Temp 97.6°F | Resp 18 | Ht 63.0 in | Wt 183.3 lb

## 2020-01-28 DIAGNOSIS — C50411 Malignant neoplasm of upper-outer quadrant of right female breast: Secondary | ICD-10-CM

## 2020-01-28 DIAGNOSIS — Z170 Estrogen receptor positive status [ER+]: Secondary | ICD-10-CM | POA: Diagnosis not present

## 2020-01-28 DIAGNOSIS — Z923 Personal history of irradiation: Secondary | ICD-10-CM | POA: Diagnosis not present

## 2020-01-28 DIAGNOSIS — Z79810 Long term (current) use of selective estrogen receptor modulators (SERMs): Secondary | ICD-10-CM | POA: Insufficient documentation

## 2020-01-28 LAB — COMPREHENSIVE METABOLIC PANEL
ALT: 19 U/L (ref 0–44)
AST: 15 U/L (ref 15–41)
Albumin: 3.8 g/dL (ref 3.5–5.0)
Alkaline Phosphatase: 58 U/L (ref 38–126)
Anion gap: 6 (ref 5–15)
BUN: 17 mg/dL (ref 8–23)
CO2: 27 mmol/L (ref 22–32)
Calcium: 9.3 mg/dL (ref 8.9–10.3)
Chloride: 108 mmol/L (ref 98–111)
Creatinine, Ser: 0.63 mg/dL (ref 0.44–1.00)
GFR, Estimated: >60 mL/min (ref >60)
Glucose, Bld: 105 mg/dL — ABNORMAL HIGH (ref 70–99)
Potassium: 4.7 mmol/L (ref 3.5–5.1)
Sodium: 141 mmol/L (ref 135–145)
Total Bilirubin: 0.4 mg/dL (ref 0.3–1.2)
Total Protein: 6.6 g/dL (ref 6.5–8.1)

## 2020-01-28 LAB — CBC WITH DIFFERENTIAL/PLATELET
Abs Immature Granulocytes: 0.02 10*3/uL (ref 0.00–0.07)
Basophils Absolute: 0.1 10*3/uL (ref 0.0–0.1)
Basophils Relative: 1 %
Eosinophils Absolute: 0.1 10*3/uL (ref 0.0–0.5)
Eosinophils Relative: 2 %
HCT: 38.3 % (ref 36.0–46.0)
Hemoglobin: 12.7 g/dL (ref 12.0–15.0)
Immature Granulocytes: 0 %
Lymphocytes Relative: 24 %
Lymphs Abs: 1.5 10*3/uL (ref 0.7–4.0)
MCH: 29.3 pg (ref 26.0–34.0)
MCHC: 33.2 g/dL (ref 30.0–36.0)
MCV: 88.5 fL (ref 80.0–100.0)
Monocytes Absolute: 0.5 10*3/uL (ref 0.1–1.0)
Monocytes Relative: 8 %
Neutro Abs: 4 10*3/uL (ref 1.7–7.7)
Neutrophils Relative %: 65 %
Platelets: 199 10*3/uL (ref 150–400)
RBC: 4.33 MIL/uL (ref 3.87–5.11)
RDW: 12.5 % (ref 11.5–15.5)
WBC: 6.2 10*3/uL (ref 4.0–10.5)
nRBC: 0 % (ref 0.0–0.2)

## 2020-01-28 MED ORDER — TAMOXIFEN CITRATE 20 MG PO TABS: 20.0000 mg | ORAL_TABLET | Freq: Every day | ORAL | 4 refills | Status: DC

## 2020-01-28 MED ORDER — VENLAFAXINE HCL ER 37.5 MG PO CP24: 37.5000 mg | ORAL_CAPSULE | Freq: Every day | ORAL | 4 refills | Status: DC

## 2020-01-30 ENCOUNTER — Other Ambulatory Visit: Payer: Self-pay | Admitting: *Deleted

## 2020-01-30 MED ORDER — VENLAFAXINE HCL ER 37.5 MG PO CP24: 37.5000 mg | ORAL_CAPSULE | Freq: Every day | ORAL | 4 refills | Status: DC

## 2020-01-30 MED ORDER — TAMOXIFEN CITRATE 20 MG PO TABS: 20.0000 mg | ORAL_TABLET | Freq: Every day | ORAL | 4 refills | Status: DC

## 2020-01-31 ENCOUNTER — Telehealth: Payer: Self-pay | Admitting: Oncology

## 2020-01-31 NOTE — Telephone Encounter (Signed)
Scheduled per 10/25 los. Pt called in and confirmed 10/24 appts

## 2020-05-08 ENCOUNTER — Ambulatory Visit
Admission: RE | Admit: 2020-05-08 | Discharge: 2020-05-08 | Disposition: A | Discharge status: Home / Self Care | Payer: Commercial Managed Care - PPO | Source: Ambulatory Visit | Attending: Oncology | Admitting: Oncology

## 2020-05-08 ENCOUNTER — Other Ambulatory Visit: Payer: Self-pay

## 2020-05-08 DIAGNOSIS — Z170 Estrogen receptor positive status [ER+]: Secondary | ICD-10-CM

## 2020-05-08 DIAGNOSIS — C50411 Malignant neoplasm of upper-outer quadrant of right female breast: Secondary | ICD-10-CM

## 2020-05-08 MED ORDER — GADOBUTROL 1 MMOL/ML IV SOLN
8.0000 mL | Freq: Once | INTRAVENOUS | Status: AC | PRN
  Administered 2020-05-08: 8 mL via INTRAVENOUS

## 2020-05-12 ENCOUNTER — Encounter: Payer: Self-pay | Admitting: Oncology

## 2020-05-23 DIAGNOSIS — M25532 Pain in left wrist: Secondary | ICD-10-CM | POA: Insufficient documentation

## 2020-06-03 ENCOUNTER — Other Ambulatory Visit: Payer: Commercial Managed Care - PPO

## 2020-06-03 DIAGNOSIS — Z20822 Contact with and (suspected) exposure to covid-19: Secondary | ICD-10-CM

## 2020-06-04 LAB — NOVEL CORONAVIRUS, NAA: SARS-CoV-2, NAA: NOT DETECTED

## 2020-06-04 LAB — SARS-COV-2, NAA 2 DAY TAT

## 2020-11-24 ENCOUNTER — Other Ambulatory Visit: Payer: Self-pay | Admitting: Oncology

## 2020-11-24 DIAGNOSIS — Z9889 Other specified postprocedural states: Secondary | ICD-10-CM

## 2020-11-24 DIAGNOSIS — Z853 Personal history of malignant neoplasm of breast: Secondary | ICD-10-CM

## 2020-12-31 ENCOUNTER — Ambulatory Visit
Admission: RE | Admit: 2020-12-31 | Discharge: 2020-12-31 | Disposition: A | Discharge status: Home / Self Care | Payer: Commercial Managed Care - PPO | Source: Ambulatory Visit | Attending: Internal Medicine | Admitting: Internal Medicine

## 2020-12-31 ENCOUNTER — Ambulatory Visit
Admission: RE | Admit: 2020-12-31 | Discharge: 2020-12-31 | Disposition: A | Discharge status: Home / Self Care | Payer: Commercial Managed Care - PPO | Source: Ambulatory Visit | Attending: Oncology | Admitting: Oncology

## 2020-12-31 ENCOUNTER — Other Ambulatory Visit: Payer: Self-pay

## 2020-12-31 ENCOUNTER — Other Ambulatory Visit: Payer: Self-pay | Admitting: Internal Medicine

## 2020-12-31 DIAGNOSIS — Z9889 Other specified postprocedural states: Secondary | ICD-10-CM

## 2020-12-31 DIAGNOSIS — R9389 Abnormal findings on diagnostic imaging of other specified body structures: Secondary | ICD-10-CM

## 2020-12-31 DIAGNOSIS — Z853 Personal history of malignant neoplasm of breast: Secondary | ICD-10-CM

## 2020-12-31 MED ORDER — IOPAMIDOL (ISOVUE-300) INJECTION 61%
75.0000 mL | Freq: Once | INTRAVENOUS | Status: AC | PRN
  Administered 2020-12-31: 75 mL via INTRAVENOUS

## 2021-01-25 NOTE — Progress Notes (Signed)
[place]  [phone] [phone] I, Lurline Del MD, have reviewed the above documentation for accuracy and completeness, and I agree with the above.     ID: Rutherford Limerick DOB: [birth year]  MR#: [id number]  CSN#:[id number]  Patient Care Team: Ginger Organ., MD as PCP - General (Internal Medicine) Fanny Skates, MD as Consulting Physician (General Surgery) Eppie Gibson, MD as Attending Physician (Radiation Oncology) Rolm Bookbinder, MD as Consulting Physician (Dermatology) Ronald Lobo, MD as Consulting Physician (Gastroenterology) Arvella Nigh, MD as Consulting Physician (Obstetrics and Gynecology) Donae Kueker, Virgie Dad, MD as Consulting Physician (Oncology) Delice Bison, Charlestine Massed, NP as Nurse Practitioner (Hematology and Oncology) OTHER MD:   CHIEF COMPLAINT: Estrogen receptor positive breast cancer  CURRENT TREATMENT: tamoxifen   INTERVAL HISTORY: Samanta returns today for follow-up of her estrogen receptor positive breast cancer.   She continues on tamoxifen.  She tolerates this well, with minimal hot flashes.  Venlafaxine has helped.  She does have some arthralgias and myalgias and she wonders sometimes if tamoxifen could be the cause.  Since her last visit, she underwent breast MRI on 05/08/2020 showing: breast composition C; no evidence of malignancy  She also bilateral diagnostic mammography with tomography at [facility] on 12/31/2020 showing: breast density category C; no evidence of malignancy in either breast.   Of note, she also underwent chest CT on 12/31/2020 for further evaluation of an abnormal x-ray, obtained for evaluation of cough, which suggested a possible right middle lobe pneumonia.. CT showed no evidence of pneumonia, also no evidence of metastatic disease.   REVIEW OF SYSTEMS: Yuvonne has a little bit of a cough.  There is no obvious cause for that so she has an appointment at [facility] for further  evaluation.  We discussed the possibility that reflux or asthma might be the cause.  She walks between 2 and 93mles several times a week.  She also goes to the gym 3 times a week with a trainer.  A detailed review of systems today was otherwise stable.   COVID 19 VACCINATION STATUS: Status post Moderna x5 as of October 2022   BREAST CANCER HISTORY: From the original intake note:  NChristineundergoes high risk breast cancer screening because of a significant family history and on 12/28/2015 bilateral breast MRI found a 6 mm enhancing mass in the upper outer quadrant of the right breast there were no other abnormalities including no left breast findings and no adenopathy. Biopsy of the right breast mass in question documented invasive ductal carcinoma, grade 1, estrogen receptor 100% positive, with strong staining intensity, progesterone receptor 30% positive, with moderate staining intensity, with an MIB-1 of 10%, and no HER-2 amplification, the signals ratio being 0.72 and the number per cell 1.15. A second suspicious area also in the upper-outer quadrant was biopsied at the same time and showed only usual ductal hyperplasia.  Her subsequent history is as detailed below.   PAST MEDICAL HISTORY: Past Medical History:  Diagnosis Date   Asthma    allergy induced asthma as child, none now   [place]ast cancer ([facility] 01[place]   right breast cancer   Family history of breast cancer    Family history of ovarian cancer    Headache    History of radiation therapy 04/20/16- 05/17/16   Right Breast 40.05 Gy in 15 fractions and Right Breast boost 10 Gy in 5 fractions.    [place]    in [place]   Osteoporosis  Personal history of radiation therapy 2017    PAST SURGICAL HISTORY: Past Surgical History:  Procedure Laterality Date   ANKLE FRACTURE SURGERY Right 2015   BREAST LUMPECTOMY Right 02/10/2016   BREAST LUMPECTOMY WITH RADIOACTIVE SEED AND SENTINEL LYMPH NODE BIOPSY Right 02/10/2016   Procedure:  RIGHT BREAST LUMPECTOMY WITH RADIOACTIVE SEED AND RIGHT SENTINEL LYMPH NODE BIOPSY WITH INJECT BLUE DYE;  Surgeon: Fanny Skates, MD;  Location: [place];  Service: General;  Laterality: Right;   DILATION AND CURETTAGE OF Natalia   cyst removed   TONSILLECTOMY  1966    FAMILY HISTORY Family History  Problem Relation Age of Onset   Heart attack Mother    Cancer - Lung Father    Breast cancer Sister 72   Ovarian cancer Maternal Grandmother 20   Stroke Maternal Grandfather    Stroke Paternal Grandfather    Breast cancer Other        great Aunt   Breast cancer Other        great Aunt  As of October 2017 Mellissa's parents are still living, in their early 63. Her father had lung cancer diagnosed at age 32 (essentially a nonsmoker); a maternal grandmother had ovarian cancer at age 57 the patient has 2 brothers, 2 sisters, with one sister diagnosed with breast cancer at age 68 ("and doing fine").   GYNECOLOGIC HISTORY:  Patient's last menstrual period was 05/04/2011. Energy age 65, first live birth age 65, menopause age 65, on vaginal estrogen cream until October 2017. 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 [name] is a Marketing executive at working in [place] as an Chief Financial Officer for a company that makes [employer] products.. Their daughter [name] got her my masters in art history at [employer] and is going to be working as a PhD on [employer] and crafts from the [place] through [place]: In place   HEALTH MAINTENANCE: Social History   Tobacco Use   Smoking status: Never   Smokeless tobacco: Never  Substance Use Topics   Alcohol use: Yes    Alcohol/week: 0.0 standard drinks    Comment: 2 glasses wine a day   Drug use: No     Colonoscopy:  PAP:  Bone  density:   Allergies  Allergen Reactions   Gluten Meal Nausea Only   Reclast [Zoledronic Acid] Other (See Comments)    Current Outpatient Medications  Medication Sig Dispense Refill   betamethasone valerate ointment (VALISONE) 0.1 %      cholecalciferol (VITAMIN D3) 25 MCG (1000 UT) tablet Take 1,000 Units by mouth daily.     fluorouracil (EFUDEX) 5 % cream Apply topically as needed.     ketoconazole (NIZORAL) 2 % cream Apply 1 application topically daily. 15 g 0   Multiple Vitamins-Minerals (MULTI COMPLETE PO) Take by mouth.     tamoxifen (NOLVADEX) 20 MG tablet Take 1 tablet (20 mg total) by mouth daily. 90 tablet 4   UNABLE TO FIND every morning. Med Name: calcium 600 mg     UNABLE TO FIND every morning. Med Name: vitamin D     valACYclovir (VALTREX) 1000 MG tablet Take 1,000 mg by mouth 2 (two) times daily. 01/08/15 as needed for pain     venlafaxine XR (EFFEXOR-XR)  37.5 MG 24 hr capsule Take 1 capsule (37.5 mg total) by mouth daily with breakfast. 90 capsule 4   vitamin C (ASCORBIC ACID) 500 MG tablet Take 500 mg by mouth daily.     No current facility-administered medications for this visit.    OBJECTIVE: White woman who appears younger than stated  Vitals:   01/26/21 0853  BP: 131/60  Pulse: 72  Resp: 18  Temp: 97.9 F (36.6 C)  SpO2: 98%    Wt Readings from Last 3 Encounters:  01/26/21 186 lb 1.6 oz (84.4 kg)  01/28/20 183 lb 4.8 oz (83.1 kg)  01/24/19 185 lb 11.2 oz (84.2 kg)   Body mass index is 32.97 kg/m.    ECOG FS:1 - Symptomatic but completely ambulatory  Sclerae unicteric, EOMs intact Wearing a mask No cervical or supraclavicular adenopathy Lungs no rales or rhonchi Heart regular rate and rhythm Abd soft, nontender, positive bowel sounds MSK no focal spinal tenderness, no upper extremity lymphedema Neuro: nonfocal, well oriented, appropriate affect Breasts: The right breast is status postlumpectomy and radiation.  There is no evidence of local  recurrence.  The left breast is benign.  Both axillae are benign   LAB RESULTS:  CMP     Component Value Date/Time   NA 141 01/28/2020 1018   NA 140 11/17/2016 1402   K 4.7 01/28/2020 1018   K 4.6 11/17/2016 1402   CL 108 01/28/2020 1018   CO2 27 01/28/2020 1018   CO2 29 11/17/2016 1402   GLUCOSE 105 (H) 01/28/2020 1018   GLUCOSE 119 11/17/2016 1402   BUN 17 01/28/2020 1018   BUN 16.2 11/17/2016 1402   CREATININE 0.63 01/28/2020 1018   CREATININE 0.7 11/17/2016 1402   CALCIUM 9.3 01/28/2020 1018   CALCIUM 9.8 11/17/2016 1402   PROT 6.6 01/28/2020 1018   PROT 7.0 11/17/2016 1402   ALBUMIN 3.8 01/28/2020 1018   ALBUMIN 3.8 11/17/2016 1402   AST 15 01/28/2020 1018   AST 17 11/17/2016 1402   ALT 19 01/28/2020 1018   ALT 20 11/17/2016 1402   ALKPHOS 58 01/28/2020 1018   ALKPHOS 50 11/17/2016 1402   BILITOT 0.4 01/28/2020 1018   BILITOT 0.35 11/17/2016 1402   GFRNONAA >60 01/28/2020 1018   GFRAA >60 01/18/2018 1031    INo results found for: SPEP, UPEP  Lab Results  Component Value Date   WBC 5.9 01/26/2021   NEUTROABS 3.6 01/26/2021   HGB 13.0 01/26/2021   HCT 39.5 01/26/2021   MCV 88.0 01/26/2021   PLT 195 01/26/2021      Chemistry      Component Value Date/Time   NA 141 01/28/2020 1018   NA 140 11/17/2016 1402   K 4.7 01/28/2020 1018   K 4.6 11/17/2016 1402   CL 108 01/28/2020 1018   CO2 27 01/28/2020 1018   CO2 29 11/17/2016 1402   BUN 17 01/28/2020 1018   BUN 16.2 11/17/2016 1402   CREATININE 0.63 01/28/2020 1018   CREATININE 0.7 11/17/2016 1402      Component Value Date/Time   CALCIUM 9.3 01/28/2020 1018   CALCIUM 9.8 11/17/2016 1402   ALKPHOS 58 01/28/2020 1018   ALKPHOS 50 11/17/2016 1402   AST 15 01/28/2020 1018   AST 17 11/17/2016 1402   ALT 19 01/28/2020 1018   ALT 20 11/17/2016 1402   BILITOT 0.4 01/28/2020 1018   BILITOT 0.35 11/17/2016 1402       No results found for: LABCA2  No components  found for: [id number]  No results for  input(s): INR in the last 168 hours.  Urinalysis No results found for: COLORURINE, APPEARANCEUR, LABSPEC, [place], GLUCOSEU, HGBUR, BILIRUBINUR, KETONESUR, PROTEINUR, UROBILINOGEN, NITRITE, LEUKOCYTESUR   STUDIES: CT CHEST W CONTRAST  Result Date: 12/31/2020 CLINICAL DATA:  Cough, abnormal x-ray with possible right middle lobe pneumonia EXAM: CT CHEST WITH CONTRAST TECHNIQUE: Multidetector CT imaging of the chest was performed during intravenous contrast administration. CONTRAST:  41m ISOVUE-300 IOPAMIDOL (ISOVUE-300) INJECTION 61% [place] [address] Results: Creatinine 0.4 mg/dL. COMPARISON:  None. FINDINGS: Cardiovascular: Normal heart size. No pericardial effusion. Thoracic aorta normal in caliber. Mediastinum/Nodes: Thyroid and esophagus are unremarkable. Lungs/Pleura: No consolidation or mass. Mild atelectasis/scarring at the lung bases. No pleural effusion. Upper Abdomen: No acute abnormality. Musculoskeletal: Mild thoracic spine degenerative changes. No acute osseous abnormality. IMPRESSION: No evidence of pneumonia. Electronically Signed   By: PMacy MisM.D.   On: 12/31/2020 11:51   MM DIAG BREAST TOMO BILATERAL  Result Date: 12/31/2020 CLINICAL DATA:  65year old female status post malignant right lumpectomy with radiation therapy in 2017. EXAM: DIGITAL DIAGNOSTIC BILATERAL MAMMOGRAM WITH TOMOSYNTHESIS AND CAD TECHNIQUE: Bilateral digital diagnostic mammography and breast tomosynthesis was performed. The images were evaluated with computer-aided detection. COMPARISON:  Previous exam(s). ACR Breast Density Category c: The breast tissue is heterogeneously dense, which may obscure small masses. FINDINGS: Stable post lumpectomy changes in the upper outer right breast posteriorly. No new or suspicious findings in either breast. The parenchymal pattern is stable. IMPRESSION: 1. No mammographic evidence of malignancy in either breast. 2.  Stable right breast posttreatment changes. RECOMMENDATION: Screening mammogram in one year.(Code:[id number]-B-01Y) I have discussed the findings and recommendations with the patient. If applicable, a reminder letter will be sent to the patient regarding the next appointment. BI-RADS CATEGORY  2: Benign. Electronically Signed   By: SKristopher OppenheimM.D.   On: 12/31/2020 14:23     ELIGIBLE FOR AVAILABLE RESEARCH PROTOCOL: no  ASSESSMENT: 65y.o. BRCA negative [place] woman status post right breast upper outer quadrant biopsy 01/05/2016 for a clinical T1b N0, stage IA invasive ductal carcinoma, grade 1, estrogen and progesterone receptor positive, HER-2 negative, with an MIB-1 of 10%  (1) status post right lumpectomy with sentinel lymph node sampling 02/10/2016 for a pT1c pN0, stage IA invasive ductal carcinoma, grade 1, with negative margins.  (2) Oncotype score of 14 predicts a 10 year risk of recurrence outside the breast of 9% if the patient's only systemic therapy is tamoxifen for 5 years. It also predicts no benefit from adjuvant chemotherapy.  (3) adjuvant radiation 04/20/2016 to 05/17/2016  Site/dose:    1. The Right breast was treated to 40.5 Gy in 15 fractions at 2.67 Gy per fraction. 2. The Right breast was boosted to 10 Gy in 5 fractions at 2 Gy per fraction.  (4) tamoxifen started 02/04/2016  (5) genetics testing on March 02, 2016 [facility] panel offered by MMerrill Lynchfound no deleterious mutations in APC, ATM, BARD1, BMPR1A, BRCA1, BRCA2, BRIP1, CHD1, CDK4, CDKN2A, CHEK2, EPCAM (large rearrangement only), MLH1, MSH2, MSH6, MUTYH, NBN, PALB2, PMS2, PTEN, RAD51C, RAD51D, SMAD4, STK11, and TP53. Sequencing was performed for select regions of POLE and POLD1, and large rearrangement analysis was performed for select regions of GREM1.  (a) patient is "high risk" on the basis of family history and qualifies for breast MRI yearly in addition to yearly  mammography   PLAN: NIzora Gala  is now just about 5 years out from definitive surgery for her breast cancer with no evidence of disease recurrence.  This is very favorable.  She has tolerated tamoxifen well and today we discussed what her options are.  We can stop tamoxifen now.  We can continue it for 5 more years.  We can send a breast cancer index to help [place] make that decision.  She would prefer to continue tamoxifen for 5 more years and I think that is a perfectly reasonable decision.  She likely will have a 1 to 3% further decline in risk.  Tamoxifen also is helpful in terms of bone density.  She is having some arthralgias and myalgias.  She wonders if tamoxifen could be the cause.  I really do not think so but she is going to have the tamoxifen vacation for the next month.  We will have a virtual visit at the end of November and assuming as I expect that what she has been experiencing is arthritis she will go back to tamoxifen at that point.  She does have dense breasts and a significant family history.  It is prudent to continue breast MRI at least until the breast density drops further.  She is agreeable to that  Otherwise says she will see [place] again in 1 year.  She knows to call for any other issue that may develop before then.  Total encounter time 25 minutes.*   Darral Rishel, Virgie Dad, MD  01/26/21 8:54 AM Medical Oncology and Hematology [facility] [place], [place] [address] Tel. [phone]    Fax. [phone]   I, Wilburn Mylar, am acting as scribe for Dr. Virgie Dad. Aiman Noe.  I, Lurline Del MD, have reviewed the above documentation for accuracy and completeness, and I agree with the above.   *Total Encounter Time as defined by the Centers for Medicare and Medicaid Services includes, in addition to the face-to-face time of a patient visit (documented in the note above) non-face-to-face time: obtaining and reviewing outside history, ordering and  reviewing medications, tests or procedures, care coordination (communications with other health care professionals or caregivers) and documentation in the medical record.

## 2021-01-26 ENCOUNTER — Other Ambulatory Visit: Payer: Self-pay

## 2021-01-26 ENCOUNTER — Inpatient Hospital Stay: Payer: Commercial Managed Care - PPO | Attending: Oncology

## 2021-01-26 ENCOUNTER — Inpatient Hospital Stay (HOSPITAL_BASED_OUTPATIENT_CLINIC_OR_DEPARTMENT_OTHER): Payer: Commercial Managed Care - PPO | Admitting: Oncology

## 2021-01-26 VITALS — BP 131/60 | HR 72 | Temp 97.9°F | Resp 18 | Ht 63.0 in | Wt 186.1 lb

## 2021-01-26 DIAGNOSIS — Z85820 Personal history of malignant melanoma of skin: Secondary | ICD-10-CM | POA: Insufficient documentation

## 2021-01-26 DIAGNOSIS — Z803 Family history of malignant neoplasm of breast: Secondary | ICD-10-CM | POA: Insufficient documentation

## 2021-01-26 DIAGNOSIS — M47814 Spondylosis without myelopathy or radiculopathy, thoracic region: Secondary | ICD-10-CM | POA: Insufficient documentation

## 2021-01-26 DIAGNOSIS — M2550 Pain in unspecified joint: Secondary | ICD-10-CM | POA: Diagnosis not present

## 2021-01-26 DIAGNOSIS — M810 Age-related osteoporosis without current pathological fracture: Secondary | ICD-10-CM | POA: Diagnosis not present

## 2021-01-26 DIAGNOSIS — Z8041 Family history of malignant neoplasm of ovary: Secondary | ICD-10-CM | POA: Insufficient documentation

## 2021-01-26 DIAGNOSIS — Z170 Estrogen receptor positive status [ER+]: Secondary | ICD-10-CM | POA: Insufficient documentation

## 2021-01-26 DIAGNOSIS — C50411 Malignant neoplasm of upper-outer quadrant of right female breast: Secondary | ICD-10-CM | POA: Diagnosis not present

## 2021-01-26 DIAGNOSIS — M791 Myalgia, unspecified site: Secondary | ICD-10-CM | POA: Insufficient documentation

## 2021-01-26 DIAGNOSIS — R059 Cough, unspecified: Secondary | ICD-10-CM | POA: Diagnosis not present

## 2021-01-26 DIAGNOSIS — Z79899 Other long term (current) drug therapy: Secondary | ICD-10-CM | POA: Insufficient documentation

## 2021-01-26 DIAGNOSIS — R232 Flushing: Secondary | ICD-10-CM | POA: Insufficient documentation

## 2021-01-26 DIAGNOSIS — J45909 Unspecified asthma, uncomplicated: Secondary | ICD-10-CM | POA: Diagnosis not present

## 2021-01-26 DIAGNOSIS — Z923 Personal history of irradiation: Secondary | ICD-10-CM | POA: Diagnosis not present

## 2021-01-26 DIAGNOSIS — Z79810 Long term (current) use of selective estrogen receptor modulators (SERMs): Secondary | ICD-10-CM | POA: Diagnosis not present

## 2021-01-26 LAB — CMP (CANCER CENTER ONLY)
ALT: 25 U/L (ref 0–44)
AST: 22 U/L (ref 15–41)
Albumin: 3.9 g/dL (ref 3.5–5.0)
Alkaline Phosphatase: 57 U/L (ref 38–126)
Anion gap: 6 (ref 5–15)
BUN: 14 mg/dL (ref 8–23)
CO2: 24 mmol/L (ref 22–32)
Calcium: 8.7 mg/dL — ABNORMAL LOW (ref 8.9–10.3)
Chloride: 106 mmol/L (ref 98–111)
Creatinine: 0.6 mg/dL (ref 0.44–1.00)
GFR, Estimated: >60 mL/min (ref >60)
Glucose, Bld: 117 mg/dL — ABNORMAL HIGH (ref 70–99)
Potassium: 4.3 mmol/L (ref 3.5–5.1)
Sodium: 136 mmol/L (ref 135–145)
Total Bilirubin: 0.5 mg/dL (ref 0.3–1.2)
Total Protein: 7 g/dL (ref 6.5–8.1)

## 2021-01-26 LAB — CBC WITH DIFFERENTIAL/PLATELET
Abs Immature Granulocytes: 0.02 10*3/uL (ref 0.00–0.07)
Basophils Absolute: 0 10*3/uL (ref 0.0–0.1)
Basophils Relative: 1 %
Eosinophils Absolute: 0.1 10*3/uL (ref 0.0–0.5)
Eosinophils Relative: 2 %
HCT: 39.5 % (ref 36.0–46.0)
Hemoglobin: 13 g/dL (ref 12.0–15.0)
Immature Granulocytes: 0 %
Lymphocytes Relative: 28 %
Lymphs Abs: 1.6 10*3/uL (ref 0.7–4.0)
MCH: 29 pg (ref 26.0–34.0)
MCHC: 32.9 g/dL (ref 30.0–36.0)
MCV: 88 fL (ref 80.0–100.0)
Monocytes Absolute: 0.5 10*3/uL (ref 0.1–1.0)
Monocytes Relative: 8 %
Neutro Abs: 3.6 10*3/uL (ref 1.7–7.7)
Neutrophils Relative %: 61 %
Platelets: 195 10*3/uL (ref 150–400)
RBC: 4.49 MIL/uL (ref 3.87–5.11)
RDW: 12.1 % (ref 11.5–15.5)
WBC: 5.9 10*3/uL (ref 4.0–10.5)
nRBC: 0 % (ref 0.0–0.2)

## 2021-01-27 ENCOUNTER — Ambulatory Visit: Payer: PRIVATE HEALTH INSURANCE | Admitting: Oncology

## 2021-01-27 ENCOUNTER — Other Ambulatory Visit: Payer: PRIVATE HEALTH INSURANCE

## 2021-02-18 DIAGNOSIS — R490 Dysphonia: Secondary | ICD-10-CM | POA: Insufficient documentation

## 2021-02-18 DIAGNOSIS — R053 Chronic cough: Secondary | ICD-10-CM | POA: Insufficient documentation

## 2021-03-03 NOTE — Progress Notes (Signed)
[place]  [phone] [phone]  ID: Julie Mack DOB: 05/04/1955  MR#: [id number]  SPQ#:[id number]  Patient Care Team: Ginger Organ., MD as PCP - General (Internal Medicine) Fanny Skates, MD as Consulting Physician (General Surgery) Eppie Gibson, MD as Attending Physician (Radiation Oncology) Rolm Bookbinder, MD as Consulting Physician (Dermatology) Ronald Lobo, MD as Consulting Physician (Gastroenterology) Arvella Nigh, MD as Consulting Physician (Obstetrics and Gynecology) Magrinat, Virgie Dad, MD as Consulting Physician (Oncology) Delice Bison, Charlestine Massed, NP as Nurse Practitioner (Hematology and Oncology) OTHER MD:   I connected with Rutherford Limerick on 03/04/21 at  8:30 AM EST by video enabled telemedicine visit and verified that I am speaking with the correct person using two identifiers.   I discussed the limitations, risks, security and privacy concerns of performing an evaluation and management service by telemedicine and the availability of in-person appointments. I also discussed with the patient that there may be a patient responsible charge related to this service. The patient expressed understanding and agreed to proceed.   Other persons participating in the visit and their role in the encounter: None  Patient's location: Home Provider's location: [place]   I provided 10 minutes of face-to-face video visit time during this encounter, and > 50% was spent counseling as documented under my assessment & plan.   CHIEF COMPLAINT: Estrogen receptor positive breast cancer  CURRENT TREATMENT: tamoxifen   INTERVAL HISTORY: Julie Mack was contacted today for follow-up of her estrogen receptor positive breast cancer.   She has held tamoxifen since her last visit on 01/26/2021 due to concern for worsening arthralgias and myalgias.  Now a month off her medication she finds that the arthralgias and myalgias are  pretty much the same.  Accordingly we are going back on tamoxifen.   REVIEW OF SYSTEMS: A detailed review of systems today was otherwise stable   COVID 19 VACCINATION STATUS: Status post Moderna x5 as of October 2022   BREAST CANCER HISTORY: From the original intake note:  Julie Mack undergoes high risk breast cancer screening because of a significant family history and on 12/28/2015 bilateral breast MRI found a 6 mm enhancing mass in the upper outer quadrant of the right breast there were no other abnormalities including no left breast findings and no adenopathy. Biopsy of the right breast mass in question documented invasive ductal carcinoma, grade 1, estrogen receptor 100% positive, with strong staining intensity, progesterone receptor 30% positive, with moderate staining intensity, with an MIB-1 of 10%, and no HER-2 amplification, the signals ratio being 0.72 and the number per cell 1.15. A second suspicious area also in the upper-outer quadrant was biopsied at the same time and showed only usual ductal hyperplasia.  Her subsequent history is as detailed below.   PAST MEDICAL HISTORY: Past Medical History:  Diagnosis Date   Asthma    allergy induced asthma as child, none now   Breast cancer ([place]) 01/2016   right breast cancer   Family history of breast cancer    Family history of ovarian cancer    Headache    History of radiation therapy 04/20/16- 05/17/16   Right Breast 40.05 Gy in 15 fractions and Right Breast boost 10 Gy in 5 fractions.    Melanoma ([place])    in situ   Osteoporosis    Personal history of radiation therapy 2017    PAST SURGICAL HISTORY: Past Surgical History:  Procedure Laterality Date   ANKLE FRACTURE SURGERY Right 2015  BREAST LUMPECTOMY Right 02/10/2016   BREAST LUMPECTOMY WITH RADIOACTIVE SEED AND SENTINEL LYMPH NODE BIOPSY Right 02/10/2016   Procedure: RIGHT BREAST LUMPECTOMY WITH RADIOACTIVE SEED AND RIGHT SENTINEL LYMPH NODE BIOPSY WITH INJECT BLUE DYE;   Surgeon: Fanny Skates, MD;  Location: [facility];  Service: General;  Laterality: Right;   DILATION AND CURETTAGE OF [place]   cyst removed   TONSILLECTOMY  1966    FAMILY HISTORY Family History  Problem Relation Age of Onset   Heart attack Mother    Cancer - Lung Father    Breast cancer Sister 57   Ovarian cancer Maternal Grandmother 57   Stroke Maternal Grandfather    Stroke Paternal Grandfather    Breast cancer Other        great Aunt   Breast cancer Other        great Aunt  As of October 2017 Julie Mack's parents are still living, in their early 31s. Her father had lung cancer diagnosed at age 42 (essentially a nonsmoker); a maternal grandmother had ovarian cancer at age 52 the patient has 2 brothers, 2 sisters, with one sister diagnosed with breast cancer at age 37 ("and doing fine").   GYNECOLOGIC HISTORY:  Patient's last menstrual period was 05/04/2011. Energy age 12, first live birth age 55, menopause age 66, on vaginal estrogen cream until October 2017. 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 Their son Julie Mack is a Marketing executive at working in [place] as an Chief Financial Officer for a company that makes [employer] products.. Their daughter Julie Mack got her my masters in art history at [place] and is going to be working as a PhD on [employer] and crafts from the [place] through [place]: In place   HEALTH MAINTENANCE: Social History   Tobacco Use   Smoking status: Never   Smokeless tobacco: Never  Substance Use Topics   Alcohol use: Yes    Alcohol/week: 0.0 standard drinks    Comment: 2 glasses wine a day   Drug use: No     Colonoscopy:  PAP:  Bone density:   Allergies  Allergen Reactions   Gluten Meal Nausea Only   Reclast [Zoledronic Acid] Other (See  Comments)    Current Outpatient Medications  Medication Sig Dispense Refill   tamoxifen (NOLVADEX) 20 MG tablet Take 1 tablet (20 mg total) by mouth daily. 90 tablet 4   betamethasone valerate ointment (VALISONE) 0.1 %      cholecalciferol (VITAMIN D3) 25 MCG (1000 UT) tablet Take 1,000 Units by mouth daily.     fluorouracil (EFUDEX) 5 % cream Apply topically as needed.     ketoconazole (NIZORAL) 2 % cream Apply 1 application topically daily. 15 g 0   Multiple Vitamins-Minerals (MULTI COMPLETE PO) Take by mouth.     tamoxifen (NOLVADEX) 20 MG tablet Take 1 tablet (20 mg total) by mouth daily. 90 tablet 4   UNABLE TO FIND every morning. Med Name: calcium 600 mg     UNABLE TO FIND every morning. Med Name: vitamin D     valACYclovir (VALTREX) 1000 MG tablet Take 1,000 mg by mouth 2 (two) times daily. 01/08/15 as needed for pain     venlafaxine XR (EFFEXOR-XR) 37.5 MG 24 hr capsule Take 1 capsule (37.5 mg  total) by mouth daily with breakfast. 90 capsule 4   vitamin C (ASCORBIC ACID) 500 MG tablet Take 500 mg by mouth daily.     No current facility-administered medications for this visit.    OBJECTIVE: White woman who appears younger than stated age  There were no vitals filed for this visit.   Wt Readings from Last 3 Encounters:  01/26/21 186 lb 1.6 oz (84.4 kg)  01/28/20 183 lb 4.8 oz (83.1 kg)  01/24/19 185 lb 11.2 oz (84.2 kg)   There is no height or weight on file to calculate BMI.    ECOG FS:1 - Symptomatic but completely ambulatory  Telemedicine visit 03/04/2021      LAB RESULTS:  CMP     Component Value Date/Time   NA 136 01/26/2021 0840   NA 140 11/17/2016 1402   K 4.3 01/26/2021 0840   K 4.6 11/17/2016 1402   CL 106 01/26/2021 0840   CO2 24 01/26/2021 0840   CO2 29 11/17/2016 1402   GLUCOSE 117 (H) 01/26/2021 0840   GLUCOSE 119 11/17/2016 1402   BUN 14 01/26/2021 0840   BUN 16.2 11/17/2016 1402   CREATININE 0.60 01/26/2021 0840   CREATININE 0.7 11/17/2016  1402   CALCIUM 8.7 (L) 01/26/2021 0840   CALCIUM 9.8 11/17/2016 1402   PROT 7.0 01/26/2021 0840   PROT 7.0 11/17/2016 1402   ALBUMIN 3.9 01/26/2021 0840   ALBUMIN 3.8 11/17/2016 1402   AST 22 01/26/2021 0840   AST 17 11/17/2016 1402   ALT 25 01/26/2021 0840   ALT 20 11/17/2016 1402   ALKPHOS 57 01/26/2021 0840   ALKPHOS 50 11/17/2016 1402   BILITOT 0.5 01/26/2021 0840   BILITOT 0.35 11/17/2016 1402   GFRNONAA >60 01/26/2021 0840   GFRAA >60 01/18/2018 1031    INo results found for: SPEP, UPEP  Lab Results  Component Value Date   WBC 5.9 01/26/2021   NEUTROABS 3.6 01/26/2021   HGB 13.0 01/26/2021   HCT 39.5 01/26/2021   MCV 88.0 01/26/2021   PLT 195 01/26/2021      Chemistry      Component Value Date/Time   NA 136 01/26/2021 0840   NA 140 11/17/2016 1402   K 4.3 01/26/2021 0840   K 4.6 11/17/2016 1402   CL 106 01/26/2021 0840   CO2 24 01/26/2021 0840   CO2 29 11/17/2016 1402   BUN 14 01/26/2021 0840   BUN 16.2 11/17/2016 1402   CREATININE 0.60 01/26/2021 0840   CREATININE 0.7 11/17/2016 1402      Component Value Date/Time   CALCIUM 8.7 (L) 01/26/2021 0840   CALCIUM 9.8 11/17/2016 1402   ALKPHOS 57 01/26/2021 0840   ALKPHOS 50 11/17/2016 1402   AST 22 01/26/2021 0840   AST 17 11/17/2016 1402   ALT 25 01/26/2021 0840   ALT 20 11/17/2016 1402   BILITOT 0.5 01/26/2021 0840   BILITOT 0.35 11/17/2016 1402       No results found for: LABCA2  No components found for: LABCA125  No results for input(s): INR in the last 168 hours.  Urinalysis No results found for: COLORURINE, APPEARANCEUR, LABSPEC, PHURINE, GLUCOSEU, HGBUR, BILIRUBINUR, KETONESUR, PROTEINUR, UROBILINOGEN, NITRITE, LEUKOCYTESUR   STUDIES: No results found.   ELIGIBLE FOR AVAILABLE RESEARCH PROTOCOL: no  ASSESSMENT: 65 y.o. BRCA negative [place] woman status post right breast upper outer quadrant biopsy 01/05/2016 for a clinical T1b N0, stage IA invasive ductal carcinoma, grade 1,  estrogen and progesterone receptor positive, HER-2 negative,  with an MIB-1 of 10%  (1) status post right lumpectomy with sentinel lymph node sampling 02/10/2016 for a pT1c pN0, stage IA invasive ductal carcinoma, grade 1, with negative margins.  (2) Oncotype score of 14 predicts a 10 year risk of recurrence outside the breast of 9% if the patient's only systemic therapy is tamoxifen for 5 years. It also predicts no benefit from adjuvant chemotherapy.  (3) adjuvant radiation 04/20/2016 to 05/17/2016  Site/dose:    1. The Right breast was treated to 40.5 Gy in 15 fractions at 2.67 Gy per fraction. 2. The Right breast was boosted to 10 Gy in 5 fractions at 2 Gy per fraction.  (4) tamoxifen started 02/04/2016, to be continued a total of 10 years  (5) genetics testing on March 02, 2016 through the [facility] cancer panel offered by [employer] found no deleterious mutations in APC, ATM, BARD1, BMPR1A, BRCA1, BRCA2, BRIP1, CHD1, CDK4, CDKN2A, CHEK2, EPCAM (large rearrangement only), MLH1, MSH2, MSH6, MUTYH, NBN, PALB2, PMS2, PTEN, RAD51C, RAD51D, SMAD4, STK11, and TP53. Sequencing was performed for select regions of POLE and POLD1, and large rearrangement analysis was performed for select regions of GREM1.  (a) patient is "high risk" on the basis of family history and qualifies for breast MRI yearly in addition to yearly mammography   PLAN: Julie Mack is now a little over 5 years out from definitive surgery for her breast cancer with no evidence of disease recurrence.  This is very favorable.  We had previously discussed continuing tamoxifen for 5 years or not and took off treatment for a month to ascertain whether tamoxifen was causing her arthralgias and myalgias which are mild to moderate.  Going off tamoxifen really did not help so the plan is going to be to continue tamoxifen (and low-dose venlafaxine) and additional 5 years.  I have put in those prescriptions to her pharmacy  today.  We are also doing intensified screening with an MRI of the breast once a year alternating and plan to continue until her breast density decreases.  That order also is in place.  She will see [place] again November 2023.  That appointment has already been scheduled.     Torrion Witter, Virgie Dad, MD  03/04/21 8:32 AM Medical Oncology and Hematology [facility] [place], [place] [address] Tel. [phone]    Fax. [phone]   I, Wilburn Mylar, am acting as scribe for Dr. Virgie Dad. Julie Mack.  I, Lurline Del MD, have reviewed the above documentation for accuracy and completeness, and I agree with the above.   *Total Encounter Time as defined by the Centers for Medicare and Medicaid Services includes, in addition to the face-to-face time of a patient visit (documented in the note above) non-face-to-face time: obtaining and reviewing outside history, ordering and reviewing medications, tests or procedures, care coordination (communications with other health care professionals or caregivers) and documentation in the medical record.

## 2021-03-04 ENCOUNTER — Telehealth: Payer: Commercial Managed Care - PPO | Admitting: Oncology

## 2021-03-04 DIAGNOSIS — C50411 Malignant neoplasm of upper-outer quadrant of right female breast: Secondary | ICD-10-CM | POA: Diagnosis not present

## 2021-03-04 DIAGNOSIS — Z170 Estrogen receptor positive status [ER+]: Secondary | ICD-10-CM

## 2021-03-04 MED ORDER — VENLAFAXINE HCL ER 37.5 MG PO CP24: 37.5000 mg | ORAL_CAPSULE | Freq: Every day | ORAL | 4 refills | Status: DC

## 2021-03-04 MED ORDER — TAMOXIFEN CITRATE 20 MG PO TABS: 20.0000 mg | ORAL_TABLET | Freq: Every day | ORAL | 4 refills | Status: AC

## 2021-03-05 ENCOUNTER — Other Ambulatory Visit: Payer: Self-pay | Admitting: Oncology

## 2021-12-08 ENCOUNTER — Other Ambulatory Visit: Payer: Self-pay

## 2021-12-08 ENCOUNTER — Other Ambulatory Visit: Payer: Self-pay | Admitting: Hematology and Oncology

## 2021-12-08 ENCOUNTER — Other Ambulatory Visit: Payer: Self-pay | Admitting: Oncology

## 2021-12-08 DIAGNOSIS — Z170 Estrogen receptor positive status [ER+]: Secondary | ICD-10-CM

## 2021-12-08 DIAGNOSIS — Z1231 Encounter for screening mammogram for malignant neoplasm of breast: Secondary | ICD-10-CM

## 2022-01-01 ENCOUNTER — Ambulatory Visit: Payer: Commercial Managed Care - PPO

## 2022-01-05 ENCOUNTER — Ambulatory Visit
Admission: RE | Admit: 2022-01-05 | Discharge: 2022-01-05 | Disposition: A | Discharge status: Home / Self Care | Payer: Commercial Managed Care - PPO | Source: Ambulatory Visit | Attending: Hematology and Oncology | Admitting: Hematology and Oncology

## 2022-01-05 DIAGNOSIS — Z1231 Encounter for screening mammogram for malignant neoplasm of breast: Secondary | ICD-10-CM

## 2022-01-21 NOTE — Progress Notes (Signed)
Patient Care Team: Ginger Organ., MD as PCP - General (Internal Medicine) Fanny Skates, MD as Consulting Physician (General Surgery) Eppie Gibson, MD as Attending Physician (Radiation Oncology) Rolm Bookbinder, MD as Consulting Physician (Dermatology) Ronald Lobo, MD as Consulting Physician (Gastroenterology) Arvella Nigh, MD as Consulting Physician (Obstetrics and Gynecology) Magrinat, Virgie Dad, MD (Inactive) as Consulting Physician (Oncology) Delice Bison Charlestine Massed, NP as Nurse Practitioner (Hematology and Oncology)  DIAGNOSIS:  Encounter Diagnosis  Name Primary?   Malignant neoplasm of upper-outer quadrant of right breast in female, estrogen receptor positive ([place]) Yes    SUMMARY OF ONCOLOGIC HISTORY: Oncology History  Malignant neoplasm of upper-outer quadrant of right breast in female, estrogen receptor positive ([place])  01/05/2016 Initial Biopsy   Right breast upper outer quadrant biopsy: IDC, grade 1, ER+(100%), PR+(30%), Ki-67 10%, HER-2 negative (ratio 0.72).    01/26/2016 Initial Diagnosis   Breast cancer of upper-outer quadrant of right female breast ([place])   02/10/2016 Surgery   Right lumpectomy [name]): IDC, DCIS, 1.7cm, grade 1, margins negative, 3 SLN negative   02/10/2016 Oncotype testing   14/9%, low risk   03/02/2016 Genetic Testing   Patient has genetic testing done for a personal and family history of breast cancer and family history of ovarian cancer. Results revealed patient has the following mutation(s): No deleterious mutations found on the [facility] panel.  The [facility] gene panel offered by [employer] includes sequencing and deletion/duplication testing of the following 27 genes: APC, ATM, BARD1, BMPR1A, BRCA1, BRCA2, BRIP1, CHD1, CDK4, CDKN2A, CHEK2, EPCAM (large rearrangement only), MLH1, MSH2, MSH6, MUTYH, NBN, PALB2, PMS2, PTEN, RAD51C, RAD51D, SMAD4, STK11, and TP53. Sequencing was performed for select regions of POLE  and POLD1, and large rearrangement analysis was performed for select regions of GREM1. The report date is March 02, 2016.   02/2016 -  Anti-estrogen oral therapy   Tamoxifen daily   04/20/2016 - 05/17/2016 Radiation Therapy   Adjuvant radiation [name]): 1. The Right breast was treated to 40.05 Gy in 15 fractions at 2.67 Gy per fraction.  2. The Right breast was boosted to 10 Gy in 5 fractions at 2 Gy per fraction.     CHIEF COMPLIANT: Estrogen receptor positive breast cancer  INTERVAL HISTORY: Julie Mack is a 66 y.o with the above-mentioned currently on Tamoxifen and surveillance. She presents to the clinic for a follow-up and establish oncology care with Dr. Lindi Adie. She does have some joint pains. She has a trainer so she exercise 3 times a week. She also hikes.     ALLERGIES:  is allergic to gluten meal, pollen extract, and reclast [zoledronic acid].  MEDICATIONS:  Current Outpatient Medications  Medication Sig Dispense Refill   betamethasone valerate ointment (VALISONE) 0.1 %      cholecalciferol (VITAMIN D3) 25 MCG (1000 UT) tablet Take 1,000 Units by mouth daily.     fluorouracil (EFUDEX) 5 % cream Apply topically as needed.     ketoconazole (NIZORAL) 2 % cream Apply 1 application topically daily. 15 g 0   valACYclovir (VALTREX) 1000 MG tablet Take 1,000 mg by mouth 2 (two) times daily. 01/08/15 as needed for pain     vitamin C (ASCORBIC ACID) 500 MG tablet Take 500 mg by mouth daily.     tamoxifen (NOLVADEX) 20 MG tablet Take 1 tablet (20 mg total) by mouth daily. 90 tablet 4   venlafaxine XR (EFFEXOR-XR) 37.5 MG 24 hr capsule Take 1 capsule (37.5 mg total) by mouth daily with  breakfast. 90 capsule 4   No current facility-administered medications for this visit.    PHYSICAL EXAMINATION: ECOG PERFORMANCE STATUS: 1 - Symptomatic but completely ambulatory  Vitals:   01/25/22 1408  BP: (!) 141/63  Pulse: 86  Resp: 16  Temp: 97.9 F (36.6 C)  SpO2: 98%   Filed  Weights   01/25/22 1408  Weight: 185 lb 11.2 oz (84.2 kg)    BREAST: No palpable masses or nodules in either right or left breasts. No palpable axillary supraclavicular or infraclavicular adenopathy no breast tenderness or nipple discharge. (exam performed in the presence of a chaperone)  LABORATORY DATA:  I have reviewed the data as listed    Latest Ref Rng & Units 01/26/2021    8:40 AM 01/28/2020   10:18 AM 01/18/2018   10:31 AM  CMP  Glucose 70 - 99 mg/dL 117  105  91   BUN 8 - 23 mg/dL '14  17  13   ' Creatinine 0.44 - 1.00 mg/dL 0.60  0.63  0.65   Sodium 135 - 145 mmol/L 136  141  141   Potassium 3.5 - 5.1 mmol/L 4.3  4.7  4.5   Chloride 98 - 111 mmol/L 106  108  103   CO2 22 - 32 mmol/L '24  27  30   ' Calcium 8.9 - 10.3 mg/dL 8.7  9.3  9.8   Total Protein 6.5 - 8.1 g/dL 7.0  6.6  7.2   Total Bilirubin 0.3 - 1.2 mg/dL 0.5  0.4  0.3   Alkaline Phos 38 - 126 U/L 57  58  46   AST 15 - 41 U/L '22  15  17   ' ALT 0 - 44 U/L '25  19  26     ' Lab Results  Component Value Date   WBC 5.9 01/26/2021   HGB 13.0 01/26/2021   HCT 39.5 01/26/2021   MCV 88.0 01/26/2021   PLT 195 01/26/2021   NEUTROABS 3.6 01/26/2021    ASSESSMENT & PLAN:  Malignant neoplasm of upper-outer quadrant of right breast in female, estrogen receptor positive ([place]) Dr. Jana Hakim patient establish oncology care with me  1.  02/10/2016: Right lumpectomy T1CN0 stage Ia grade 1 IDC margins negative, ER/PR positive HER2 negative Ki-67 10%, Oncotype score 14 (9% risk of recurrence) 2. adjuvant radiation 04/20/2016-05/17/2016 3.  Genetics: Negative however she is felt to be high risk based on family history to require annual breast MRIs  Current treatment: Tamoxifen started 02/04/2016 x 10 years Tamoxifen toxicities: Tolerating tamoxifen fairly well without any major problems or concerns.  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  She went to [place] with her son on multiple  hikes.  Breast cancer surveillance: 1.  Breast exam 01/25/2022: Benign 2. mammogram 01/06/2022: Benign breast density category B Alternating mammogram and MRI every 6 months.  We ordered a MRI to be done in 6 months from now.  Return to clinic in 1 year for follow-up    Orders Placed This Encounter  Procedures   MR BREAST BILATERAL W [place] CAD    Standing Status:   Future    Standing Expiration Date:   01/26/2023    Order Specific Question:   If indicated for the ordered procedure, I authorize the administration of contrast media per Radiology protocol    Answer:   Yes    Order Specific Question:   What is the patient's sedation requirement?  Answer:   No Sedation    Order Specific Question:   Does the patient have a pacemaker or implanted devices?    Answer:   No    Order Specific Question:   Preferred imaging location?    Answer:   [facility] (table limit-550lbs)   The patient has a good understanding of the overall plan. she agrees with it. she will call with any problems that may develop before the next visit here. Total time spent: 30 mins including face to face time and time spent for planning, charting and co-ordination of care   Harriette Ohara, MD 01/25/22    I Gardiner Coins am scribing for Dr. Lindi Adie  I have reviewed the above documentation for accuracy and completeness, and I agree with the above.

## 2022-01-25 ENCOUNTER — Other Ambulatory Visit: Payer: Self-pay

## 2022-01-25 ENCOUNTER — Inpatient Hospital Stay: Payer: Commercial Managed Care - PPO | Attending: Hematology and Oncology | Admitting: Hematology and Oncology

## 2022-01-25 ENCOUNTER — Other Ambulatory Visit: Payer: Self-pay | Admitting: Obstetrics and Gynecology

## 2022-01-25 VITALS — BP 141/63 | HR 86 | Temp 97.9°F | Resp 16 | Ht 63.0 in | Wt 185.7 lb

## 2022-01-25 DIAGNOSIS — Z8041 Family history of malignant neoplasm of ovary: Secondary | ICD-10-CM | POA: Diagnosis not present

## 2022-01-25 DIAGNOSIS — Z79810 Long term (current) use of selective estrogen receptor modulators (SERMs): Secondary | ICD-10-CM | POA: Insufficient documentation

## 2022-01-25 DIAGNOSIS — Z923 Personal history of irradiation: Secondary | ICD-10-CM | POA: Insufficient documentation

## 2022-01-25 DIAGNOSIS — Z803 Family history of malignant neoplasm of breast: Secondary | ICD-10-CM | POA: Diagnosis not present

## 2022-01-25 DIAGNOSIS — Z8249 Family history of ischemic heart disease and other diseases of the circulatory system: Secondary | ICD-10-CM

## 2022-01-25 DIAGNOSIS — C50411 Malignant neoplasm of upper-outer quadrant of right female breast: Secondary | ICD-10-CM | POA: Diagnosis not present

## 2022-01-25 DIAGNOSIS — Z853 Personal history of malignant neoplasm of breast: Secondary | ICD-10-CM | POA: Insufficient documentation

## 2022-01-25 DIAGNOSIS — Z170 Estrogen receptor positive status [ER+]: Secondary | ICD-10-CM | POA: Diagnosis not present

## 2022-01-25 MED ORDER — TAMOXIFEN CITRATE 20 MG PO TABS: 20.0000 mg | ORAL_TABLET | Freq: Every day | ORAL | 4 refills | Status: DC

## 2022-01-25 MED ORDER — VENLAFAXINE HCL ER 37.5 MG PO CP24: 37.5000 mg | ORAL_CAPSULE | Freq: Every day | ORAL | 4 refills | Status: DC

## 2022-01-25 NOTE — Assessment & Plan Note (Addendum)
Dr. Jana Hakim patient establish oncology care with me  1.  02/10/2016: Right lumpectomy T1CN0 stage Ia grade 1 IDC margins negative, ER/PR positive HER2 negative Ki-67 10%, Oncotype score 14 (9% risk of recurrence) 2. adjuvant radiation 04/20/2016-05/17/2016 3.  Genetics: Negative however she is felt to be high risk based on family history to require annual breast MRIs  Current treatment: Tamoxifen started 02/04/2016 x 10 years Tamoxifen toxicities: Tolerating tamoxifen fairly well without any major problems or concerns.  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  She went to [place] with her son on multiple hikes.  Breast cancer surveillance: 1.  Breast exam 01/25/2022: Benign 2. mammogram 01/06/2022: Benign breast density category B  Return to clinic in 1 year for follow-up

## 2022-01-26 ENCOUNTER — Telehealth: Payer: Self-pay | Admitting: Hematology and Oncology

## 2022-01-26 NOTE — Telephone Encounter (Signed)
Scheduled appointment per 10/23 los. Patient is aware.

## 2022-01-27 ENCOUNTER — Ambulatory Visit: Payer: Commercial Managed Care - PPO | Admitting: Hematology and Oncology

## 2022-02-11 ENCOUNTER — Ambulatory Visit
Admission: RE | Admit: 2022-02-11 | Discharge: 2022-02-11 | Disposition: A | Discharge status: Home / Self Care | Payer: No Typology Code available for payment source | Source: Ambulatory Visit | Attending: Obstetrics and Gynecology | Admitting: Obstetrics and Gynecology

## 2022-02-11 DIAGNOSIS — Z8249 Family history of ischemic heart disease and other diseases of the circulatory system: Secondary | ICD-10-CM

## 2022-02-18 ENCOUNTER — Ambulatory Visit: Payer: Commercial Managed Care - PPO | Admitting: Cardiovascular Disease

## 2022-03-19 ENCOUNTER — Ambulatory Visit: Payer: Commercial Managed Care - PPO | Attending: Cardiovascular Disease | Admitting: Cardiovascular Disease

## 2022-03-19 ENCOUNTER — Encounter: Payer: Self-pay | Admitting: Cardiovascular Disease

## 2022-03-19 VITALS — BP 132/78 | HR 81 | Ht 64.0 in | Wt 187.8 lb

## 2022-03-19 DIAGNOSIS — E782 Mixed hyperlipidemia: Secondary | ICD-10-CM

## 2022-03-19 DIAGNOSIS — R931 Abnormal findings on diagnostic imaging of heart and coronary circulation: Secondary | ICD-10-CM | POA: Diagnosis not present

## 2022-03-19 DIAGNOSIS — E785 Hyperlipidemia, unspecified: Secondary | ICD-10-CM | POA: Insufficient documentation

## 2022-03-19 DIAGNOSIS — Z8249 Family history of ischemic heart disease and other diseases of the circulatory system: Secondary | ICD-10-CM | POA: Insufficient documentation

## 2022-03-19 NOTE — Assessment & Plan Note (Signed)
Mild hyperlipidemia with total cholesterol of 195, LDL 121 HDL 51 measured on 11/22/2021.  Since that time she was started on low-dose rosuvastatin by her PCP, Dr. Brigitte Pulse, because of a mildly elevated coronary calcium score.  Her LDL target for secondary prevention is less than 70.  Dr. Manuella Ghazi will be following this up in the future.

## 2022-03-19 NOTE — Progress Notes (Signed)
03/19/2022 Julie Mack   [birth year]  [id number]  Primary Physician Brigitte Pulse Emily Filbert., MD Primary Cardiologist: Lorretta Harp MD Lupe Carney, [place]  HPI:  Julie Mack is a 66 y.o. mildly overweight married Caucasian female mother of 2 children who was in the hotel business.  She was referred by Dr. Arvella Nigh, her OB/GYN, for cardiovascular valuation because of mildly elevated coronary calcium score.  Her cardiac risk factor profile is notable for mild hyperlipidemia and family history.  Her mother did had CABG.  She has had breast cancer and melanoma in the past.  She is very active and works out with a trainer 3 times a week, walks and hikes.  Her most recent lipid profile revealed total cholesterol 195, LDL of 121 HDL 51 performed 12/02/2021.  She did have a coronary calcium score ordered by her OB/GYN on 02/11/2022 which was 106 majority of which was in the LAD and circumflex.  She is completely asymptomatic.  Since that time she has been placed on a low-dose statin drug by her PCP.   Current Meds  Medication Sig   betamethasone valerate ointment (VALISONE) 0.1 %    cholecalciferol (VITAMIN D3) 25 MCG (1000 UT) tablet Take 1,000 Units by mouth daily.   fluorouracil (EFUDEX) 5 % cream Apply topically as needed.   ketoconazole (NIZORAL) 2 % cream Apply 1 application topically daily.   rosuvastatin (CRESTOR) 10 MG tablet 1 tablet Orally Once a day for 90 days   tamoxifen (NOLVADEX) 20 MG tablet Take 1 tablet (20 mg total) by mouth daily.   valACYclovir (VALTREX) 1000 MG tablet Take 1,000 mg by mouth 2 (two) times daily. 01/08/15 as needed for pain   venlafaxine XR (EFFEXOR-XR) 37.5 MG 24 hr capsule Take 1 capsule (37.5 mg total) by mouth daily with breakfast.     Allergies  Allergen Reactions   Gluten Meal Nausea Only   Pollen Extract Itching   Reclast [Zoledronic Acid] Other (See Comments)    Social History   Socioeconomic History   Marital status:  Married    Spouse name: Simona Huh   Number of children: 2   Years of education: 16   Highest education level: Not on file  Occupational History    Comment: Statistician, hotels  Tobacco Use   Smoking status: Never   Smokeless tobacco: Never  Substance and Sexual Activity   Alcohol use: Yes    Alcohol/week: 0.0 standard drinks of alcohol    Comment: 2 glasses wine a day   Drug use: No   Sexual activity: Yes    Birth control/protection: Post-menopausal  Other Topics Concern   Not on file  Social History Narrative   Lives at home with husband   decaff only   Social Determinants of Health   Financial Resource Strain: Not on file  Food Insecurity: Not on file  Transportation Needs: Not on file  Physical Activity: Not on file  Stress: Not on file  Social Connections: Not on file  Intimate Partner Violence: Not on file     Review of Systems: General: negative for chills, fever, night sweats or weight changes.  Cardiovascular: negative for chest pain, dyspnea on exertion, edema, orthopnea, palpitations, paroxysmal nocturnal dyspnea or shortness of breath Dermatological: negative for rash Respiratory: negative for cough or wheezing Urologic: negative for hematuria Abdominal: negative for nausea, vomiting, diarrhea, bright red blood per rectum, melena, or hematemesis Neurologic: negative for visual changes, syncope, or dizziness All  other systems reviewed and are otherwise negative except as noted above.    Blood pressure 132/78, pulse 81, height '5\' 4"'$  (1.626 m), weight 187 lb 12.8 oz (85.2 kg), last menstrual period 05/04/2011, SpO2 96 %.  General appearance: alert and no distress Neck: no adenopathy, no carotid bruit, no JVD, supple, symmetrical, trachea midline, and thyroid not enlarged, symmetric, no tenderness/mass/nodules Lungs: clear to auscultation bilaterally Heart: regular rate and rhythm, S1, S2 normal, no murmur, click, rub or gallop Extremities:  extremities normal, atraumatic, no cyanosis or edema Pulses: 2+ and symmetric Skin: Skin color, texture, turgor normal. No rashes or lesions Neurologic: Grossly normal  EKG sinus rhythm at 81 without ST or T wave changes.  I personally reviewed this EKG.  ASSESSMENT AND PLAN:   Hyperlipidemia Mild hyperlipidemia with total cholesterol of 195, LDL 121 HDL 51 measured on 11/22/2021.  Since that time she was started on low-dose rosuvastatin by her PCP, Dr. Brigitte Pulse, because of a mildly elevated coronary calcium score.  Her LDL target for secondary prevention is less than 70.  Dr. Manuella Ghazi will be following this up in the future.  Family history of heart disease Mother had CABG.  Elevated coronary artery calcium score Coronary calcium score was 106 principally in the LAD and circumflex measured 02/11/2022.  Based on this, and because of family history and mildly elevated LDL she was started on low-dose rosuvastatin which will be followed by her PCP.     Lorretta Harp MD FACP,FACC,FAHA, [facility] 03/19/2022 9:26 AM

## 2022-03-19 NOTE — Assessment & Plan Note (Signed)
Mother had CABG.

## 2022-03-19 NOTE — Patient Instructions (Signed)
Medication Instructions:  Your physician recommends that you continue on your current medications as directed. Please refer to the Current Medication list given to you today.  *If you need a refill on your cardiac medications before your next appointment, please call your pharmacy*   Follow-Up: At [facility], you and your health needs are our priority.  As part of our continuing mission to provide you with exceptional heart care, we have created designated Provider Care Teams.  These Care Teams include your primary Cardiologist (physician) and Advanced Practice Providers (APPs -  Physician Assistants and Nurse Practitioners) who all work together to provide you with the care you need, when you need it.  We recommend signing up for the patient portal called "MyChart".  Sign up information is provided on this After Visit Summary.  MyChart is used to connect with patients for Virtual Visits (Telemedicine).  Patients are able to view lab/test results, encounter notes, upcoming appointments, etc.  Non-urgent messages can be sent to your provider as well.   To learn more about what you can do with MyChart, go to [email or web address].    Your next appointment:   We will see you on an as needed basis.  Provider:   Jonathan Berry, MD  

## 2022-03-19 NOTE — Assessment & Plan Note (Signed)
Coronary calcium score was 106 principally in the LAD and circumflex measured 02/11/2022.  Based on this, and because of family history and mildly elevated LDL she was started on low-dose rosuvastatin which will be followed by her PCP.

## 2022-04-03 IMAGING — MG DIGITAL DIAGNOSTIC BILAT W/ TOMO W/ CAD
6 of 9 series · 6 of 25 positions shown · non-contrast
Comparison: Previous exam(s).

CLINICAL DATA: 64-year-old female status post malignant right
lumpectomy with radiation therapy in 1058.

EXAM:
DIGITAL DIAGNOSTIC BILATERAL MAMMOGRAM WITH TOMOSYNTHESIS AND CAD
TECHNIQUE: Bilateral digital diagnostic mammography and breast tomosynthesis
was performed. The images were evaluated with computer-aided
detection.

[R MLO]
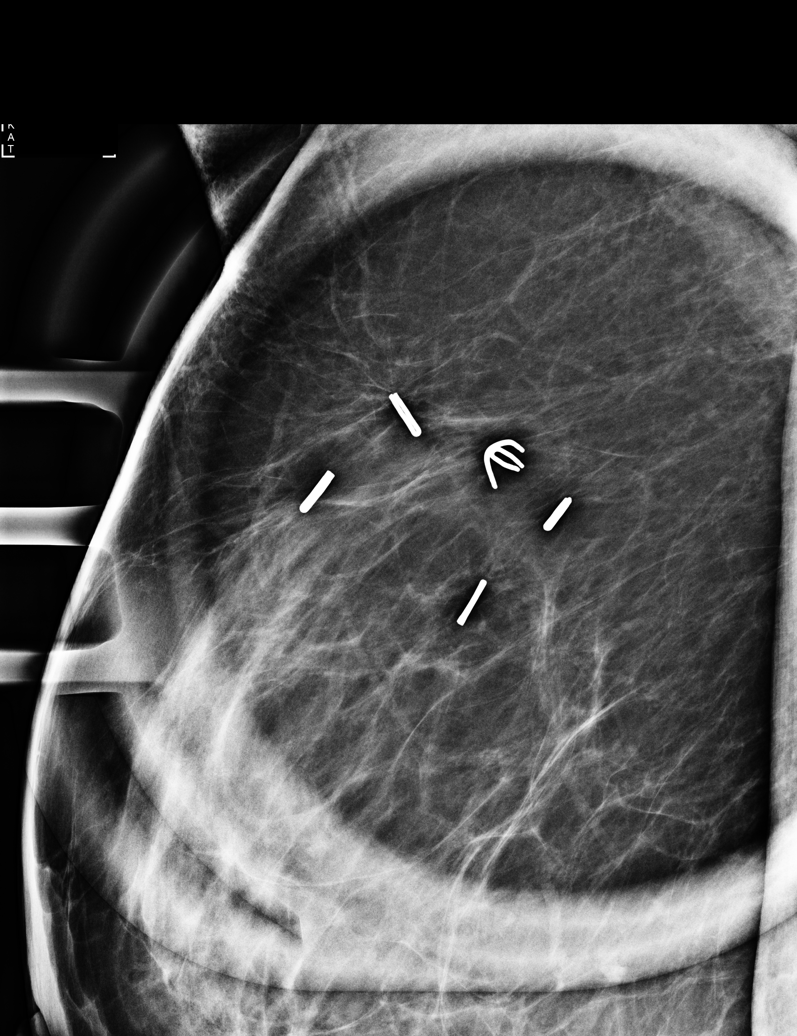

[R MLO synth-2D]
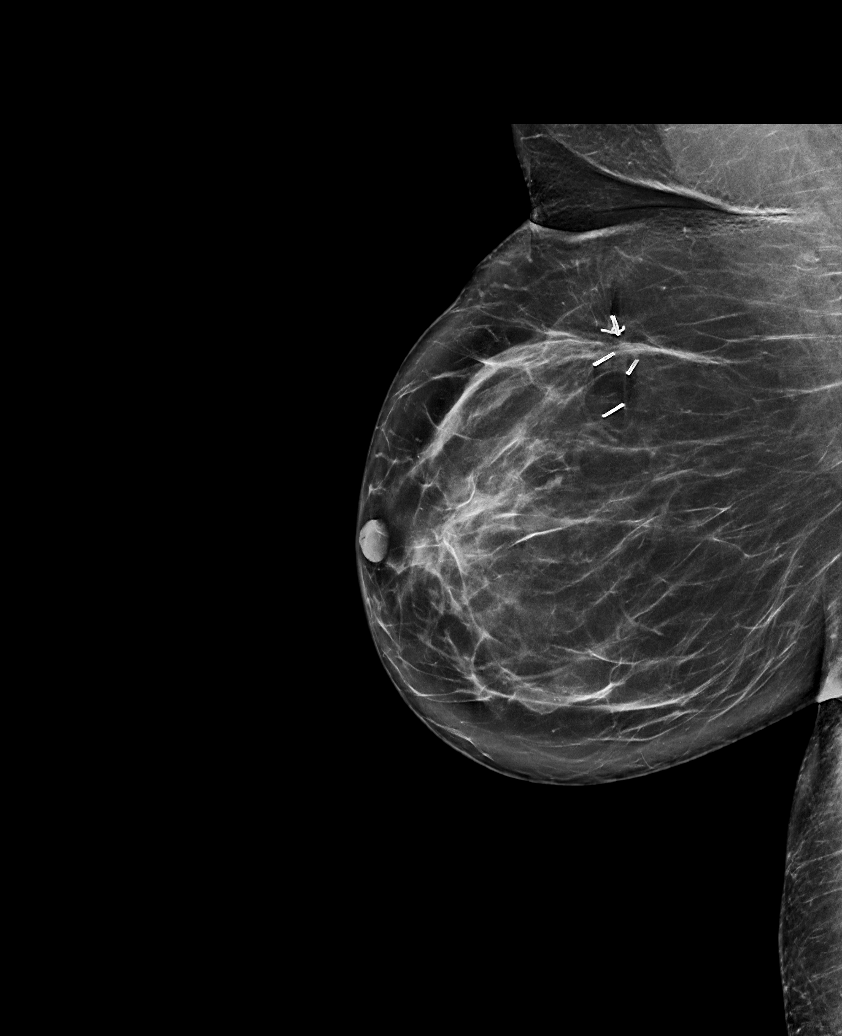

[R CC synth-2D]
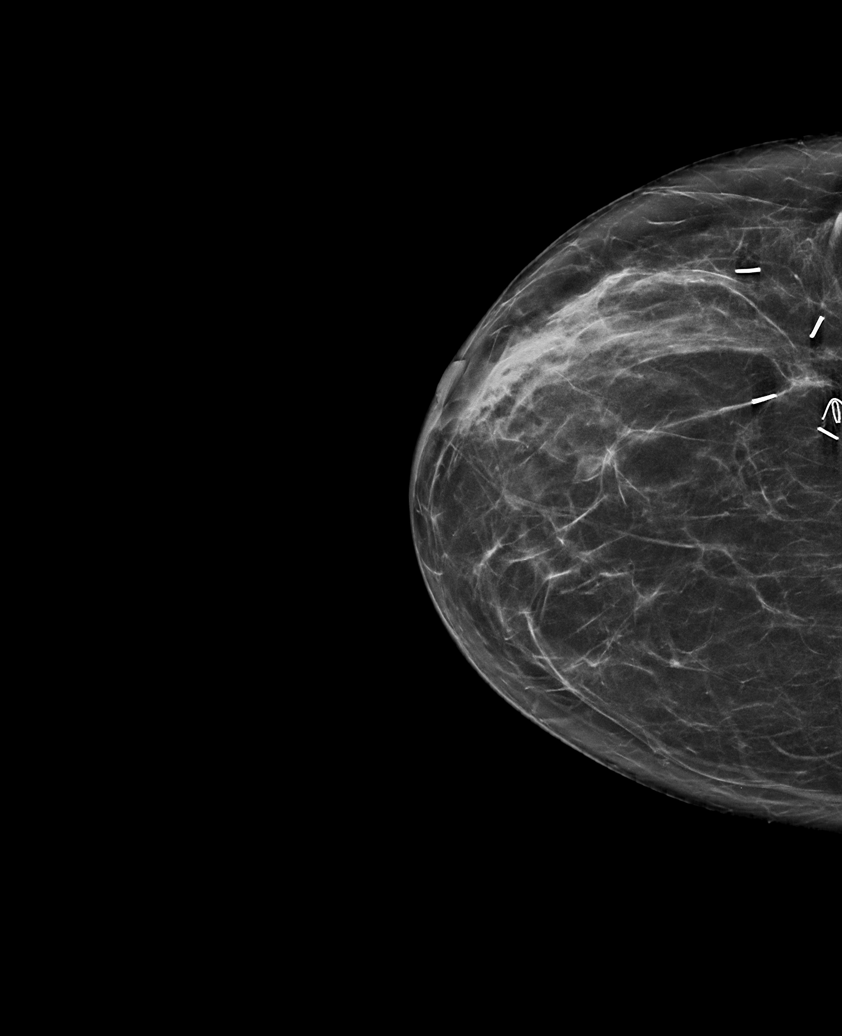

[L CC synth-2D]
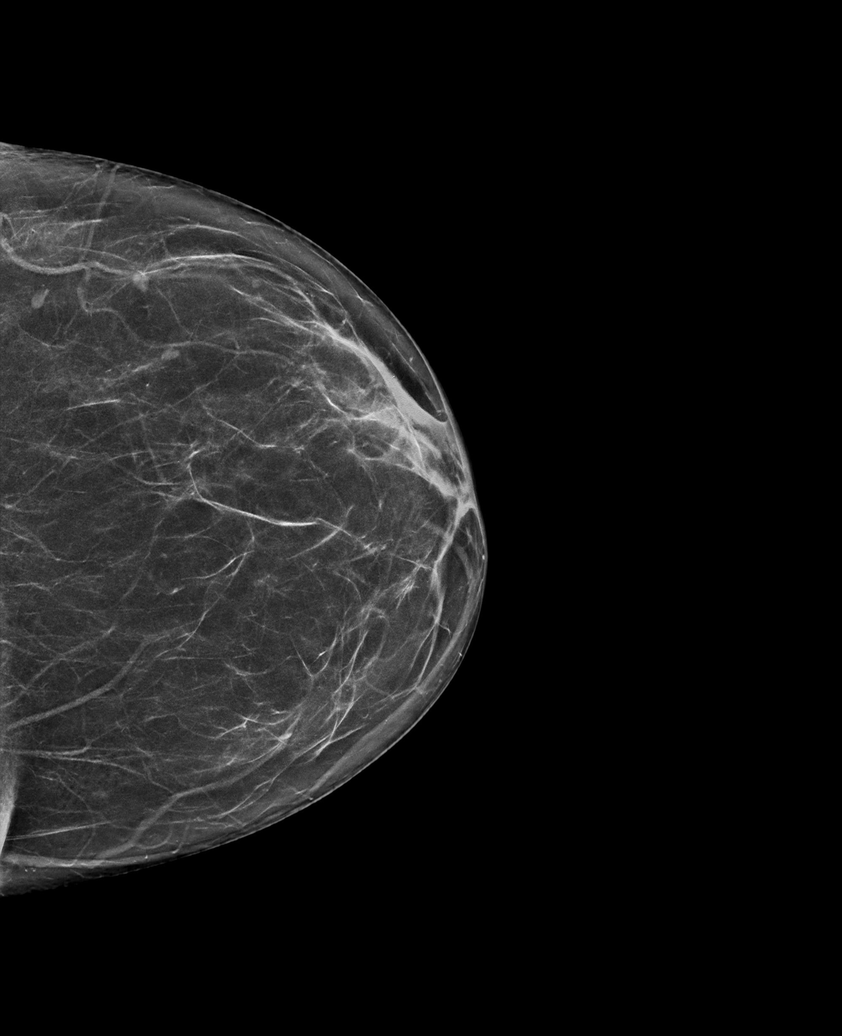

[L MLO synth-2D]
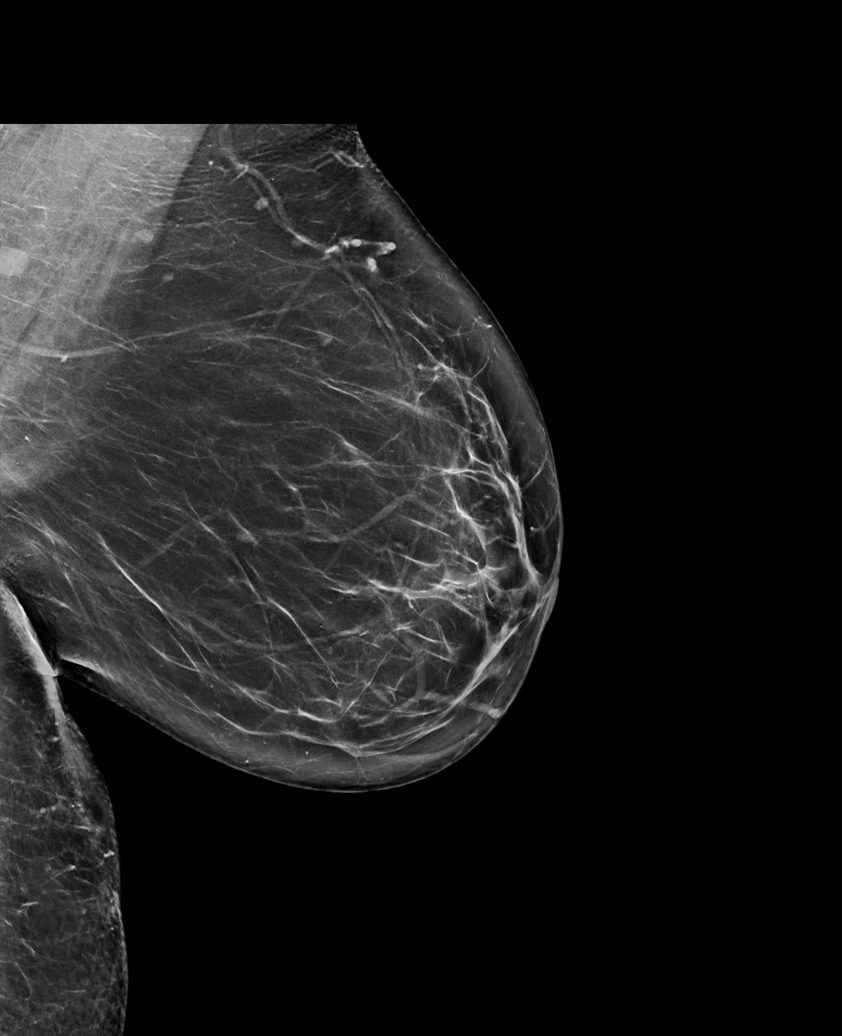

[R CC tomo · tomo slice 33/66.0]
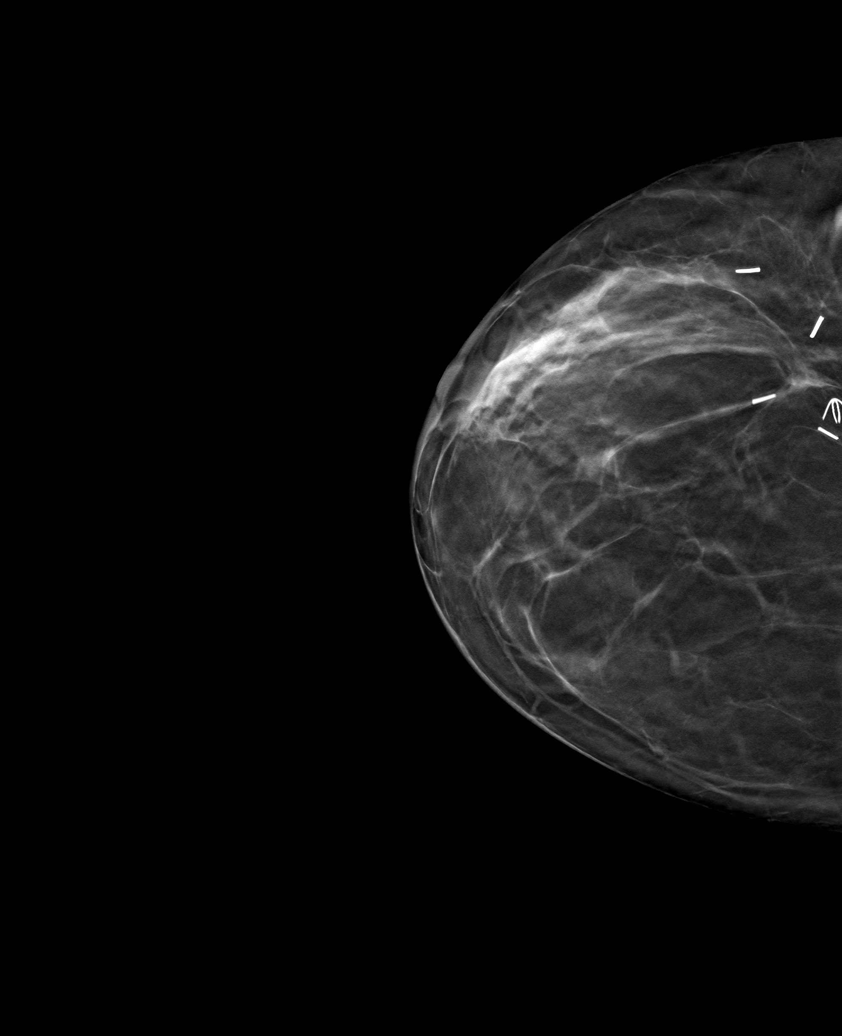

[6 of 25 positions shown; findings below may reference images not displayed]

ACR Breast Density Category c: The breast tissue is heterogeneously
dense, which may obscure small masses.
FINDINGS: Stable post lumpectomy changes in the upper outer right breast
posteriorly. No new or suspicious findings in either breast. The
parenchymal pattern is stable.
IMPRESSION: 1. No mammographic evidence of malignancy in either breast.
2. Stable right breast posttreatment changes.

RECOMMENDATION:
Screening mammogram in one year.(Code:IF-[id number])

I have discussed the findings and recommendations with the patient.
If applicable, a reminder letter will be sent to the patient
regarding the next appointment.

BI-RADS CATEGORY  2: Benign.

## 2022-07-26 ENCOUNTER — Other Ambulatory Visit: Payer: Commercial Managed Care - PPO

## 2022-08-04 ENCOUNTER — Ambulatory Visit
Admission: RE | Admit: 2022-08-04 | Discharge: 2022-08-04 | Disposition: A | Discharge status: Home / Self Care | Payer: Commercial Managed Care - PPO | Source: Ambulatory Visit | Attending: Hematology and Oncology | Admitting: Hematology and Oncology

## 2022-08-04 DIAGNOSIS — C50411 Malignant neoplasm of upper-outer quadrant of right female breast: Secondary | ICD-10-CM

## 2022-08-04 MED ORDER — GADOPICLENOL 0.5 MMOL/ML IV SOLN
9.0000 mL | Freq: Once | INTRAVENOUS | Status: AC | PRN
  Administered 2022-08-04: 9 mL via INTRAVENOUS

## 2022-08-05 ENCOUNTER — Other Ambulatory Visit: Payer: Self-pay

## 2022-08-05 ENCOUNTER — Other Ambulatory Visit: Payer: Self-pay | Admitting: Hematology and Oncology

## 2022-08-05 DIAGNOSIS — C50411 Malignant neoplasm of upper-outer quadrant of right female breast: Secondary | ICD-10-CM

## 2022-08-05 DIAGNOSIS — M1990 Unspecified osteoarthritis, unspecified site: Secondary | ICD-10-CM | POA: Insufficient documentation

## 2022-08-05 DIAGNOSIS — M810 Age-related osteoporosis without current pathological fracture: Secondary | ICD-10-CM | POA: Insufficient documentation

## 2022-08-05 NOTE — Progress Notes (Signed)
MRI of the breast revealed right chest wall rib abnormality.  Radiology recommended a CT chest and therefore we will obtain that and I will call her with the results.

## 2022-08-05 NOTE — Progress Notes (Signed)
Inbasket message received from GI with below statement:   "The above patient had an MRI on 08/04/22 and below are the recommendations of Dr. Sande Brothers.    RECOMMENDATION:  Contrast enhanced CT of the chest for further evaluation of the anterolateral right chest wall. Annual bilateral mammography due in October 2024.    Please review the recommendations with the patient and enter any orders necessary."  CT Chest with contrast ordered, message sent to PA team, called Pt with plan who verbalized understanding. MD to order MM during MD visit.

## 2022-08-10 ENCOUNTER — Telehealth: Payer: Self-pay

## 2022-08-10 NOTE — Telephone Encounter (Signed)
Called pt to schedule CT chest and MD visit. She will go to [facility] [address] 5/13 at 2:20 for CT and see MD 5/17 at 0930. She is in agreement with this.

## 2022-08-16 ENCOUNTER — Ambulatory Visit
Admission: RE | Admit: 2022-08-16 | Discharge: 2022-08-16 | Disposition: A | Discharge status: Home / Self Care | Payer: Commercial Managed Care - PPO | Source: Ambulatory Visit | Attending: Hematology and Oncology | Admitting: Hematology and Oncology

## 2022-08-16 DIAGNOSIS — Z170 Estrogen receptor positive status [ER+]: Secondary | ICD-10-CM

## 2022-08-16 MED ORDER — IOPAMIDOL (ISOVUE-300) INJECTION 61%
75.0000 mL | Freq: Once | INTRAVENOUS | Status: AC | PRN
  Administered 2022-08-16: 75 mL via INTRAVENOUS

## 2022-08-18 NOTE — Progress Notes (Signed)
Patient Care Team: Serena Croissant, MD as PCP - General (Hematology and Oncology) Claud Kelp, MD as Consulting Physician (General Surgery) Lonie Peak, MD as Attending Physician (Radiation Oncology) Venancio Poisson, MD as Consulting Physician (Dermatology) Bernette Redbird, MD as Consulting Physician (Gastroenterology) Richardean Chimera, MD as Consulting Physician (Obstetrics and Gynecology) Axel Filler Larna Daughters, NP as Nurse Practitioner (Hematology and Oncology)  DIAGNOSIS:  Encounter Diagnosis  Name Primary?   Malignant neoplasm of upper-outer quadrant of right breast in female, estrogen receptor positive (HCC) Yes    SUMMARY OF ONCOLOGIC HISTORY: Oncology History  Malignant neoplasm of upper-outer quadrant of right breast in female, estrogen receptor positive (HCC)  01/05/2016 Initial Biopsy   Right breast upper outer quadrant biopsy: IDC, grade 1, ER+(100%), PR+(30%), Ki-67 10%, HER-2 negative (ratio 0.72).    01/26/2016 Initial Diagnosis   Breast cancer of upper-outer quadrant of right female breast (HCC)   02/10/2016 Surgery   Right lumpectomy Derrell Lolling): IDC, DCIS, 1.7cm, grade 1, margins negative, 3 SLN negative   02/10/2016 Oncotype testing   14/9%, low risk   03/02/2016 Genetic Testing   Patient has genetic testing done for a personal and family history of breast cancer and family history of ovarian cancer. Results revealed patient has the following mutation(s): No deleterious mutations found on the [facility] panel.  The [facility] gene panel offered by [employer] includes sequencing and deletion/duplication testing of the following 27 genes: APC, ATM, BARD1, BMPR1A, BRCA1, BRCA2, BRIP1, CHD1, CDK4, CDKN2A, CHEK2, EPCAM (large rearrangement only), MLH1, MSH2, MSH6, MUTYH, NBN, PALB2, PMS2, PTEN, RAD51C, RAD51D, SMAD4, STK11, and TP53. Sequencing was performed for select regions of POLE and POLD1, and large rearrangement analysis was performed for select  regions of GREM1. The report date is March 02, 2016.   02/2016 -  Anti-estrogen oral therapy   Tamoxifen daily   04/20/2016 - 05/17/2016 Radiation Therapy   Adjuvant radiation Basilio Cairo): 1. The Right breast was treated to 40.05 Gy in 15 fractions at 2.67 Gy per fraction.  2. The Right breast was boosted to 10 Gy in 5 fractions at 2 Gy per fraction.     CHIEF COMPLIANT: Review scans/ Surveillance   INTERVAL HISTORY: Julie Mack is a 67 y.o with the above-mentioned currently on Tamoxifen and surveillance. She presents to the clinic for a follow-up. She is still staying active, working with a Psychologist, educational at [employer] She also does some hiking and walking when she can.  She went to [place] about a month ago and possibly had a fall with a 4 broken rib.  This has shown up on the breast MRI and subsequently proved to be benign healing fracture on the CT scan.  She is here today to discuss results of the CT scan.   ALLERGIES:  is allergic to gluten meal and reclast [zoledronic acid].  MEDICATIONS:  Current Outpatient Medications  Medication Sig Dispense Refill   betamethasone valerate ointment (VALISONE) 0.1 %      cholecalciferol (VITAMIN D3) 25 MCG (1000 UT) tablet Take 1,000 Units by mouth daily.     fluorouracil (EFUDEX) 5 % cream Apply topically as needed.     ketoconazole (NIZORAL) 2 % cream Apply 1 application topically daily. 15 g 0   rosuvastatin (CRESTOR) 10 MG tablet 1 tablet Orally Once a day for 90 days     tamoxifen (NOLVADEX) 20 MG tablet Take 1 tablet (20 mg total) by mouth daily. 90 tablet 4   valACYclovir (VALTREX) 1000 MG tablet Take 1,000 mg  by mouth 2 (two) times daily. 01/08/15 as needed for pain     venlafaxine XR (EFFEXOR-XR) 37.5 MG 24 hr capsule Take 1 capsule (37.5 mg total) by mouth daily with breakfast. 90 capsule 4   No current facility-administered medications for this visit.    PHYSICAL EXAMINATION: ECOG PERFORMANCE STATUS: 1 - Symptomatic but completely  ambulatory  Vitals:   08/20/22 0945  BP: 115/68  Pulse: 99  Resp: 17  Temp: (!) 97.5 F (36.4 C)  SpO2: 94%   Filed Weights   08/20/22 0945  Weight: 188 lb 12.8 oz (85.6 kg)      LABORATORY DATA:  I have reviewed the data as listed    Latest Ref Rng & Units 01/26/2021    8:40 AM 01/28/2020   10:18 AM 01/18/2018   10:31 AM  CMP  Glucose 70 - 99 mg/dL 409  811  91   BUN 8 - 23 mg/dL 14  17  13    Creatinine 0.44 - 1.00 mg/dL 9.14  7.82  9.56   Sodium 135 - 145 mmol/L 136  141  141   Potassium 3.5 - 5.1 mmol/L 4.3  4.7  4.5   Chloride 98 - 111 mmol/L 106  108  103   CO2 22 - 32 mmol/L 24  27  30    Calcium 8.9 - 10.3 mg/dL 8.7  9.3  9.8   Total Protein 6.5 - 8.1 g/dL 7.0  6.6  7.2   Total Bilirubin 0.3 - 1.2 mg/dL 0.5  0.4  0.3   Alkaline Phos 38 - 126 U/L 57  58  46   AST 15 - 41 U/L 22  15  17    ALT 0 - 44 U/L 25  19  26      Lab Results  Component Value Date   WBC 5.9 01/26/2021   HGB 13.0 01/26/2021   HCT 39.5 01/26/2021   MCV 88.0 01/26/2021   PLT 195 01/26/2021   NEUTROABS 3.6 01/26/2021    ASSESSMENT & PLAN:  Malignant neoplasm of upper-outer quadrant of right breast in female, estrogen receptor positive (HCC) 1.  02/10/2016: Right lumpectomy T1CN0 stage Ia grade 1 IDC margins negative, ER/PR positive HER2 negative Ki-67 10%, Oncotype score 14 (9% risk of recurrence) 2. adjuvant radiation 04/20/2016-05/17/2016 3.  Genetics: Negative however she is felt to be high risk based on family history to require annual breast MRIs   Current treatment: Tamoxifen started 02/04/2016 x 10 years Tamoxifen toxicities: Tolerating tamoxifen fairly well without any major problems or concerns.   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  She went to [place] with her son on multiple hikes.   Breast cancer surveillance: 1.  Breast exam 01/25/2022: Benign 2. mammogram 01/06/2022: Benign breast density category B 3.  Breast MRI 08/04/2022: Right  chest wall rib abnormality 4.  CT chest 08/19/2022: The right chest wall enhancement noted on MRI corresponds to a healing nondisplaced fracture of the right fourth rib.  No findings suggestive of metastatic disease.  Hepatic steatosis.  Radiology review: I discussed with the patient the result of the CT scan not showing any evidence of metastatic disease.  There is no further workup necessary for the healing fracture of the right fourth rib.  Return to clinic in 1 year as previously scheduled.   Return to clinic in 1 year for follow-up    Orders Placed This Encounter  Procedures   MR BREAST BILATERAL  W WO CONTRAST INC CAD    Standing Status:   Future    Standing Expiration Date:   08/20/2023    Order Specific Question:   If indicated for the ordered procedure, I authorize the administration of contrast media per Radiology protocol    Answer:   Yes    Order Specific Question:   What is the patient's sedation requirement?    Answer:   No Sedation    Order Specific Question:   Does the patient have a pacemaker or implanted devices?    Answer:   No    Order Specific Question:   Preferred imaging location?    Answer:   [facility] (table limit-550lbs)   The patient has a good understanding of the overall plan. she agrees with it. she will call with any problems that may develop before the next visit here. Total time spent: 30 mins including face to face time and time spent for planning, charting and co-ordination of care   Tamsen Meek, MD 08/20/22    I Janan Ridge am acting as a Neurosurgeon for [employer]  I have reviewed the above documentation for accuracy and completeness, and I agree with the above.

## 2022-08-20 ENCOUNTER — Inpatient Hospital Stay: Payer: Commercial Managed Care - PPO | Attending: Hematology and Oncology | Admitting: Hematology and Oncology

## 2022-08-20 ENCOUNTER — Other Ambulatory Visit: Payer: Self-pay

## 2022-08-20 VITALS — BP 115/68 | HR 99 | Temp 97.5°F | Resp 17 | Wt 188.8 lb

## 2022-08-20 DIAGNOSIS — Z79899 Other long term (current) drug therapy: Secondary | ICD-10-CM | POA: Insufficient documentation

## 2022-08-20 DIAGNOSIS — Z79810 Long term (current) use of selective estrogen receptor modulators (SERMs): Secondary | ICD-10-CM | POA: Diagnosis not present

## 2022-08-20 DIAGNOSIS — Z923 Personal history of irradiation: Secondary | ICD-10-CM | POA: Insufficient documentation

## 2022-08-20 DIAGNOSIS — C50411 Malignant neoplasm of upper-outer quadrant of right female breast: Secondary | ICD-10-CM | POA: Diagnosis present

## 2022-08-20 DIAGNOSIS — Z170 Estrogen receptor positive status [ER+]: Secondary | ICD-10-CM | POA: Insufficient documentation

## 2022-08-20 NOTE — Assessment & Plan Note (Signed)
1.  02/10/2016: Right lumpectomy T1CN0 stage Ia grade 1 IDC margins negative, ER/PR positive HER2 negative Ki-67 10%, Oncotype score 14 (9% risk of recurrence) 2. adjuvant radiation 04/20/2016-05/17/2016 3.  Genetics: Negative however she is felt to be high risk based on family history to require annual breast MRIs   Current treatment: Tamoxifen started 02/04/2016 x 10 years Tamoxifen toxicities: Tolerating tamoxifen fairly well without any major problems or concerns.   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  She went to [place] with her son on multiple hikes.   Breast cancer surveillance: 1.  Breast exam 01/25/2022: Benign 2. mammogram 01/06/2022: Benign breast density category B 3.  Breast MRI 08/04/2022: Right chest wall rib abnormality 4.  CT chest 08/19/2022: The right chest wall enhancement noted on MRI corresponds to a healing nondisplaced fracture of the right fourth rib.  No findings suggestive of metastatic disease.  Hepatic steatosis.  Radiology review: I discussed with the patient the result of the CT scan not showing any evidence of metastatic disease.  There is no further workup necessary for the healing fracture of the right fourth rib.  Return to clinic in 1 year as previously scheduled.   Return to clinic in 1 year for follow-up

## 2022-11-22 ENCOUNTER — Other Ambulatory Visit: Payer: Self-pay | Admitting: Internal Medicine

## 2022-11-22 DIAGNOSIS — Z1231 Encounter for screening mammogram for malignant neoplasm of breast: Secondary | ICD-10-CM

## 2023-01-10 ENCOUNTER — Ambulatory Visit
Admission: RE | Admit: 2023-01-10 | Discharge: 2023-01-10 | Disposition: A | Discharge status: Home / Self Care | Payer: Commercial Managed Care - PPO | Source: Ambulatory Visit | Attending: Internal Medicine | Admitting: Internal Medicine

## 2023-01-10 DIAGNOSIS — Z1231 Encounter for screening mammogram for malignant neoplasm of breast: Secondary | ICD-10-CM

## 2023-01-11 ENCOUNTER — Telehealth: Payer: Self-pay | Admitting: Hematology and Oncology

## 2023-01-11 NOTE — Telephone Encounter (Signed)
Rescheduled appointment per provider PAL. Patient is aware of the changes made to her upcoming appointment. 

## 2023-01-27 ENCOUNTER — Inpatient Hospital Stay: Payer: Commercial Managed Care - PPO | Admitting: Hematology and Oncology

## 2023-02-03 ENCOUNTER — Other Ambulatory Visit (HOSPITAL_COMMUNITY): Payer: Self-pay | Admitting: Obstetrics and Gynecology

## 2023-02-03 DIAGNOSIS — R011 Cardiac murmur, unspecified: Secondary | ICD-10-CM

## 2023-02-09 ENCOUNTER — Inpatient Hospital Stay: Payer: Commercial Managed Care - PPO | Attending: Hematology and Oncology | Admitting: Hematology and Oncology

## 2023-02-09 VITALS — BP 180/91 | HR 96 | Temp 97.6°F | Resp 18 | Ht 64.0 in | Wt 187.1 lb

## 2023-02-09 DIAGNOSIS — M2550 Pain in unspecified joint: Secondary | ICD-10-CM | POA: Insufficient documentation

## 2023-02-09 DIAGNOSIS — K760 Fatty (change of) liver, not elsewhere classified: Secondary | ICD-10-CM | POA: Diagnosis not present

## 2023-02-09 DIAGNOSIS — Z8041 Family history of malignant neoplasm of ovary: Secondary | ICD-10-CM | POA: Insufficient documentation

## 2023-02-09 DIAGNOSIS — Z170 Estrogen receptor positive status [ER+]: Secondary | ICD-10-CM | POA: Insufficient documentation

## 2023-02-09 DIAGNOSIS — Z79899 Other long term (current) drug therapy: Secondary | ICD-10-CM | POA: Diagnosis not present

## 2023-02-09 DIAGNOSIS — R232 Flushing: Secondary | ICD-10-CM | POA: Insufficient documentation

## 2023-02-09 DIAGNOSIS — Z803 Family history of malignant neoplasm of breast: Secondary | ICD-10-CM | POA: Diagnosis not present

## 2023-02-09 DIAGNOSIS — Z79624 Long term (current) use of inhibitors of nucleotide synthesis: Secondary | ICD-10-CM | POA: Diagnosis not present

## 2023-02-09 DIAGNOSIS — C50411 Malignant neoplasm of upper-outer quadrant of right female breast: Secondary | ICD-10-CM | POA: Diagnosis not present

## 2023-02-09 DIAGNOSIS — Z79810 Long term (current) use of selective estrogen receptor modulators (SERMs): Secondary | ICD-10-CM | POA: Insufficient documentation

## 2023-02-09 MED ORDER — VENLAFAXINE HCL ER 37.5 MG PO CP24: 37.5000 mg | ORAL_CAPSULE | Freq: Every day | ORAL | 4 refills | Status: DC

## 2023-02-09 MED ORDER — TAMOXIFEN CITRATE 20 MG PO TABS: 20.0000 mg | ORAL_TABLET | Freq: Every day | ORAL | 4 refills | Status: DC

## 2023-02-09 NOTE — Assessment & Plan Note (Signed)
1.  02/10/2016: Right lumpectomy T1CN0 stage Ia grade 1 IDC margins negative, ER/PR positive HER2 negative Ki-67 10%, Oncotype score 14 (9% risk of recurrence) 2. adjuvant radiation 04/20/2016-05/17/2016 3.  Genetics: Negative however she is felt to be high risk based on family history to require annual breast MRIs   Current treatment: Tamoxifen started 02/04/2016 x 10 years Tamoxifen toxicities: Tolerating tamoxifen fairly well without any major problems or concerns.   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  She went to [place] with her son on multiple hikes.   Breast cancer surveillance: 1.  Breast exam 02/09/2023: Benign 2. mammogram 01/12/2023: Benign breast density category B 3.  Breast MRI 08/04/2022: Right chest wall rib abnormality 4.  CT chest 08/19/2022: The right chest wall enhancement noted on MRI corresponds to a healing nondisplaced fracture of the right fourth rib.  No findings suggestive of metastatic disease.  Hepatic steatosis.  Return to clinic in 1 year for follow-up

## 2023-02-10 NOTE — Progress Notes (Signed)
Patient Care Team: Cleatis Polka., MD as PCP - General (Internal Medicine) Claud Kelp, MD as Consulting Physician (General Surgery) Lonie Peak, MD as Attending Physician (Radiation Oncology) Venancio Poisson, MD as Consulting Physician (Dermatology) Bernette Redbird, MD as Consulting Physician (Gastroenterology) Richardean Chimera, MD as Consulting Physician (Obstetrics and Gynecology) Axel Filler Larna Daughters, NP as Nurse Practitioner (Hematology and Oncology)  DIAGNOSIS:  Encounter Diagnosis  Name Primary?   Malignant neoplasm of upper-outer quadrant of right breast in female, estrogen receptor positive (HCC) Yes    SUMMARY OF ONCOLOGIC HISTORY: Oncology History  Malignant neoplasm of upper-outer quadrant of right breast in female, estrogen receptor positive (HCC)  01/05/2016 Initial Biopsy   Right breast upper outer quadrant biopsy: IDC, grade 1, ER+(100%), PR+(30%), Ki-67 10%, HER-2 negative (ratio 0.72).    01/26/2016 Initial Diagnosis   Breast cancer of upper-outer quadrant of right female breast (HCC)   02/10/2016 Surgery   Right lumpectomy Derrell Lolling): IDC, DCIS, 1.7cm, grade 1, margins negative, 3 SLN negative   02/10/2016 Oncotype testing   14/9%, low risk   03/02/2016 Genetic Testing   Patient has genetic testing done for a personal and family history of breast cancer and family history of ovarian cancer. Results revealed patient has the following mutation(s): No deleterious mutations found on the [facility] panel.  The [facility] gene panel offered by [employer] includes sequencing and deletion/duplication testing of the following 27 genes: APC, ATM, BARD1, BMPR1A, BRCA1, BRCA2, BRIP1, CHD1, CDK4, CDKN2A, CHEK2, EPCAM (large rearrangement only), MLH1, MSH2, MSH6, MUTYH, NBN, PALB2, PMS2, PTEN, RAD51C, RAD51D, SMAD4, STK11, and TP53. Sequencing was performed for select regions of POLE and POLD1, and large rearrangement analysis was performed for select  regions of GREM1. The report date is March 02, 2016.   02/2016 -  Anti-estrogen oral therapy   Tamoxifen daily   04/20/2016 - 05/17/2016 Radiation Therapy   Adjuvant radiation Basilio Cairo): 1. The Right breast was treated to 40.05 Gy in 15 fractions at 2.67 Gy per fraction.  2. The Right breast was boosted to 10 Gy in 5 fractions at 2 Gy per fraction.     CHIEF COMPLIANT:   HISTORY OF PRESENT ILLNESS: Discussed the use of AI scribe software for clinical note transcription with the patient, who gave verbal consent to proceed.  History of Present Illness   The patient, with a history of breast cancer, presents for her annual follow-up. She has been on tamoxifen for the past seven years without any known side effects. She reports mild to moderate hot flashes, which do not affect her ability to hike and be active. She wonders if her occasional stiffness and achiness could be related to the medication. She also takes venlafaxine, which helps with the hot flashes. The patient is active and enjoys hiking, recently completing a trip [place] the [place] from [place], [place] to [place], [place]. She has a family history of ovarian cancer in her grandmother.         ALLERGIES:  is allergic to gluten meal and reclast [zoledronic acid].  MEDICATIONS:  Current Outpatient Medications  Medication Sig Dispense Refill   betamethasone valerate ointment (VALISONE) 0.1 %      cholecalciferol (VITAMIN D3) 25 MCG (1000 UT) tablet Take 1,000 Units by mouth daily.     fluorouracil (EFUDEX) 5 % cream Apply topically as needed.     ketoconazole (NIZORAL) 2 % cream Apply 1 application topically daily. 15 g 0   rosuvastatin (CRESTOR) 10 MG tablet 1 tablet  Orally Once a day for 90 days     tamoxifen (NOLVADEX) 20 MG tablet Take 1 tablet (20 mg total) by mouth daily. 90 tablet 4   valACYclovir (VALTREX) 1000 MG tablet Take 1,000 mg by mouth 2 (two) times daily. 01/08/15 as needed for pain     venlafaxine XR  (EFFEXOR-XR) 37.5 MG 24 hr capsule Take 1 capsule (37.5 mg total) by mouth daily with breakfast. 90 capsule 4   No current facility-administered medications for this visit.    PHYSICAL EXAMINATION: ECOG PERFORMANCE STATUS: 1 - Symptomatic but completely ambulatory  Vitals:   02/09/23 1522  BP: (!) 180/91  Pulse: 96  Resp: 18  Temp: 97.6 F (36.4 C)  SpO2: 100%   Filed Weights   02/09/23 1522  Weight: 187 lb 1.6 oz (84.9 kg)    LABORATORY DATA:  I have reviewed the data as listed    Latest Ref Rng & Units 01/26/2021    8:40 AM 01/28/2020   10:18 AM 01/18/2018   10:31 AM  CMP  Glucose 70 - 99 mg/dL 161  096  91   BUN 8 - 23 mg/dL 14  17  13    Creatinine 0.44 - 1.00 mg/dL 0.45  4.09  8.11   Sodium 135 - 145 mmol/L 136  141  141   Potassium 3.5 - 5.1 mmol/L 4.3  4.7  4.5   Chloride 98 - 111 mmol/L 106  108  103   CO2 22 - 32 mmol/L 24  27  30    Calcium 8.9 - 10.3 mg/dL 8.7  9.3  9.8   Total Protein 6.5 - 8.1 g/dL 7.0  6.6  7.2   Total Bilirubin 0.3 - 1.2 mg/dL 0.5  0.4  0.3   Alkaline Phos 38 - 126 U/L 57  58  46   AST 15 - 41 U/L 22  15  17    ALT 0 - 44 U/L 25  19  26      Lab Results  Component Value Date   WBC 5.9 01/26/2021   HGB 13.0 01/26/2021   HCT 39.5 01/26/2021   MCV 88.0 01/26/2021   PLT 195 01/26/2021   NEUTROABS 3.6 01/26/2021    ASSESSMENT & PLAN:  Malignant neoplasm of upper-outer quadrant of right breast in female, estrogen receptor positive (HCC) 1.  02/10/2016: Right lumpectomy T1CN0 stage Ia grade 1 IDC margins negative, ER/PR positive HER2 negative Ki-67 10%, Oncotype score 14 (9% risk of recurrence) 2. adjuvant radiation 04/20/2016-05/17/2016 3.  Genetics: Negative however she is felt to be high risk based on family history to require annual breast MRIs   Current treatment: Tamoxifen started 02/04/2016 x 10 years Tamoxifen toxicities: Tolerating tamoxifen fairly well without any major problems or concerns.   She is an avid Marine scientist and she  works out with a Psychologist, educational 3 times a week.  She went to [place] with her son on multiple hikes.   Breast cancer surveillance: 1.  Breast exam 02/09/2023: Benign 2. mammogram 01/12/2023: Benign breast density category B 3.  Breast MRI 08/04/2022: Right chest wall rib abnormality 4.  CT chest 08/19/2022: The right chest wall enhancement noted on MRI corresponds to a healing nondisplaced fracture of the right fourth rib.  No findings suggestive of metastatic disease.  Hepatic steatosis.  Return to clinic in 1 year for follow-up ------------------------------------- Assessment and Plan    Breast Cancer Survivor on Tamoxifen No reported side effects. Mild to moderate joint pain, not affecting daily activities. -Continue  Tamoxifen. -Reorder Tamoxifen and Venlafaxine for one year.  Family History of Ovarian Cancer Discussed daughter's potential risk and recommended starting mammograms at age 41 due to family history. -Inform daughter to start mammograms at age 71.  Follow-up in 1 year for routine check-up.          No orders of the defined types were placed in this encounter.  The patient has a good understanding of the overall plan. she agrees with it. she will call with any problems that may develop before the next visit here. Total time spent: 30 mins including face to face time and time spent for planning, charting and co-ordination of care   Tamsen Meek, MD 02/10/23

## 2023-03-10 ENCOUNTER — Other Ambulatory Visit (HOSPITAL_COMMUNITY): Payer: Commercial Managed Care - PPO

## 2023-03-10 ENCOUNTER — Encounter (HOSPITAL_COMMUNITY): Payer: Self-pay

## 2023-03-11 ENCOUNTER — Other Ambulatory Visit (HOSPITAL_COMMUNITY): Payer: Commercial Managed Care - PPO

## 2023-04-15 ENCOUNTER — Ambulatory Visit (HOSPITAL_COMMUNITY)
Admission: RE | Admit: 2023-04-15 | Discharge: 2023-04-15 | Disposition: A | Discharge status: Home / Self Care | Payer: Commercial Managed Care - PPO | Source: Ambulatory Visit | Attending: Obstetrics and Gynecology | Admitting: Obstetrics and Gynecology

## 2023-04-15 DIAGNOSIS — R011 Cardiac murmur, unspecified: Secondary | ICD-10-CM | POA: Diagnosis not present

## 2023-04-15 DIAGNOSIS — Z853 Personal history of malignant neoplasm of breast: Secondary | ICD-10-CM | POA: Insufficient documentation

## 2023-04-15 DIAGNOSIS — I340 Nonrheumatic mitral (valve) insufficiency: Secondary | ICD-10-CM | POA: Diagnosis not present

## 2023-04-15 DIAGNOSIS — E785 Hyperlipidemia, unspecified: Secondary | ICD-10-CM | POA: Insufficient documentation

## 2023-04-15 LAB — ECHOCARDIOGRAM COMPLETE
Area-P 1/2: 3.2 cm2
S' Lateral: 2.5 cm

## 2023-04-15 MED ORDER — PERFLUTREN LIPID MICROSPHERE
1.0000 mL | INTRAVENOUS | Status: AC | PRN
  Administered 2023-04-15: 4 mL via INTRAVENOUS

## 2023-07-04 ENCOUNTER — Ambulatory Visit: Admitting: Cardiovascular Disease

## 2023-07-27 ENCOUNTER — Ambulatory Visit: Attending: Cardiovascular Disease | Admitting: Cardiovascular Disease

## 2023-07-27 ENCOUNTER — Encounter: Payer: Self-pay | Admitting: Cardiovascular Disease

## 2023-07-27 VITALS — BP 118/80 | HR 89 | Ht 64.0 in | Wt 189.0 lb

## 2023-07-27 DIAGNOSIS — R931 Abnormal findings on diagnostic imaging of heart and coronary circulation: Secondary | ICD-10-CM | POA: Diagnosis not present

## 2023-07-27 DIAGNOSIS — E782 Mixed hyperlipidemia: Secondary | ICD-10-CM | POA: Diagnosis not present

## 2023-07-27 DIAGNOSIS — Z8249 Family history of ischemic heart disease and other diseases of the circulatory system: Secondary | ICD-10-CM | POA: Diagnosis not present

## 2023-07-27 DIAGNOSIS — R011 Cardiac murmur, unspecified: Secondary | ICD-10-CM | POA: Diagnosis not present

## 2023-07-27 NOTE — Assessment & Plan Note (Signed)
 Elevated coronary calcium score performed 02/11/2022 of 106 the majority which were in the LAD and circumflex.  She is active and asymptomatic.  Based on this we adjusted her risk factor profile and her LDL is below goal for secondary prevention.

## 2023-07-27 NOTE — Assessment & Plan Note (Signed)
 Her OB/GYN, Dr. Gemma Kelp, auscultated a murmur.  A 2D echo was obtained 04/15/2023 which revealed hyperdynamic LV function with mildly asymmetric septal hypertrophy with anterior motion of the intra mitral leaflet but no evidence of MR.

## 2023-07-27 NOTE — Patient Instructions (Signed)
 Medication Instructions:  Your physician recommends that you continue on your current medications as directed. Please refer to the Current Medication list given to you today.  *If you need a refill on your cardiac medications before your next appointment, please call your pharmacy*  Follow-Up: At [facility], you and your health needs are our priority.  As part of our continuing mission to provide you with exceptional heart care, our providers are all part of one team.  This team includes your primary Cardiologist (physician) and Advanced Practice Providers or APPs (Physician Assistants and Nurse Practitioners) who all work together to provide you with the care you need, when you need it.  Your next appointment:   We will see you on an as needed basis.  Provider:   Nanetta Batty, MD    We recommend signing up for the patient portal called "MyChart".  Sign up information is provided on this After Visit Summary.  MyChart is used to connect with patients for Virtual Visits (Telemedicine).  Patients are able to view lab/test results, encounter notes, upcoming appointments, etc.  Non-urgent messages can be sent to your provider as well.   To learn more about what you can do with MyChart, go to [email or web address].   Other Instructions       1st Floor: - Lobby - Registration  - Pharmacy  - Lab - Cafe  2nd Floor: - PV Lab - Diagnostic Testing (echo, CT, nuclear med)  3rd Floor: - Vacant  4th Floor: - TCTS (cardiothoracic surgery) - AFib Clinic - Structural Heart Clinic - Vascular Surgery  - Vascular Ultrasound  5th Floor: - HeartCare Cardiology (general and EP) - Clinical Pharmacy for coumadin, hypertension, lipid, weight-loss medications, and med management appointments    Valet parking services will be available as well.

## 2023-07-27 NOTE — Progress Notes (Signed)
 07/27/2023 VITALIA STOUGH   1955-07-16  [id number]  Primary Physician Bernetta Brilliant Gerarda Knights., MD Primary Cardiologist: Avanell Leigh MD Bennye Bravo, [place]  HPI:  BRIENNE LIGUORI is a 68 y.o.  mildly overweight married Caucasian female mother of 2 children who was in the hotel business.  She was referred by Dr. Merryl Abraham, her OB/GYN, for cardiovascular evaluation because of mildly elevated coronary calcium score.  I last saw her in the office 03/19/2022.  Her cardiac risk factor profile is notable for mild hyperlipidemia and family history.  Her mother did had CABG.  She has had breast cancer and melanoma in the past.  She is very active and works out with a trainer 3 times a week, walks and hikes.  Her most recent lipid profile revealed total cholesterol 195, LDL of 121 HDL 51 performed 12/02/2021.  She did have a coronary calcium score ordered by her OB/GYN on 02/11/2022 which was 106 majority of which was in the LAD and circumflex.  She is completely asymptomatic.  Since that time she has been placed on a low-dose statin drug by her PCP.  Since I saw her a year and a half ago she has done well.  She is still very active, exercises with a trainer walks and hikes.  She just got back from a 3-week trip and [place].  Her OB/GYN, Dr. Cloretta Danes, did auscultate a cardiac murmur and a 2D echo was obtained on 04/15/2023 revealing hyperdynamic LV function with mild asymmetric septal hypertrophy and anterior motion of the mitral valve leaflet leaflet without MR.   Current Meds  Medication Sig   cholecalciferol (VITAMIN D3) 25 MCG (1000 UT) tablet Take 1,000 Units by mouth daily.   Coenzyme Q10 (CO Q 10 PO) Take by mouth.   Probiotic Product (PROBIOTIC PO) Take by mouth.   rosuvastatin (CRESTOR) 10 MG tablet 1 tablet Orally Once a day for 90 days   tamoxifen  (NOLVADEX ) 20 MG tablet Take 1 tablet (20 mg total) by mouth daily.   valACYclovir (VALTREX) 1000 MG tablet Take 1,000 mg by mouth 2  (two) times daily. 01/08/15 as needed for pain   venlafaxine  XR (EFFEXOR -XR) 37.5 MG 24 hr capsule Take 1 capsule (37.5 mg total) by mouth daily with breakfast.     Allergies  Allergen Reactions   Gluten Meal Nausea Only   Reclast  [Zoledronic  Acid] Other (See Comments)    Social History   Socioeconomic History   Marital status: Married    Spouse name: Cornel Diesel   Number of children: 2   Years of education: 16   Highest education level: Not on file  Occupational History    Comment: Art gallery manager, hotels  Tobacco Use   Smoking status: Never   Smokeless tobacco: Never  Substance and Sexual Activity   Alcohol use: Yes    Alcohol/week: 0.0 standard drinks of alcohol    Comment: 2 glasses wine a day   Drug use: No   Sexual activity: Yes    Birth control/protection: Post-menopausal  Other Topics Concern   Not on file  Social History Narrative   Lives at home with husband   decaff only   Social Drivers of Corporate investment banker Strain: Not on file  Food Insecurity: Not on file  Transportation Needs: Not on file  Physical Activity: Not on file  Stress: Not on file  Social Connections: Not on file  Intimate Partner Violence: Not on file  Review of Systems: General: negative for chills, fever, night sweats or weight changes.  Cardiovascular: negative for chest pain, dyspnea on exertion, edema, orthopnea, palpitations, paroxysmal nocturnal dyspnea or shortness of breath Dermatological: negative for rash Respiratory: negative for cough or wheezing Urologic: negative for hematuria Abdominal: negative for nausea, vomiting, diarrhea, bright red blood per rectum, melena, or hematemesis Neurologic: negative for visual changes, syncope, or dizziness All other systems reviewed and are otherwise negative except as noted above.    Blood pressure 118/80, pulse 89, height 5\' 4"  (1.626 m), weight 189 lb (85.7 kg), last menstrual period 05/04/2011, SpO2 98%.   General appearance: alert and no distress Neck: no adenopathy, no carotid bruit, no JVD, supple, symmetrical, trachea midline, and thyroid  not enlarged, symmetric, no tenderness/mass/nodules Lungs: clear to auscultation bilaterally Heart: regular rate and rhythm, S1, S2 normal, no murmur, click, rub or gallop Extremities: extremities normal, atraumatic, no cyanosis or edema Pulses: 2+ and symmetric Skin: Skin color, texture, turgor normal. No rashes or lesions Neurologic: Grossly normal  EKG EKG Interpretation Date/Time:  Wednesday July 27 2023 15:35:39 EDT Ventricular Rate:  89 PR Interval:  134 QRS Duration:  84 QT Interval:  372 QTC Calculation: 452 R Axis:   77  Text Interpretation: Normal sinus rhythm Normal ECG No previous ECGs available Confirmed by Lauro Portal [phone]) on 07/27/2023 3:49:20 PM    ASSESSMENT AND PLAN:   Hyperlipidemia History of hyperlipidemia on low-dose statin therapy with lipid profile performed 12/30/2022 revealing total cholesterol 259, LDL 55 and HDL 50.  Family history of heart disease Family history of heart disease with mother who had CABG.  Elevated coronary artery calcium score Elevated coronary calcium score performed 02/11/2022 of 106 the majority which were in the LAD and circumflex.  She is active and asymptomatic.  Based on this we adjusted her risk factor profile and her LDL is below goal for secondary prevention.  Cardiac murmur Her OB/GYN, Dr. Gemma Kelp, auscultated a murmur.  A 2D echo was obtained 04/15/2023 which revealed hyperdynamic LV function with mildly asymmetric septal hypertrophy with anterior motion of the intra mitral leaflet but no evidence of MR.     Avanell Leigh MD FACP,FACC,FAHA, [facility] 07/27/2023 3:58 PM

## 2023-07-27 NOTE — Assessment & Plan Note (Signed)
 History of hyperlipidemia on low-dose statin therapy with lipid profile performed 12/30/2022 revealing total cholesterol 259, LDL 55 and HDL 50.

## 2023-07-27 NOTE — Assessment & Plan Note (Signed)
 Family history of heart disease with mother who had CABG.

## 2023-09-29 ENCOUNTER — Other Ambulatory Visit: Payer: Self-pay

## 2023-10-25 ENCOUNTER — Other Ambulatory Visit (HOSPITAL_COMMUNITY): Payer: Self-pay

## 2023-10-25 ENCOUNTER — Encounter: Payer: Self-pay | Admitting: Obstetrics and Gynecology

## 2023-10-27 ENCOUNTER — Telehealth: Payer: Self-pay

## 2023-10-27 NOTE — Telephone Encounter (Signed)
 Dr. McComb, patient will be scheduled as soon as possible.  Auth Submission: NO AUTH NEEDED Site of care: Site of care: [facility] Payer: UHC/UMR commercial Medication & CPT/J Code(s) submitted: Evenity (Romosozumab) [id number] Diagnosis Code:  Route of submission (phone, fax, portal): portal/phone/[employer] support Auth type: Buy/Bill PB Units/visits requested: 210mg  x 5 doses Approval from: 10/27/23 to 04/04/24   I am attaching a screenshot of the portal that states no auth is needed in the media tab.  Here are the numerous call made to confirm no shara is needed: Ph # [phone] No auth needed - ref # [id number] Ph # [phone] No auth needed - ref # [id number] @ 2:35pm Ph # [phone] No auth needed - ref #[id number]   I also requested help from Amy, a representative with [employer]. After extensive research and speaking with reps and her supervisor she concluded that a prior auth is not needed.

## 2023-11-25 ENCOUNTER — Ambulatory Visit

## 2023-11-25 VITALS — BP 134/80 | HR 70 | Temp 98.7°F | Resp 16 | Ht 64.0 in | Wt 185.6 lb

## 2023-11-25 DIAGNOSIS — M810 Age-related osteoporosis without current pathological fracture: Secondary | ICD-10-CM

## 2023-11-25 MED ORDER — ROMOSOZUMAB-AQQG 105 MG/1.17ML ~~LOC~~ SOSY
210.0000 mg | PREFILLED_SYRINGE | Freq: Once | SUBCUTANEOUS | Status: AC
  Administered 2023-11-25: 210 mg via SUBCUTANEOUS
  Filled 2023-11-25: qty 2.34

## 2023-11-25 NOTE — Progress Notes (Signed)
 Diagnosis: Osteoporosis  Provider:  Mannam, Praveen MD  Procedure: Injection  Evenity  (Romosozumab -aqqg), Dose: 210 mg, Site: subcutaneous, Number of injections: 2  Injection Site(s): Right lower quad. abdomne  Post Care: Observation period completed  Discharge: Condition: Good, Destination: Home . AVS Provided  Performed by:  Trudy Lamarr LABOR, RN

## 2023-11-25 NOTE — Patient Instructions (Signed)
 Romosozumab Injection  What is this medication?  ROMOSOZUMAB (roe moe SOZ ue mab) prevents and treats osteoporosis. It works by Interior and spatial designer stronger and less likely to break (fracture). It is a monoclonal antibody.  This medicine may be used for other purposes; ask your health care provider or pharmacist if you have questions.  COMMON BRAND NAME(S): EVENITY  What should I tell my care team before I take this medication?  They need to know if you have any of these conditions:  Dental disease  Heart attack  Heart disease  Kidney problems  Low levels of calcium in the blood  On dialysis  Stroke  Wear dentures  An unusual or allergic reaction to romosozumab, other medications, foods, dyes or preservatives  Pregnant or trying to get pregnant  Breast-feeding  How should I use this medication?  This medication is injected under the skin. It is given by your care team in a hospital or clinic setting.  A special MedGuide will be given to you by the pharmacist with each prescription and refill. Be sure to read this information carefully each time.  Talk to your care team about the use of this medication in children. Special care may be needed.  Overdosage: If you think you have taken too much of this medicine contact a poison control center or emergency room at once.  NOTE: This medicine is only for you. Do not share this medicine with others.  What if I miss a dose?  Keep appointments for follow-up doses. It is important not to miss your dose. Call your care team if you are unable to keep an appointment.  What may interact with this medication?  Interactions are not expected.  This list may not describe all possible interactions. Give your health care provider a list of all the medicines, herbs, non-prescription drugs, or dietary supplements you use. Also tell them if you smoke, drink alcohol, or use illegal drugs. Some items may interact with your medicine.  What should I watch for while using this medication?  Your  condition will be monitored carefully while you are receiving this medication.  You may need bloodwork while taking this medication.  You should make sure you get enough calcium and vitamin D while you are taking this medication. Discuss the foods you eat and the vitamins you take with your care team.  Some people who take this medication have severe bone, joint, or muscle pain. This medication may also increase your risk for jaw problems or a broken thigh bone. Tell your care team right away if you have severe pain in your jaw, bones, joints, or muscles. Tell you care team if you have any pain that does not go away or that gets worse.  Tell your dentist and dental surgeon that you are taking this medication. You should not have major dental surgery while on this medication. See your dentist to have a dental exam and fix any dental problems before starting this medication. Take good care of your teeth while on this medication. Make sure you see your dentist for regular follow-up appointments.  What side effects may I notice from receiving this medication?  Side effects that you should report to your care team as soon as possible:  Allergic reactions or angioedema--skin rash, itching or hives, swelling of the face, eyes, lips, tongue, arms, or legs, trouble swallowing or breathing  Heart attack--pain or tightness in the chest, shoulders, arms, or jaw, nausea, shortness of breath, cold or clammy skin,  feeling faint or lightheaded  Low calcium level--muscle pain or cramps, confusion, tingling, or numbness in the hands or feet  Osteonecrosis of the jaw--pain, swelling, or redness in the mouth, numbness of the jaw, poor healing after dental work, unusual discharge from the mouth, visible bones in the mouth  Severe bone, joint, or muscle pain  Stroke--sudden numbness or weakness of the face, arm, or leg, trouble speaking, confusion, trouble walking, loss of balance or coordination, dizziness, severe headache, change in  vision  Side effects that usually do not require medical attention (report to your care team if they continue or are bothersome):  Headache  Joint pain  Muscle spasms  Pain, redness, or irritation at injection site  Swelling of the ankles, hands, or feet  This list may not describe all possible side effects. Call your doctor for medical advice about side effects. You may report side effects to FDA at 1-800-FDA-1088.  Where should I keep my medication?  This medication is given in a hospital or clinic. It will not be stored at home.  NOTE: This sheet is a summary. It may not cover all possible information. If you have questions about this medicine, talk to your doctor, pharmacist, or health care provider.   2024 Elsevier/Gold Standard (2021-04-22 00:00:00)

## 2023-12-23 ENCOUNTER — Ambulatory Visit (INDEPENDENT_AMBULATORY_CARE_PROVIDER_SITE_OTHER)

## 2023-12-23 VITALS — BP 137/79 | HR 81 | Temp 97.5°F | Resp 18 | Ht 64.0 in | Wt 185.0 lb

## 2023-12-23 DIAGNOSIS — M810 Age-related osteoporosis without current pathological fracture: Secondary | ICD-10-CM | POA: Diagnosis not present

## 2023-12-23 MED ORDER — ROMOSOZUMAB-AQQG 105 MG/1.17ML ~~LOC~~ SOSY
210.0000 mg | PREFILLED_SYRINGE | Freq: Once | SUBCUTANEOUS | Status: AC
  Administered 2023-12-23: 210 mg via SUBCUTANEOUS
  Filled 2023-12-23: qty 2.34

## 2023-12-23 NOTE — Telephone Encounter (Signed)
 Evenity  copay card information:  [employer] - Evenity  Co-Pay Program Member ID: [id number] Enrollment date: 12/23/2023

## 2023-12-23 NOTE — Progress Notes (Signed)
 Diagnosis: Osteoporosis  Provider:  Mannam, Praveen MD  Procedure: Injection  Evenity  (Romosozumab -aqqg), Dose: 210 mg, Site: subcutaneous, Number of injections: 2  Injection Site(s): Left lower quad. abdomen and Right lower quad. abdomne  Post Care: left and right lower abdominal quad  Discharge: Condition: Good, Destination: Home . AVS Declined  Performed by:  Maximiano JONELLE Pouch, LPN

## 2024-01-05 ENCOUNTER — Other Ambulatory Visit: Payer: Self-pay | Admitting: Hematology and Oncology

## 2024-01-05 DIAGNOSIS — Z1231 Encounter for screening mammogram for malignant neoplasm of breast: Secondary | ICD-10-CM

## 2024-01-13 ENCOUNTER — Ambulatory Visit
Admission: RE | Admit: 2024-01-13 | Discharge: 2024-01-13 | Disposition: A | Discharge status: Home / Self Care | Source: Ambulatory Visit | Attending: Hematology and Oncology | Admitting: Hematology and Oncology

## 2024-01-13 DIAGNOSIS — Z1231 Encounter for screening mammogram for malignant neoplasm of breast: Secondary | ICD-10-CM

## 2024-01-20 ENCOUNTER — Ambulatory Visit (INDEPENDENT_AMBULATORY_CARE_PROVIDER_SITE_OTHER)

## 2024-01-20 VITALS — BP 128/76 | HR 74 | Temp 97.4°F | Resp 18 | Ht 64.0 in | Wt 184.2 lb

## 2024-01-20 DIAGNOSIS — M810 Age-related osteoporosis without current pathological fracture: Secondary | ICD-10-CM

## 2024-01-20 MED ORDER — ROMOSOZUMAB-AQQG 105 MG/1.17ML ~~LOC~~ SOSY
210.0000 mg | PREFILLED_SYRINGE | Freq: Once | SUBCUTANEOUS | Status: AC
  Administered 2024-01-20: 210 mg via SUBCUTANEOUS
  Filled 2024-01-20: qty 2.34

## 2024-01-20 NOTE — Progress Notes (Signed)
 Diagnosis: Osteoporosis  Provider:  Mannam, Praveen MD  Procedure: Injection  Evenity  (Romosozumab -aqqg), Dose: 210 mg, Site: subcutaneous, Number of injections: 2  Injection Site(s): Left lower quad. abdomen and Right lower quad. abdomne  Post Care: Patient declined observation  Discharge: Condition: Good, Destination: Home . AVS Declined  Performed by:  Star East, LPN

## 2024-01-31 NOTE — Therapy (Signed)
 OUTPATIENT PHYSICAL THERAPY NEURO EVALUATION   Patient Name: Julie Mack MRN: [id number] DOB:June 19, 1955, 68 y.o., female Today's Date: 02/01/2024   PCP: [name], MD REFERRING PROVIDER: [name], MD  END OF SESSION:  PT End of Session - 02/01/24 0846     Visit Number 1    Number of Visits 5   4 visits plus Eval   Date for Recertification  03/16/24   For scheduling delays   Authorization Type UMR/UHC    PT Start Time 0845    PT Stop Time 0930    PT Time Calculation (min) 45 min    Equipment Utilized During Treatment Gait belt    Activity Tolerance Patient tolerated treatment well    Behavior During Therapy [facility] for tasks assessed/performed          Past Medical History:  Diagnosis Date   Asthma    allergy induced asthma as child, none now   Breast cancer (HCC) 01/2016   right breast cancer   Family history of breast cancer    Family history of ovarian cancer    Headache    History of radiation therapy 04/20/16- 05/17/16   Right Breast 40.05 Gy in 15 fractions and Right Breast boost 10 Gy in 5 fractions.    Melanoma (HCC)    in situ   Osteoporosis    Personal history of radiation therapy 2017   Past Surgical History:  Procedure Laterality Date   ANKLE FRACTURE SURGERY Right 2015   BREAST LUMPECTOMY Right 02/10/2016   BREAST LUMPECTOMY WITH RADIOACTIVE SEED AND SENTINEL LYMPH NODE BIOPSY Right 02/10/2016   Procedure: RIGHT BREAST LUMPECTOMY WITH RADIOACTIVE SEED AND RIGHT SENTINEL LYMPH NODE BIOPSY WITH INJECT BLUE DYE;  Surgeon: Elon Pacini, MD;  Location: [facility];  Service: General;  Laterality: Right;   DILATION AND CURETTAGE OF UTERUS     ECTOPIC PREGNANCY SURGERY  1996   EYE SURGERY  1967   cyst removed   TONSILLECTOMY  1966   Patient Active Problem List   Diagnosis Date Noted   Cardiac murmur 07/27/2023   Osteoporosis 08/05/2022   Osteoarthritis 08/05/2022   Hyperlipidemia 03/19/2022   Family history of  heart disease 03/19/2022   Elevated coronary artery calcium score 03/19/2022   Muscle tension dysphonia 02/18/2021   Hoarseness 02/18/2021   Chronic cough 02/18/2021   Left wrist pain 05/23/2020   Laryngospasms 11/14/2018   Pain in joint of left shoulder 04/09/2017   Closed fracture of proximal end of left humerus 04/09/2017   Genetic testing 03/05/2016   Family history of breast cancer    Family history of ovarian cancer    Malignant neoplasm of upper-outer quadrant of right breast in female, estrogen receptor positive (HCC) 01/26/2016   Infiltrating ductal carcinoma of female breast (HCC) 01/05/2016    ONSET DATE: 01/27/2024 (Date of referral)  REFERRING DIAG: R26.81 (ICD-10-CM) - Unsteadiness on feet  THERAPY DIAG:  Other abnormalities of gait and mobility  History of falling  Rationale for Evaluation and Treatment: Rehabilitation  SUBJECTIVE:  SUBJECTIVE STATEMENT: Pt reports her husband gets worried d/t pt tripping.  Unsure why she is tripping other than she catches her foot.  Can happen a couple times a day or not at all.  Recent fall hiking (broke 2 pinkie fingers) beginning of September 2025.  Tripping has been going on for years.  Pt reports being more cautious d/t concerns of falling.  Does personal training 3x's per week and does a lot of free weights.  Has more difficulty with eyes closed activities. Pt accompanied by: self  PERTINENT HISTORY: PT referral for unsteady gait and recent fall.  PMH includes R ankle fx s/p sx 2015, R breast CA s/p lumpectomy, OA, tubular adenoma, recurrent facial neuralgia, R shoulder fx, rhinoplasty.  PAIN:  Are you having pain? Yes: NPRS scale: 0/10 currently Pain location: L foot pain Pain description: sharp; cramping; intermittent for past  year Aggravating factors: wearing wrong shoes; swimming or at gym; cramp at night Relieving factors: nothing  PRECAUTIONS: Fall and Other: recent B pinky fx's beginning Sept 2025; no BP R UE  RED FLAGS: None   WEIGHT BEARING RESTRICTIONS: Recent pinky finger fx's B--pt reports no restrictions anymore  FALLS: Has patient fallen in last 6 months? Yes. Number of falls 1 fall hiking; trips but doesn't usually fall  LIVING ENVIRONMENT: Lives with: lives with their spouse Lives in: House/apartment 2 level (lives in [place] on 2nd floor) Stairs: Yes: External: 20 steps; on right going up, on left going up, and can reach both Has following equipment at home: Grab bars  PLOF: Independent  PATIENT GOALS: To get my husband to stop saying why are you tripping.  To stop catching foot when walking.  OBJECTIVE:  Note: Objective measures were completed at Evaluation unless otherwise noted.  DIAGNOSTIC FINDINGS: Pt reports no recent imaging.  COGNITION: Overall cognitive status: Within functional limits for tasks assessed   SENSATION: Light touch intact B LE's  COORDINATION: Intact B LE rapid alternating toe taps and heel to shin in sitting   POSTURE: rounded shoulders, forward head, and posterior pelvic tilt  LOWER EXTREMITY ROM:     Active  Right Eval Left Eval  Hip flexion WNL WNL  Hip extension    Hip abduction    Hip adduction    Hip internal rotation    Hip external rotation    Knee flexion WNL WNL  Knee extension WNL WNL  Ankle dorsiflexion    Ankle plantarflexion WNL WNL  Ankle inversion    Ankle eversion     (Blank rows = not tested)  LOWER EXTREMITY MMT:    MMT Right Eval Left Eval  Hip flexion 5/5 5/5  Hip extension    Hip abduction    Hip adduction    Hip internal rotation    Hip external rotation    Knee flexion 5/5 5/5  Knee extension 5/5 5/5  Ankle dorsiflexion 5/5 5/5  Ankle plantarflexion At least 3/5 AROM At least 3/5 AROM  Ankle  inversion    Ankle eversion    (Blank rows = not tested)  BED MOBILITY: Independent   TRANSFERS: Independent   STAIRS: Findings: Level of Assistance: Modified independence, Stair Negotiation Technique: Forwards with Bilateral Rails, Number of Stairs: 4, Height of Stairs: 6 inch   , and Comments: pt appearing more cautious but is steady GAIT: Findings: Gait Characteristics: WFL and step to pattern, Distance walked: clinic distances, Assistive device utilized:None, Level of assistance: Complete Independence, and Comments: steady ambulation; no tripping noted during  session  FUNCTIONAL TESTS:  10 meter walk test: 1.07 m/second (9.34 seconds; no AD use) Functional gait assessment: 28/30 on 02/01/24 FUNCTIONAL GAIT ASSESSMENT  Date: 02/01/24 Score  GAIT LEVEL SURFACE Instructions: Walk at your normal speed from here to the next mark (6 m) [20 ft]. (3) Normal - Walks 6 m (20 ft) in less than 5.5 seconds, no assistive devices, good speed, no evidence for imbalance, normal gait pattern, deviates no more than 15.24 cm (6 in) outside of the 30.48-cm (12-in) walkway width. 4.06 seconds  2.   CHANGE IN GAIT SPEED Instructions: Begin walking at your normal pace (for 1.5 m [5 ft]). When I tell you "go," walk as fast as you can (for 1.5 m [5 ft]). When I tell you "slow," walk as slowly as you can (for 1.5 m [5 ft]. (3) Normal - Able to smoothly change walking speed without loss of balance or gait deviation. Shows a significant difference in walking speeds between normal, fast, and slow speeds. Deviates no more than 15.24 cm (6 in) outside of the 30.48-cm (12-in) walkway width.  3.    GAIT WITH HORIZONTAL HEAD TURNS Instructions: Walk from here to the next mark 6 m (20 ft) away. Begin walking at your normal pace. Keep walking straight; after 3 steps, turn your head to the right and keep walking straight while looking to the right. After 3 more steps, turn your head to the left and keep walking straight  while looking left. Continue alternating looking right and left. (3) Normal - Performs head turns smoothly with no change in gait. Deviates no more than 15.24 cm (6 in) outside 30.48-cm (12-in) walkway width.  4.   GAIT WITH VERTICAL HEAD TURNS Instructions: Walk from here to the next mark (6 m [20 ft]). Begin walking at your normal pace. Keep walking straight; after 3 steps, tip your head up and keep walking straight while looking up. After 3 more steps, tip your head down, keep walking straight while looking down. Continue  alternating looking up and down every 3 steps until you have completed 2 repetitions in each direction. (3) Normal - Performs head turns with no change in gait. Deviates no more than 15.24 cm (6 in) outside 30.48-cm (12-in) walkway width.  5.  GAIT AND PIVOT TURN Instructions: Begin with walking at your normal pace. When I tell you, "turn and stop," turn as quickly as you can to face the opposite direction and stop. (3) Normal - Pivot turns safely within 3 seconds and stops quickly with no loss of balance  6.   STEP OVER OBSTACLE Instructions: Begin walking at your normal speed. When you come to the shoe box, step over it, not around it, and keep walking. (3) Normal - Is able to step over 2 stacked shoe boxes taped together (22.86 cm [9 in] total height) without changing gait speed; no evidence of imbalance.  7.   GAIT WITH NARROW BASE OF SUPPORT Instructions: Walk on the floor with arms folded across the chest, feet aligned heel to toe in tandem for a distance of 3.6 m [12 ft]. The number of steps taken in a straight line are counted for a maximum of 10 steps. (3) Normal - Is able to ambulate for 10 steps heel to toe with no staggering.  8.   GAIT WITH EYES CLOSED Instructions: Walk at your normal speed from here to the next mark (6 m [20 ft]) with your eyes closed. (2) Mild impairment - Walks 6  m (20 ft), uses assistive device, slower speed, mild gait deviations, deviates 15.24  -25.4 cm (6 -10 in) outside 30.48-cm (12-in) walkway width. Ambulates 6 m (20 ft) in less than 9 seconds but greater than 7 seconds 5.56 seconds; veers mildly to R  9.   AMBULATING BACKWARDS Instructions: Walk backwards until I tell you to stop (3) Normal - Walks 6 m (20 ft), no assistive devices, good speed, no evidence for imbalance, normal gait pattern, deviates no more than 15.24 cm (6 in) outside 30.48-cm (12-in) walkway width. 8.22 seconds  10. STEPS Instructions: Walk up these stairs as you would at home (ie, using the rail if necessary). At the top turn around and walk down. (2) Mild impairment-Alternating feet, must use rail.  Total 28/30   Interpretation of scores: Non-Specific Older Adults Cutoff Score: <=22/30 = risk of falls Parkinson's Disease Cutoff score <15/30= fall risk (Hoehn & Yahr 1-4)  Minimally Clinically Important Difference (MCID)  Stroke (acute, subacute, and chronic) = MDC: 4.2 points Vestibular (acute) = MDC: 6 points Community Dwelling Older Adults =  MCID: 4 points Parkinson's Disease  =  MDC: 4.3 points  (Academy of Neurologic Physical Therapy (nd). Functional Gait Assessment. Retrieved from https://www.neuropt.org/docs/default-source/cpgs/core-outcome-measures/function-gait-assessment-pocket-guide-proof9-(2).pdf?sfvrsn=b56f35043_0.)  PATIENT SURVEYS:  TBA  VITALS: Vitals:   02/01/24 0900  BP: 134/69  Pulse: 73                                                                                                                               TREATMENT DATE: 02/01/24  Therapeutic Exercise: SLS x30 seconds B LE's: vc's for even hips (no hip drop) and using finger tips on counter as needed for balance; pt issued HEP handout (see below for details).   PATIENT EDUCATION: Education details: Eval results; POC; HEP Person educated: Patient Education method: Explanation, Demonstration, Verbal cues, and Handouts Education comprehension: verbalized understanding,  returned demonstration, and verbal cues required  HOME EXERCISE PROGRAM: Access Code: [email or web address] URL: [email or web address] Date: 02/01/2024 Prepared by: Damien Caulk  Exercises - Standing Single Leg Stance with Counter Support  - 1-2 x daily - 7 x weekly - 2 sets - 1 reps - 30 second hold  GOALS: Goals reviewed with patient? Yes  LONG TERM GOALS: Target date: 02/29/2024 (STG = LTG d/t length of POC)  Pt will be independent with HEP in order to improve strength and balance in order to decrease fall risk and improve function at home for ADL's. Baseline: Issued initial HEP 02/01/24 Goal status: INITIAL  2.  Patient will deny any falls over past 2 weeks to demonstrate improved safety awareness at home and community. Baseline: h/o fall in Sept and intermittent tripping Goal status: INITIAL  3.  Pt will increase by at least 0.13 m/s in order to demonstrate clinically significant improvement in community ambulation.  Baseline: 1.07 m/second on Eval Goal status: INITIAL  4.  Pt will report improved foot clearance (decreased reports of tripping) for safety  with gait. Baseline:  intermittent tripping Goal status: INITIAL  ASSESSMENT:  CLINICAL IMPRESSION: Patient is a 68 y.o. female who was seen today for physical therapy evaluation and treatment for unsteadiness on feet.  Patient presents with h/o falling and intermittent tripping (fluctuates depending on the day). These impairments are limiting patient from confidence with daily activities d/t concerns of tripping/falling.  Evaluation included the following assessment tools: and FGA.  Pt scored 1.07 m/second on the 10 Meter Walk Test indicating pt is a [employer] and not at increased fall risk.  Pt scored 28/30 on FGA indicating pt is not at increased risk of falls.  Pt reports amount of tripping depends on the day (and sometimes doesn't trip at all); no tripping noted during the session.   Patient will  benefit from skilled PT to address noted concerns (improve consistency of foot clearance and higher level balance), improve overall function, and progress towards long term goals.  OBJECTIVE IMPAIRMENTS: Abnormal gait, decreased balance, decreased knowledge of condition, decreased knowledge of use of DME, difficulty walking, and pain.   ACTIVITY LIMITATIONS: stairs and locomotion level  PARTICIPATION LIMITATIONS: cleaning, laundry, shopping, community activity, occupation, and yard work  PERSONAL FACTORS: Past/current experiences and 1 comorbidity: R ankle fx s/p sx are also affecting patient's functional outcome.   REHAB POTENTIAL: Good  CLINICAL DECISION MAKING: Stable/uncomplicated  EVALUATION COMPLEXITY: Low  PLAN:  PT FREQUENCY: 1x/week  PT DURATION: 4 weeks  PLANNED INTERVENTIONS: [id number]- PT Re-evaluation, 97750- Physical Performance Testing, 97110-Therapeutic exercises, 97530- Therapeutic activity, [id number]- Neuromuscular re-education, 97535- Self Care, [id number]- Manual therapy, [id number]- Gait training, [phone]- Orthotic Initial, [phone]- Orthotic/Prosthetic subsequent, [phone]- Aquatic Therapy, [phone]- Electrical stimulation (manual), [phone]- Ultrasound, Patient/Family education, Balance training, Stair training, Taping, Joint mobilization, Spinal mobilization, Vestibular training, DME instructions, Cryotherapy, and Moist heat  PLAN FOR NEXT SESSION: Advance HEP (for balance); improve B LE foot clearance; dual tasking with walking and balance   Damien Caulk, PT 02/01/2024, 7:06 PM

## 2024-02-01 ENCOUNTER — Encounter: Payer: Self-pay | Admitting: Physical Therapy

## 2024-02-01 ENCOUNTER — Ambulatory Visit: Attending: Internal Medicine | Admitting: Physical Therapy

## 2024-02-01 ENCOUNTER — Other Ambulatory Visit: Payer: Self-pay

## 2024-02-01 VITALS — BP 134/69 | HR 73

## 2024-02-01 DIAGNOSIS — Z9181 History of falling: Secondary | ICD-10-CM | POA: Diagnosis present

## 2024-02-01 DIAGNOSIS — R2689 Other abnormalities of gait and mobility: Secondary | ICD-10-CM | POA: Diagnosis present

## 2024-02-08 ENCOUNTER — Encounter: Payer: Self-pay | Admitting: Physical Therapy

## 2024-02-08 ENCOUNTER — Ambulatory Visit: Attending: Internal Medicine | Admitting: Physical Therapy

## 2024-02-08 DIAGNOSIS — Z9181 History of falling: Secondary | ICD-10-CM | POA: Diagnosis present

## 2024-02-08 DIAGNOSIS — R2689 Other abnormalities of gait and mobility: Secondary | ICD-10-CM | POA: Diagnosis present

## 2024-02-08 NOTE — Therapy (Signed)
 OUTPATIENT PHYSICAL THERAPY NEURO TREATMENT   Patient Name: Julie Mack MRN: [id number] DOB:May 27, 1955, 68 y.o., female Today's Date: 02/08/2024   PCP: [name], MD REFERRING PROVIDER: [name], MD  END OF SESSION:  PT End of Session - 02/08/24 1536     Visit Number 2    Number of Visits 5   4 visits plus Eval   Date for Recertification  03/16/24   For scheduling delays   Authorization Type UMR/UHC    PT Start Time 1535    PT Stop Time 1620    PT Time Calculation (min) 45 min    Equipment Utilized During Treatment Gait belt    Activity Tolerance Patient tolerated treatment well    Behavior During Therapy [facility] for tasks assessed/performed          Past Medical History:  Diagnosis Date   Asthma    allergy induced asthma as child, none now   Breast cancer (HCC) 01/2016   right breast cancer   Family history of breast cancer    Family history of ovarian cancer    Headache    History of radiation therapy 04/20/16- 05/17/16   Right Breast 40.05 Gy in 15 fractions and Right Breast boost 10 Gy in 5 fractions.    Melanoma (HCC)    in situ   Osteoporosis    Personal history of radiation therapy 2017   Past Surgical History:  Procedure Laterality Date   ANKLE FRACTURE SURGERY Right 2015   BREAST LUMPECTOMY Right 02/10/2016   BREAST LUMPECTOMY WITH RADIOACTIVE SEED AND SENTINEL LYMPH NODE BIOPSY Right 02/10/2016   Procedure: RIGHT BREAST LUMPECTOMY WITH RADIOACTIVE SEED AND RIGHT SENTINEL LYMPH NODE BIOPSY WITH INJECT BLUE DYE;  Surgeon: Elon Pacini, MD;  Location: [facility];  Service: General;  Laterality: Right;   DILATION AND CURETTAGE OF UTERUS     ECTOPIC PREGNANCY SURGERY  1996   EYE SURGERY  1967   cyst removed   TONSILLECTOMY  1966   Patient Active Problem List   Diagnosis Date Noted   Cardiac murmur 07/27/2023   Osteoporosis 08/05/2022   Osteoarthritis 08/05/2022   Hyperlipidemia 03/19/2022   Family history of  heart disease 03/19/2022   Elevated coronary artery calcium score 03/19/2022   Muscle tension dysphonia 02/18/2021   Hoarseness 02/18/2021   Chronic cough 02/18/2021   Left wrist pain 05/23/2020   Laryngospasms 11/14/2018   Pain in joint of left shoulder 04/09/2017   Closed fracture of proximal end of left humerus 04/09/2017   Genetic testing 03/05/2016   Family history of breast cancer    Family history of ovarian cancer    Malignant neoplasm of upper-outer quadrant of right breast in female, estrogen receptor positive (HCC) 01/26/2016   Infiltrating ductal carcinoma of female breast (HCC) 01/05/2016    ONSET DATE: 01/27/2024 (Date of referral)  REFERRING DIAG: R26.81 (ICD-10-CM) - Unsteadiness on feet  THERAPY DIAG:  Other abnormalities of gait and mobility  History of falling  Rationale for Evaluation and Treatment: Rehabilitation  SUBJECTIVE:  SUBJECTIVE STATEMENT: Has not tripped (or fallen) since last time seen by therapy.  Still working out 3x's per week.  Reports more difficulties with balance with eyes closed. Pt accompanied by: self  PERTINENT HISTORY: PT referral for unsteady gait and recent fall.  PMH includes R ankle fx s/p sx 2015, R breast CA s/p lumpectomy, OA, tubular adenoma, recurrent facial neuralgia, R shoulder fx, rhinoplasty.  PAIN:  Are you having pain? Yes: NPRS scale: 0/10 currently Pain location: L foot pain Pain description: sharp; cramping; intermittent for past year Aggravating factors: wearing wrong shoes; swimming or at gym; cramp at night Relieving factors: nothing  PRECAUTIONS: Fall and Other: recent B pinky fx's beginning Sept 2025; no BP R UE  RED FLAGS: None   WEIGHT BEARING RESTRICTIONS: Recent pinky finger fx's B--pt reports no restrictions  anymore  FALLS: Has patient fallen in last 6 months? Yes. Number of falls 1 fall hiking; trips but doesn't usually fall  LIVING ENVIRONMENT: Lives with: lives with their spouse Lives in: House/apartment 2 level (lives in [place] on 2nd floor) Stairs: Yes: External: 20 steps; on right going up, on left going up, and can reach both Has following equipment at home: Grab bars  PLOF: Independent  PATIENT GOALS: To get my husband to stop saying why are you tripping.  To stop catching foot when walking.  OBJECTIVE:  Note: Objective measures were completed at Evaluation unless otherwise noted.  DIAGNOSTIC FINDINGS: Pt reports no recent imaging.  COGNITION: Overall cognitive status: Within functional limits for tasks assessed   SENSATION: Light touch intact B LE's  COORDINATION: Intact B LE rapid alternating toe taps and heel to shin in sitting   POSTURE: rounded shoulders, forward head, and posterior pelvic tilt  LOWER EXTREMITY ROM:     Active  Right Eval Left Eval  Hip flexion WNL WNL  Hip extension    Hip abduction    Hip adduction    Hip internal rotation    Hip external rotation    Knee flexion WNL WNL  Knee extension WNL WNL  Ankle dorsiflexion    Ankle plantarflexion WNL WNL  Ankle inversion    Ankle eversion     (Blank rows = not tested)  LOWER EXTREMITY MMT:    MMT Right Eval Left Eval  Hip flexion 5/5 5/5  Hip extension    Hip abduction    Hip adduction    Hip internal rotation    Hip external rotation    Knee flexion 5/5 5/5  Knee extension 5/5 5/5  Ankle dorsiflexion 5/5 5/5  Ankle plantarflexion At least 3/5 AROM At least 3/5 AROM  Ankle inversion    Ankle eversion    (Blank rows = not tested)  BED MOBILITY: Independent   TRANSFERS: Independent   STAIRS: Findings: Level of Assistance: Modified independence, Stair Negotiation Technique: Forwards with Bilateral Rails, Number of Stairs: 4, Height of Stairs: 6 inch   , and Comments: pt  appearing more cautious but is steady GAIT: Findings: Gait Characteristics: WFL and step to pattern, Distance walked: clinic distances, Assistive device utilized:None, Level of assistance: Complete Independence, and Comments: steady ambulation; no tripping noted during session  FUNCTIONAL TESTS:  10 meter walk test: 1.07 m/second (9.34 seconds; no AD use) Functional gait assessment: 28/30 on 02/01/24 FUNCTIONAL GAIT ASSESSMENT  Date: 02/01/24 Score  GAIT LEVEL SURFACE Instructions: Walk at your normal speed from here to the next mark (6 m) [20 ft]. (3) Normal - Walks 6 m (20 ft) in  less than 5.5 seconds, no assistive devices, good speed, no evidence for imbalance, normal gait pattern, deviates no more than 15.24 cm (6 in) outside of the 30.48-cm (12-in) walkway width. 4.06 seconds  2.   CHANGE IN GAIT SPEED Instructions: Begin walking at your normal pace (for 1.5 m [5 ft]). When I tell you "go," walk as fast as you can (for 1.5 m [5 ft]). When I tell you "slow," walk as slowly as you can (for 1.5 m [5 ft]. (3) Normal - Able to smoothly change walking speed without loss of balance or gait deviation. Shows a significant difference in walking speeds between normal, fast, and slow speeds. Deviates no more than 15.24 cm (6 in) outside of the 30.48-cm (12-in) walkway width.  3.    GAIT WITH HORIZONTAL HEAD TURNS Instructions: Walk from here to the next mark 6 m (20 ft) away. Begin walking at your normal pace. Keep walking straight; after 3 steps, turn your head to the right and keep walking straight while looking to the right. After 3 more steps, turn your head to the left and keep walking straight while looking left. Continue alternating looking right and left. (3) Normal - Performs head turns smoothly with no change in gait. Deviates no more than 15.24 cm (6 in) outside 30.48-cm (12-in) walkway width.  4.   GAIT WITH VERTICAL HEAD TURNS Instructions: Walk from here to the next mark (6 m [20 ft]). Begin  walking at your normal pace. Keep walking straight; after 3 steps, tip your head up and keep walking straight while looking up. After 3 more steps, tip your head down, keep walking straight while looking down. Continue  alternating looking up and down every 3 steps until you have completed 2 repetitions in each direction. (3) Normal - Performs head turns with no change in gait. Deviates no more than 15.24 cm (6 in) outside 30.48-cm (12-in) walkway width.  5.  GAIT AND PIVOT TURN Instructions: Begin with walking at your normal pace. When I tell you, "turn and stop," turn as quickly as you can to face the opposite direction and stop. (3) Normal - Pivot turns safely within 3 seconds and stops quickly with no loss of balance  6.   STEP OVER OBSTACLE Instructions: Begin walking at your normal speed. When you come to the shoe box, step over it, not around it, and keep walking. (3) Normal - Is able to step over 2 stacked shoe boxes taped together (22.86 cm [9 in] total height) without changing gait speed; no evidence of imbalance.  7.   GAIT WITH NARROW BASE OF SUPPORT Instructions: Walk on the floor with arms folded across the chest, feet aligned heel to toe in tandem for a distance of 3.6 m [12 ft]. The number of steps taken in a straight line are counted for a maximum of 10 steps. (3) Normal - Is able to ambulate for 10 steps heel to toe with no staggering.  8.   GAIT WITH EYES CLOSED Instructions: Walk at your normal speed from here to the next mark (6 m [20 ft]) with your eyes closed. (2) Mild impairment - Walks 6 m (20 ft), uses assistive device, slower speed, mild gait deviations, deviates 15.24 -25.4 cm (6 -10 in) outside 30.48-cm (12-in) walkway width. Ambulates 6 m (20 ft) in less than 9 seconds but greater than 7 seconds 5.56 seconds; veers mildly to R  9.   AMBULATING BACKWARDS Instructions: Walk backwards until I tell you to  stop (3) Normal - Walks 6 m (20 ft), no assistive devices, good speed, no  evidence for imbalance, normal gait pattern, deviates no more than 15.24 cm (6 in) outside 30.48-cm (12-in) walkway width. 8.22 seconds  10. STEPS Instructions: Walk up these stairs as you would at home (ie, using the rail if necessary). At the top turn around and walk down. (2) Mild impairment-Alternating feet, must use rail.  Total 28/30   Interpretation of scores: Non-Specific Older Adults Cutoff Score: <=22/30 = risk of falls Parkinson's Disease Cutoff score <15/30= fall risk (Hoehn & Yahr 1-4)  Minimally Clinically Important Difference (MCID)  Stroke (acute, subacute, and chronic) = MDC: 4.2 points Vestibular (acute) = MDC: 6 points Community Dwelling Older Adults =  MCID: 4 points Parkinson's Disease  =  MDC: 4.3 points  (Academy of Neurologic Physical Therapy (nd). Functional Gait Assessment. Retrieved from https://www.neuropt.org/docs/default-source/cpgs/core-outcome-measures/function-gait-assessment-pocket-guide-proof9-(2).pdf?sfvrsn=b65f35043_0.)  PATIENT SURVEYS:  TBA                                                                                                                              TREATMENT DATE: 02/07/24  Therapeutic Activity Standing balance (issued HEP based on standing balance activities; see HEP details below): Feet shoulder width apart (firm surface with eyes closed): x30 seconds (easy) Feet together (firm surface): x30 seconds EC; x30 seconds EC with head turns R/L.  Both easy. Feet together with eyes closed: on Airex x30 seconds; on pillow x30 seconds. SBA for safety.  Moderate challenge.  Standing on Airex: R foot forward x30 seconds; L foot forward x30 seconds; eyes open. SBA for safety.  Moderate challenge. Tandem standing (firm surface): R foot forward x30 seconds; L foot forward x30 seconds. SBA for safety.  Moderate challenge. Ambulation: 690 feet; 4# ankle weights B LE's; dual task (naming animal for each letter of alphabet); 1 trip noted when pt  appearing to be concentrating harder on naming animal with a certain letter (CGA for safety during trip--pt able to self correct--but otherwise SBA); HR 81 bpm post activity Toe taps: 12 inch step; 4# ankle weights; 10 reps x3 sets B LE's; no UE support; HR 81 bpm post activity  PATIENT EDUCATION: Education details: HEP; dual tasking. Person educated: Patient Education method: Explanation, Demonstration, Verbal cues, and Handouts Education comprehension: verbalized understanding, returned demonstration, and verbal cues required  HOME EXERCISE PROGRAM:  Access Code: [email or web address] URL: [email or web address] Date: 02/08/2024 Prepared by: Damien Caulk  Exercises - Romberg Stance with Eyes Closed  - 1 x daily - 5 x weekly - 1 sets - 3 reps - 30 seconds hold - Foam Balance Tandem stance  - 1 x daily - 5 x weekly - 1 sets - 3 reps - 30 second hold - Tandem Stance  - 1 x daily - 5 x weekly - 3 sets - 3 reps - 30 second hold  Access Code: [email or web address] URL: [email or web address] Date: 02/01/2024 Prepared by: Damien Caulk  Exercises - Standing Single Leg  Stance with Counter Support  - 1-2 x daily - 7 x weekly - 2 sets - 1 reps - 30 second hold  GOALS: Goals reviewed with patient? Yes  LONG TERM GOALS: Target date: 02/29/2024 (STG = LTG d/t length of POC)  Pt will be independent with HEP in order to improve strength and balance in order to decrease fall risk and improve function at home for ADL's. Baseline: Issued initial HEP 02/01/24 Goal status: INITIAL  2.  Patient will deny any falls over past 2 weeks to demonstrate improved safety awareness at home and community. Baseline: h/o fall in Sept and intermittent tripping Goal status: INITIAL  3.  Pt will increase by at least 0.13 m/s in order to demonstrate clinically significant improvement in community ambulation.  Baseline: 1.07 m/second on Eval Goal status: INITIAL  4.  Pt will report improved foot  clearance (decreased reports of tripping) for safety with gait. Baseline:  intermittent tripping Goal status: INITIAL  ASSESSMENT:  CLINICAL IMPRESSION: Patient was seen today for physical therapy treatment to address balance and dual tasking.  Focused session on issuing HEP for balance, dual tasking walking with ankle weights (pt tripped 1x when dual tasking/concentrating harder on activity but able to self correct), and toe taps to 12 inch step (with ankle weights) to improve LE step length and foot clearance.  Pt demonstrates appropriate understanding of HEP and issued handout.  They would continue to benefit from skilled PT to address impairments as noted, work on dual tasking with activities, and progress towards long term goals.  OBJECTIVE IMPAIRMENTS: Abnormal gait, decreased balance, decreased knowledge of condition, decreased knowledge of use of DME, difficulty walking, and pain.   ACTIVITY LIMITATIONS: stairs and locomotion level  PARTICIPATION LIMITATIONS: cleaning, laundry, shopping, community activity, occupation, and yard work  PERSONAL FACTORS: Past/current experiences and 1 comorbidity: R ankle fx s/p sx are also affecting patient's functional outcome.   REHAB POTENTIAL: Good  CLINICAL DECISION MAKING: Stable/uncomplicated  EVALUATION COMPLEXITY: Low  PLAN:  PT FREQUENCY: 1x/week  PT DURATION: 4 weeks  PLANNED INTERVENTIONS: [id number]- PT Re-evaluation, 97750- Physical Performance Testing, 97110-Therapeutic exercises, 97530- Therapeutic activity, [id number]- Neuromuscular re-education, 97535- Self Care, [id number]- Manual therapy, [id number]- Gait training, [phone]- Orthotic Initial, [phone]- Orthotic/Prosthetic subsequent, [phone]- Aquatic Therapy, [phone]- Electrical stimulation (manual), [phone]- Ultrasound, Patient/Family education, Balance training, Stair training, Taping, Joint mobilization, Spinal mobilization, Vestibular training, DME instructions, Cryotherapy, and Moist heat  PLAN FOR NEXT  SESSION: Check on HEP (for balance); improve B LE foot clearance; dual tasking with walking and balance; Blaze pods for dual tasking   [employer], PT 02/08/2024, 4:50 PM

## 2024-02-13 ENCOUNTER — Ambulatory Visit: Payer: Commercial Managed Care - PPO | Admitting: Hematology and Oncology

## 2024-02-15 ENCOUNTER — Ambulatory Visit: Admitting: Physical Therapy

## 2024-02-17 ENCOUNTER — Ambulatory Visit (INDEPENDENT_AMBULATORY_CARE_PROVIDER_SITE_OTHER)

## 2024-02-17 VITALS — BP 126/87 | HR 76 | Temp 98.1°F | Resp 16 | Ht 64.0 in | Wt 187.0 lb

## 2024-02-17 DIAGNOSIS — M810 Age-related osteoporosis without current pathological fracture: Secondary | ICD-10-CM

## 2024-02-17 MED ORDER — ROMOSOZUMAB-AQQG 105 MG/1.17ML ~~LOC~~ SOSY
210.0000 mg | PREFILLED_SYRINGE | Freq: Once | SUBCUTANEOUS | Status: AC
  Administered 2024-02-17: 210 mg via SUBCUTANEOUS

## 2024-02-17 NOTE — Progress Notes (Signed)
 Diagnosis: Osteoporosis  Provider:  Mannam, Praveen MD  Procedure: Injection  Evenity  (Romosozumab -aqqg), Dose: 210 mg, Site: subcutaneous, Number of injections: 1  Injection Site(s): Left lower quad. abdomen and Right lower quad. abdomne  Post Care: right and left lower quad abdominal injections  Discharge: Condition: Good, Destination: Home . AVS Declined  Performed by:  Maximiano JONELLE Pouch, LPN

## 2024-02-20 ENCOUNTER — Inpatient Hospital Stay: Attending: Hematology and Oncology | Admitting: Hematology and Oncology

## 2024-02-20 VITALS — BP 127/67 | HR 87 | Temp 97.8°F | Resp 18 | Ht 64.0 in | Wt 185.1 lb

## 2024-02-20 DIAGNOSIS — C50411 Malignant neoplasm of upper-outer quadrant of right female breast: Secondary | ICD-10-CM | POA: Diagnosis not present

## 2024-02-20 DIAGNOSIS — Z170 Estrogen receptor positive status [ER+]: Secondary | ICD-10-CM

## 2024-02-20 MED ORDER — TAMOXIFEN CITRATE 20 MG PO TABS: 20.0000 mg | ORAL_TABLET | Freq: Every day | ORAL | 4 refills | Status: AC

## 2024-02-20 MED ORDER — VENLAFAXINE HCL ER 37.5 MG PO CP24: 37.5000 mg | ORAL_CAPSULE | Freq: Every day | ORAL | 4 refills | Status: AC

## 2024-02-20 NOTE — Progress Notes (Signed)
 Patient Care Team: [name], MD as PCP - General (Internal Medicine) Gail Favorite, MD as Consulting Physician (General Surgery) Izell Domino, MD as Attending Physician (Radiation Oncology) Cary Doffing, MD as Consulting Physician (Dermatology) Donnald Charleston, MD as Consulting Physician (Gastroenterology) Leva Rush, MD as Consulting Physician (Obstetrics and Gynecology) Crawford Morna Pickle, NP as Nurse Practitioner (Hematology and Oncology)  DIAGNOSIS:  Encounter Diagnosis  Name Primary?   Malignant neoplasm of upper-outer quadrant of right breast in female, estrogen receptor positive (HCC) Yes    SUMMARY OF ONCOLOGIC HISTORY: Oncology History  Malignant neoplasm of upper-outer quadrant of right breast in female, estrogen receptor positive (HCC)  01/05/2016 Initial Biopsy   Right breast upper outer quadrant biopsy: IDC, grade 1, ER+(100%), PR+(30%), Ki-67 10%, HER-2 negative (ratio 0.72).    01/26/2016 Initial Diagnosis   Breast cancer of upper-outer quadrant of right female breast (HCC)   02/10/2016 Surgery   Right lumpectomy Warden): IDC, DCIS, 1.7cm, grade 1, margins negative, 3 SLN negative   02/10/2016 Oncotype testing   14/9%, low risk   03/02/2016 Genetic Testing   Patient has genetic testing done for a personal and family history of breast cancer and family history of ovarian cancer. Results revealed patient has the following mutation(s): No deleterious mutations found on the [facility] panel.  The [facility] gene panel offered by [employer] includes sequencing and deletion/duplication testing of the following 27 genes: APC, ATM, BARD1, BMPR1A, BRCA1, BRCA2, BRIP1, CHD1, CDK4, CDKN2A, CHEK2, EPCAM (large rearrangement only), MLH1, MSH2, MSH6, MUTYH, NBN, PALB2, PMS2, PTEN, RAD51C, RAD51D, SMAD4, STK11, and TP53. Sequencing was performed for select regions of POLE and POLD1, and large rearrangement analysis was performed for select  regions of GREM1. The report date is March 02, 2016.   02/2016 -  Anti-estrogen oral therapy   Tamoxifen  daily   04/20/2016 - 05/17/2016 Radiation Therapy   Adjuvant radiation Audry): 1. The Right breast was treated to 40.05 Gy in 15 fractions at 2.67 Gy per fraction.  2. The Right breast was boosted to 10 Gy in 5 fractions at 2 Gy per fraction.     CHIEF COMPLIANT:   HISTORY OF PRESENT ILLNESS: History of Present Illness Julie Mack is a 68 year old female with breast cancer on tamoxifen  who presents for a follow-up visit.  She experiences moderate hot flashes, particularly troublesome in the summer, and is taking venlafaxine  to manage these symptoms associated with tamoxifen  use. Her recent mammogram on October 14th showed a breast density of category B, a change from category C in 2021. She continues on tamoxifen  and Effexor . No new lumps, bumps, or other concerns.     ALLERGIES:  is allergic to gluten meal and reclast  [zoledronic  acid].  MEDICATIONS:  Current Outpatient Medications  Medication Sig Dispense Refill   cholecalciferol (VITAMIN D3) 25 MCG (1000 UT) tablet Take 1,000 Units by mouth daily.     Coenzyme Q10 (CO Q 10 PO) Take by mouth.     Probiotic Product (PROBIOTIC PO) Take by mouth.     Romosozumab -aqqg (EVENITY ) 105 MG/1. SOSY injection Inject 210 mg into the skin every 30 (thirty) days.     rosuvastatin (CRESTOR) 10 MG tablet 1 tablet Orally Once a day for 90 days     tamoxifen  (NOLVADEX ) 20 MG tablet Take 1 tablet (20 mg total) by mouth daily. 90 tablet 4   valACYclovir (VALTREX) 1000 MG tablet Take 1,000 mg by mouth 2 (two) times daily. 01/08/15 as needed for  pain     venlafaxine  XR (EFFEXOR -XR) 37.5 MG 24 hr capsule Take 1 capsule (37.5 mg total) by mouth daily with breakfast. 90 capsule 4   No current facility-administered medications for this visit.    PHYSICAL EXAMINATION: ECOG PERFORMANCE STATUS: 1 - Symptomatic but completely  ambulatory  Vitals:   02/20/24 1451  BP: 127/67  Pulse: 87  Resp: 18  Temp: 97.8 F (36.6 C)  SpO2: 98%   Filed Weights   02/20/24 1451  Weight: 185 lb 1.6 oz (84 kg)    Physical Exam   (exam performed in the presence of a chaperone)  LABORATORY DATA:  I have reviewed the data as listed    Latest Ref Rng & Units 01/26/2021    8:40 AM 01/28/2020   10:18 AM 01/18/2018   10:31 AM  CMP  Glucose 70 - 99 mg/dL 882  894  91   BUN 8 - 23 mg/dL 14  17  13    Creatinine 0.44 - 1.00 mg/dL 9.39  9.36  9.34   Sodium 135 - 145 mmol/L 136  141  141   Potassium 3.5 - 5.1 mmol/L 4.3  4.7  4.5   Chloride 98 - 111 mmol/L 106  108  103   CO2 22 - 32 mmol/L 24  27  30    Calcium 8.9 - 10.3 mg/dL 8.7  9.3  9.8   Total Protein 6.5 - 8.1 g/dL 7.0  6.6  7.2   Total Bilirubin 0.3 - 1.2 mg/dL 0.5  0.4  0.3   Alkaline Phos 38 - 126 U/L 57  58  46   AST 15 - 41 U/L 22  15  17    ALT 0 - 44 U/L 25  19  26      Lab Results  Component Value Date   WBC 5.9 01/26/2021   HGB 13.0 01/26/2021   HCT 39.5 01/26/2021   MCV 88.0 01/26/2021   PLT 195 01/26/2021   NEUTROABS 3.6 01/26/2021    ASSESSMENT & PLAN:  Malignant neoplasm of upper-outer quadrant of right breast in female, estrogen receptor positive (HCC) 1.  02/10/2016: Right lumpectomy T1CN0 stage Ia grade 1 IDC margins negative, ER/PR positive HER2 negative Ki-67 10%, Oncotype score 14 (9% risk of recurrence) 2. adjuvant radiation 04/20/2016-05/17/2016 3.  Genetics: Negative however she is felt to be high risk based on family history to require annual breast MRIs   Current treatment: Tamoxifen  started 02/04/2016 x 10 years Tamoxifen  toxicities: Tolerating tamoxifen  fairly well without any major problems or concerns.   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  She went to [place] with her son on multiple hikes.   Breast cancer surveillance: 1.  Breast exam 02/20/2024: Benign 2. mammogram 01/17/2024:  Benign breast density category B 3.  Breast MRI 08/04/2022: Right chest wall rib abnormality 4.  CT chest 08/19/2022: The right chest wall enhancement noted on MRI corresponds to a healing nondisplaced fracture of the right fourth rib.  No findings suggestive of metastatic disease.  Hepatic steatosis.   Return to clinic in 1 year for follow-up ------------------------------------- Assessment and Plan Assessment & Plan Estrogen receptor positive breast cancer status post right upper-outer quadrant resection, on adjuvant tamoxifen  therapy Continues on tamoxifen  with no adherence issues. Mammogram shows category B density, likely due to tamoxifen  and age. No recurrence evidence. - Refilled tamoxifen  prescription at [facility]. - Continue annual mammograms.  Hot flashes secondary to tamoxifen  therapy, managed  with venlafaxine  Moderate hot flashes managed with venlafaxine . Discussed tapering off venlafaxine  when ready. - Refilled venlafaxine  prescription at [facility]. - Plan to taper off venlafaxine  when ready.       No orders of the defined types were placed in this encounter.  The patient has a good understanding of the overall plan. she agrees with it. she will call with any problems that may develop before the next visit here.  I personally spent a total of 30 minutes in the care of the patient today including preparing to see the patient, getting/reviewing separately obtained history, performing a medically appropriate exam/evaluation, counseling and educating, placing orders, referring and communicating with other health care professionals, documenting clinical information in the EHR, independently interpreting results, communicating results, and coordinating care.   Viinay K Keyon Winnick, MD 02/20/24

## 2024-02-20 NOTE — Assessment & Plan Note (Signed)
 1.  02/10/2016: Right lumpectomy T1CN0 stage Ia grade 1 IDC margins negative, ER/PR positive HER2 negative Ki-67 10%, Oncotype score 14 (9% risk of recurrence) 2. adjuvant radiation 04/20/2016-05/17/2016 3.  Genetics: Negative however she is felt to be high risk based on family history to require annual breast MRIs   Current treatment: Tamoxifen  started 02/04/2016 x 10 years Tamoxifen  toxicities: Tolerating tamoxifen  fairly well without any major problems or concerns.   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  She went to [place] with her son on multiple hikes.   Breast cancer surveillance: 1.  Breast exam 02/20/2024: Benign 2. mammogram 01/17/2024: Benign breast density category B 3.  Breast MRI 08/04/2022: Right chest wall rib abnormality 4.  CT chest 08/19/2022: The right chest wall enhancement noted on MRI corresponds to a healing nondisplaced fracture of the right fourth rib.  No findings suggestive of metastatic disease.  Hepatic steatosis.   Return to clinic in 1 year for follow-up

## 2024-02-22 ENCOUNTER — Ambulatory Visit: Admitting: Physical Therapy

## 2024-02-22 ENCOUNTER — Encounter: Payer: Self-pay | Admitting: Physical Therapy

## 2024-02-22 DIAGNOSIS — R2689 Other abnormalities of gait and mobility: Secondary | ICD-10-CM

## 2024-02-22 DIAGNOSIS — Z9181 History of falling: Secondary | ICD-10-CM

## 2024-02-22 NOTE — Therapy (Signed)
 OUTPATIENT PHYSICAL THERAPY NEURO TREATMENT   Patient Name: Julie Mack MRN: [id number] DOB:10-10-1955, 68 y.o., female Today's Date: 02/22/2024   PCP: Julie Elsie JONETTA Mickey., MD REFERRING PROVIDER: [name], MD  END OF SESSION:  PT End of Session - 02/22/24 1532     Visit Number 3    Number of Visits 5   4 visits plus Eval   Date for Recertification  03/16/24   For scheduling delays   Authorization Type UMR/UHC    PT Start Time 1531    PT Stop Time 1617    PT Time Calculation (min) 46 min    Equipment Utilized During Treatment Gait belt    Activity Tolerance Patient tolerated treatment well    Behavior During Therapy [facility] for tasks assessed/performed          Past Medical History:  Diagnosis Date   Asthma    allergy induced asthma as child, none now   Breast cancer (HCC) 01/2016   right breast cancer   Family history of breast cancer    Family history of ovarian cancer    Headache    History of radiation therapy 04/20/16- 05/17/16   Right Breast 40.05 Gy in 15 fractions and Right Breast boost 10 Gy in 5 fractions.    Melanoma (HCC)    in situ   Osteoporosis    Personal history of radiation therapy 2017   Past Surgical History:  Procedure Laterality Date   ANKLE FRACTURE SURGERY Right 2015   BREAST LUMPECTOMY Right 02/10/2016   BREAST LUMPECTOMY WITH RADIOACTIVE SEED AND SENTINEL LYMPH NODE BIOPSY Right 02/10/2016   Procedure: RIGHT BREAST LUMPECTOMY WITH RADIOACTIVE SEED AND RIGHT SENTINEL LYMPH NODE BIOPSY WITH INJECT BLUE DYE;  Surgeon: Elon Pacini, MD;  Location: [facility];  Service: General;  Laterality: Right;   DILATION AND CURETTAGE OF UTERUS     ECTOPIC PREGNANCY SURGERY  1996   EYE SURGERY  1967   cyst removed   TONSILLECTOMY  1966   Patient Active Problem List   Diagnosis Date Noted   Cardiac murmur 07/27/2023   Osteoporosis 08/05/2022   Osteoarthritis 08/05/2022   Hyperlipidemia 03/19/2022   Family history of  heart disease 03/19/2022   Elevated coronary artery calcium score 03/19/2022   Muscle tension dysphonia 02/18/2021   Hoarseness 02/18/2021   Chronic cough 02/18/2021   Left wrist pain 05/23/2020   Laryngospasms 11/14/2018   Pain in joint of left shoulder 04/09/2017   Closed fracture of proximal end of left humerus 04/09/2017   Genetic testing 03/05/2016   Family history of breast cancer    Family history of ovarian cancer    Malignant neoplasm of upper-outer quadrant of right breast in female, estrogen receptor positive (HCC) 01/26/2016   Infiltrating ductal carcinoma of female breast (HCC) 01/05/2016    ONSET DATE: 01/27/2024 (Date of referral)  REFERRING DIAG: R26.81 (ICD-10-CM) - Unsteadiness on feet  THERAPY DIAG:  Other abnormalities of gait and mobility  History of falling  Rationale for Evaluation and Treatment: Rehabilitation  SUBJECTIVE:  SUBJECTIVE STATEMENT: Pt reports no tripping this past week.  Still working out 3x's per week.  No acute changes.  Pt reported previous therapy session was enlightening regarding distractions with activities (and tripping).  Pt reports no questions with HEP. Pt accompanied by: self  PERTINENT HISTORY: PT referral for unsteady gait and recent fall.  PMH includes R ankle fx s/p sx 2015, R breast CA s/p lumpectomy, OA, tubular adenoma, recurrent facial neuralgia, R shoulder fx, rhinoplasty.  PAIN:  Are you having pain? Yes: NPRS scale: 0/10 currently Pain location: L foot pain Pain description: sharp; cramping; intermittent for past year Aggravating factors: wearing wrong shoes; swimming or at gym; cramp at night Relieving factors: nothing  PRECAUTIONS: Fall and Other: recent B pinky fx's beginning Sept 2025; no BP R UE  RED  FLAGS: None   WEIGHT BEARING RESTRICTIONS: Recent pinky finger fx's B--pt reports no restrictions anymore  FALLS: Has patient fallen in last 6 months? Yes. Number of falls 1 fall hiking; trips but doesn't usually fall  LIVING ENVIRONMENT: Lives with: lives with their spouse Lives in: House/apartment 2 level (lives in [place] on 2nd floor) Stairs: Yes: External: 20 steps; on right going up, on left going up, and can reach both Has following equipment at home: Grab bars  PLOF: Independent  PATIENT GOALS: To get my husband to stop saying why are you tripping.  To stop catching foot when walking.  OBJECTIVE:  Note: Objective measures were completed at Evaluation unless otherwise noted.  DIAGNOSTIC FINDINGS: Pt reports no recent imaging.  COGNITION: Overall cognitive status: Within functional limits for tasks assessed   SENSATION: Light touch intact B LE's  COORDINATION: Intact B LE rapid alternating toe taps and heel to shin in sitting   POSTURE: rounded shoulders, forward head, and posterior pelvic tilt  LOWER EXTREMITY ROM:     Active  Right Eval Left Eval  Hip flexion WNL WNL  Hip extension    Hip abduction    Hip adduction    Hip internal rotation    Hip external rotation    Knee flexion WNL WNL  Knee extension WNL WNL  Ankle dorsiflexion    Ankle plantarflexion WNL WNL  Ankle inversion    Ankle eversion     (Blank rows = not tested)  LOWER EXTREMITY MMT:    MMT Right Eval Left Eval  Hip flexion 5/5 5/5  Hip extension    Hip abduction    Hip adduction    Hip internal rotation    Hip external rotation    Knee flexion 5/5 5/5  Knee extension 5/5 5/5  Ankle dorsiflexion 5/5 5/5  Ankle plantarflexion At least 3/5 AROM At least 3/5 AROM  Ankle inversion    Ankle eversion    (Blank rows = not tested)  BED MOBILITY: Independent   TRANSFERS: Independent   STAIRS: Findings: Level of Assistance: Modified independence, Stair Negotiation  Technique: Forwards with Bilateral Rails, Number of Stairs: 4, Height of Stairs: 6 inch   , and Comments: pt appearing more cautious but is steady GAIT: Findings: Gait Characteristics: WFL and step to pattern, Distance walked: clinic distances, Assistive device utilized:None, Level of assistance: Complete Independence, and Comments: steady ambulation; no tripping noted during session  FUNCTIONAL TESTS:  10 meter walk test: 1.07 m/second (9.34 seconds; no AD use) Functional gait assessment: 28/30 on 02/01/24 FUNCTIONAL GAIT ASSESSMENT  Date: 02/01/24 Score  GAIT LEVEL SURFACE Instructions: Walk at your normal speed from here to the next mark (6  m) [20 ft]. (3) Normal - Walks 6 m (20 ft) in less than 5.5 seconds, no assistive devices, good speed, no evidence for imbalance, normal gait pattern, deviates no more than 15.24 cm (6 in) outside of the 30.48-cm (12-in) walkway width. 4.06 seconds  2.   CHANGE IN GAIT SPEED Instructions: Begin walking at your normal pace (for 1.5 m [5 ft]). When I tell you "go," walk as fast as you can (for 1.5 m [5 ft]). When I tell you "slow," walk as slowly as you can (for 1.5 m [5 ft]. (3) Normal - Able to smoothly change walking speed without loss of balance or gait deviation. Shows a significant difference in walking speeds between normal, fast, and slow speeds. Deviates no more than 15.24 cm (6 in) outside of the 30.48-cm (12-in) walkway width.  3.    GAIT WITH HORIZONTAL HEAD TURNS Instructions: Walk from here to the next mark 6 m (20 ft) away. Begin walking at your normal pace. Keep walking straight; after 3 steps, turn your head to the right and keep walking straight while looking to the right. After 3 more steps, turn your head to the left and keep walking straight while looking left. Continue alternating looking right and left. (3) Normal - Performs head turns smoothly with no change in gait. Deviates no more than 15.24 cm (6 in) outside 30.48-cm (12-in) walkway  width.  4.   GAIT WITH VERTICAL HEAD TURNS Instructions: Walk from here to the next mark (6 m [20 ft]). Begin walking at your normal pace. Keep walking straight; after 3 steps, tip your head up and keep walking straight while looking up. After 3 more steps, tip your head down, keep walking straight while looking down. Continue  alternating looking up and down every 3 steps until you have completed 2 repetitions in each direction. (3) Normal - Performs head turns with no change in gait. Deviates no more than 15.24 cm (6 in) outside 30.48-cm (12-in) walkway width.  5.  GAIT AND PIVOT TURN Instructions: Begin with walking at your normal pace. When I tell you, "turn and stop," turn as quickly as you can to face the opposite direction and stop. (3) Normal - Pivot turns safely within 3 seconds and stops quickly with no loss of balance  6.   STEP OVER OBSTACLE Instructions: Begin walking at your normal speed. When you come to the shoe box, step over it, not around it, and keep walking. (3) Normal - Is able to step over 2 stacked shoe boxes taped together (22.86 cm [9 in] total height) without changing gait speed; no evidence of imbalance.  7.   GAIT WITH NARROW BASE OF SUPPORT Instructions: Walk on the floor with arms folded across the chest, feet aligned heel to toe in tandem for a distance of 3.6 m [12 ft]. The number of steps taken in a straight line are counted for a maximum of 10 steps. (3) Normal - Is able to ambulate for 10 steps heel to toe with no staggering.  8.   GAIT WITH EYES CLOSED Instructions: Walk at your normal speed from here to the next mark (6 m [20 ft]) with your eyes closed. (2) Mild impairment - Walks 6 m (20 ft), uses assistive device, slower speed, mild gait deviations, deviates 15.24 -25.4 cm (6 -10 in) outside 30.48-cm (12-in) walkway width. Ambulates 6 m (20 ft) in less than 9 seconds but greater than 7 seconds 5.56 seconds; veers mildly to R  9.  AMBULATING BACKWARDS Instructions:  Walk backwards until I tell you to stop (3) Normal - Walks 6 m (20 ft), no assistive devices, good speed, no evidence for imbalance, normal gait pattern, deviates no more than 15.24 cm (6 in) outside 30.48-cm (12-in) walkway width. 8.22 seconds  10. STEPS Instructions: Walk up these stairs as you would at home (ie, using the rail if necessary). At the top turn around and walk down. (2) Mild impairment-Alternating feet, must use rail.  Total 28/30   Interpretation of scores: Non-Specific Older Adults Cutoff Score: <=22/30 = risk of falls Parkinson's Disease Cutoff score <15/30= fall risk (Hoehn & Yahr 1-4)  Minimally Clinically Important Difference (MCID)  Stroke (acute, subacute, and chronic) = MDC: 4.2 points Vestibular (acute) = MDC: 6 points Community Dwelling Older Adults =  MCID: 4 points Parkinson's Disease  =  MDC: 4.3 points  (Academy of Neurologic Physical Therapy (nd). Functional Gait Assessment. Retrieved from https://www.neuropt.org/docs/default-source/cpgs/core-outcome-measures/function-gait-assessment-pocket-guide-proof9-(2).pdf?sfvrsn=b80f35043_0.)  PATIENT SURVEYS:  TBA                                                                                                                              TREATMENT DATE: 02/22/24  Therapeutic activity: 4 Blaze pods on random one color taps setting for improved balance.  Performed on 1 minute intervals with 1 minute rest periods.  [place] black resistance band around pelvis for resistance/perturbations.  Pt requires SBA guarding. Round 1:  semi-circle setup.  27 hits. Round 2:  semi-circle setup.  28 hits. Round 3:  semi-circle setup.  29 hits. Notable errors/deficits:  improved balance noted with repetition 4 Blaze pods on random one color taps setting for improved foot clearance.  Performed on 1 minute intervals with 1 minute rest periods.  4# ankle weights B ankles.  Performed at stairs.  No UE support.  Pt requires CGA  guarding. Round 1:  2 blaze pods on 6 inch step and 2 blaze pods on 12 inch step setup.  45 hits. Round 2:  2 blaze pods on 6 inch step and 2 blaze pods on 12 inch step  setup.  46 hits. Round 3:  2 blaze pods on 6 inch step and 2 blaze pods on 12 inch step  setup.  50 hits. Notable errors/deficits:  Occasionally catching feet on 6 inch step (to get to 12 inch step) but able to self correct.  96 bpm HR after sitting rest. Hurdles: x6 feet; alternating stepping over 9.5 inch and 6.5 inch hurdles (2 of each height hurdle on each side; 2 rows of hurdles next to each other) x2 trials alternating stepping over with B LE's with 4# B ankle weights x14 feet; alternating stepping over 9.5 inch and 6.5 inch hurdles (4 of each height hurdle in one row) with 4# B ankle weights x3 trials R LE only x3 trials L LE only   PATIENT EDUCATION: Education details: Continue HEP.  Importance of endurance training for muscles (for foot clearance  to prevent tripping). Person educated: Patient Education method: Explanation, Demonstration, Verbal cues, and Handouts Education comprehension: verbalized understanding, returned demonstration, and verbal cues required  HOME EXERCISE PROGRAM:  Access Code: [email or web address] URL: [email or web address] Date: 02/08/2024 Prepared by: Damien Caulk  Exercises - Romberg Stance with Eyes Closed  - 1 x daily - 5 x weekly - 1 sets - 3 reps - 30 seconds hold - Foam Balance Tandem stance  - 1 x daily - 5 x weekly - 1 sets - 3 reps - 30 second hold - Tandem Stance  - 1 x daily - 5 x weekly - 3 sets - 3 reps - 30 second hold  Access Code: [email or web address] URL: [email or web address] Date: 02/01/2024 Prepared by: Damien Caulk  Exercises - Standing Single Leg Stance with Counter Support  - 1-2 x daily - 7 x weekly - 2 sets - 1 reps - 30 second hold  GOALS: Goals reviewed with patient? Yes  LONG TERM GOALS: Target date: 02/29/2024 (STG = LTG d/t length of  POC)  Pt will be independent with HEP in order to improve strength and balance in order to decrease fall risk and improve function at home for ADL's. Baseline: Issued initial HEP 02/01/24 Goal status: INITIAL  2.  Patient will deny any falls over past 2 weeks to demonstrate improved safety awareness at home and community. Baseline: h/o fall in Sept and intermittent tripping Goal status: INITIAL  3.  Pt will increase by at least 0.13 m/s in order to demonstrate clinically significant improvement in community ambulation.  Baseline: 1.07 m/second on Eval Goal status: INITIAL  4.  Pt will report improved foot clearance (decreased reports of tripping) for safety with gait. Baseline:  intermittent tripping Goal status: INITIAL  ASSESSMENT:  CLINICAL IMPRESSION: Patient was seen today for physical therapy treatment to address balance and ambulation.  Focused session on dual tasking activities with Blaze Pods and increasing B LE step height and length.  Pt catching feet on step at times with Blaze Pods activity at stairs (with ankle weights) but pt able to self correct balance.  Pt demonstrated improved reaction time with dual tasking activities with repetition.  They would continue to benefit from skilled PT to address impairments as noted and progress towards long term goals.  OBJECTIVE IMPAIRMENTS: Abnormal gait, decreased balance, decreased knowledge of condition, decreased knowledge of use of DME, difficulty walking, and pain.   ACTIVITY LIMITATIONS: stairs and locomotion level  PARTICIPATION LIMITATIONS: cleaning, laundry, shopping, community activity, occupation, and yard work  PERSONAL FACTORS: Past/current experiences and 1 comorbidity: R ankle fx s/p sx are also affecting patient's functional outcome.   REHAB POTENTIAL: Good  CLINICAL DECISION MAKING: Stable/uncomplicated  EVALUATION COMPLEXITY: Low  PLAN:  PT FREQUENCY: 1x/week  PT DURATION: 4 weeks  PLANNED  INTERVENTIONS: [id number]- PT Re-evaluation, 97750- Physical Performance Testing, 97110-Therapeutic exercises, 97530- Therapeutic activity, [id number]- Neuromuscular re-education, 97535- Self Care, [id number]- Manual therapy, [id number]- Gait training, [phone]- Orthotic Initial, [phone]- Orthotic/Prosthetic subsequent, [phone]- Aquatic Therapy, [phone]- Electrical stimulation (manual), [phone]- Ultrasound, Patient/Family education, Balance training, Stair training, Taping, Joint mobilization, Spinal mobilization, Vestibular training, DME instructions, Cryotherapy, and Moist heat  PLAN FOR NEXT SESSION: Check on HEP (for balance); improve B LE foot clearance; dual tasking with walking and balance; Blaze pods for dual tasking   [employer], PT 02/22/2024, 8:15 PM

## 2024-02-29 ENCOUNTER — Ambulatory Visit: Admitting: Physical Therapy

## 2024-02-29 ENCOUNTER — Encounter: Payer: Self-pay | Admitting: Physical Therapy

## 2024-02-29 DIAGNOSIS — R2689 Other abnormalities of gait and mobility: Secondary | ICD-10-CM | POA: Diagnosis not present

## 2024-02-29 DIAGNOSIS — Z9181 History of falling: Secondary | ICD-10-CM

## 2024-02-29 NOTE — Therapy (Signed)
 OUTPATIENT PHYSICAL THERAPY NEURO TREATMENT   Patient Name: Julie Mack MRN: [id number] DOB:07/14/1955, 68 y.o., female Today's Date: 02/29/2024   PCP: [name], MD REFERRING PROVIDER: [name], MD  END OF SESSION:  PT End of Session - 02/29/24 1240     Visit Number 4    Number of Visits 5   4 visits plus Eval   Date for Recertification  03/16/24   For scheduling delays   Authorization Type UMR/UHC    PT Start Time 1238   pt arrived late d/t traffic   PT Stop Time 1316    PT Time Calculation (min) 38 min    Equipment Utilized During Treatment Gait belt    Activity Tolerance Patient tolerated treatment well    Behavior During Therapy [facility] for tasks assessed/performed          Past Medical History:  Diagnosis Date   Asthma    allergy induced asthma as child, none now   Breast cancer (HCC) 01/2016   right breast cancer   Family history of breast cancer    Family history of ovarian cancer    Headache    History of radiation therapy 04/20/16- 05/17/16   Right Breast 40.05 Gy in 15 fractions and Right Breast boost 10 Gy in 5 fractions.    Melanoma (HCC)    in situ   Osteoporosis    Personal history of radiation therapy 2017   Past Surgical History:  Procedure Laterality Date   ANKLE FRACTURE SURGERY Right 2015   BREAST LUMPECTOMY Right 02/10/2016   BREAST LUMPECTOMY WITH RADIOACTIVE SEED AND SENTINEL LYMPH NODE BIOPSY Right 02/10/2016   Procedure: RIGHT BREAST LUMPECTOMY WITH RADIOACTIVE SEED AND RIGHT SENTINEL LYMPH NODE BIOPSY WITH INJECT BLUE DYE;  Surgeon: Elon Pacini, MD;  Location: [facility];  Service: General;  Laterality: Right;   DILATION AND CURETTAGE OF UTERUS     ECTOPIC PREGNANCY SURGERY  1996   EYE SURGERY  1967   cyst removed   TONSILLECTOMY  1966   Patient Active Problem List   Diagnosis Date Noted   Cardiac murmur 07/27/2023   Osteoporosis 08/05/2022   Osteoarthritis 08/05/2022   Hyperlipidemia  03/19/2022   Family history of heart disease 03/19/2022   Elevated coronary artery calcium score 03/19/2022   Muscle tension dysphonia 02/18/2021   Hoarseness 02/18/2021   Chronic cough 02/18/2021   Left wrist pain 05/23/2020   Laryngospasms 11/14/2018   Pain in joint of left shoulder 04/09/2017   Closed fracture of proximal end of left humerus 04/09/2017   Genetic testing 03/05/2016   Family history of breast cancer    Family history of ovarian cancer    Malignant neoplasm of upper-outer quadrant of right breast in female, estrogen receptor positive (HCC) 01/26/2016   Infiltrating ductal carcinoma of female breast (HCC) 01/05/2016    ONSET DATE: 01/27/2024 (Date of referral)  REFERRING DIAG: R26.81 (ICD-10-CM) - Unsteadiness on feet  THERAPY DIAG:  Other abnormalities of gait and mobility  History of falling  Rationale for Evaluation and Treatment: Rehabilitation  SUBJECTIVE:  SUBJECTIVE STATEMENT: No acute changes.  No recent falls.  Bought 4#'s ankle weights and has been wearing them around the house.  Doing HEP (no current questions). Pt accompanied by: self  PERTINENT HISTORY: PT referral for unsteady gait and recent fall.  PMH includes R ankle fx s/p sx 2015, R breast CA s/p lumpectomy, OA, tubular adenoma, recurrent facial neuralgia, R shoulder fx, rhinoplasty.  PAIN:  Are you having pain? Yes: NPRS scale: 0/10 currently Pain location: L foot pain Pain description: sharp; cramping; intermittent for past year Aggravating factors: wearing wrong shoes; swimming or at gym; cramp at night Relieving factors: nothing  PRECAUTIONS: Fall and Other: recent B pinky fx's beginning Sept 2025; no BP R UE  RED FLAGS: None   WEIGHT BEARING RESTRICTIONS: Recent pinky finger fx's B--pt  reports no restrictions anymore  FALLS: Has patient fallen in last 6 months? Yes. Number of falls 1 fall hiking; trips but doesn't usually fall  LIVING ENVIRONMENT: Lives with: lives with their spouse Lives in: House/apartment 2 level (lives in [place] on 2nd floor) Stairs: Yes: External: 20 steps; on right going up, on left going up, and can reach both Has following equipment at home: Grab bars  PLOF: Independent  PATIENT GOALS: To get my husband to stop saying why are you tripping.  To stop catching foot when walking.  OBJECTIVE:  Note: Objective measures were completed at Evaluation unless otherwise noted.  DIAGNOSTIC FINDINGS: Pt reports no recent imaging.  COGNITION: Overall cognitive status: Within functional limits for tasks assessed   SENSATION: Light touch intact B LE's  COORDINATION: Intact B LE rapid alternating toe taps and heel to shin in sitting   POSTURE: rounded shoulders, forward head, and posterior pelvic tilt  LOWER EXTREMITY ROM:     Active  Right Eval Left Eval  Hip flexion WNL WNL  Hip extension    Hip abduction    Hip adduction    Hip internal rotation    Hip external rotation    Knee flexion WNL WNL  Knee extension WNL WNL  Ankle dorsiflexion    Ankle plantarflexion WNL WNL  Ankle inversion    Ankle eversion     (Blank rows = not tested)  LOWER EXTREMITY MMT:    MMT Right Eval Left Eval  Hip flexion 5/5 5/5  Hip extension    Hip abduction    Hip adduction    Hip internal rotation    Hip external rotation    Knee flexion 5/5 5/5  Knee extension 5/5 5/5  Ankle dorsiflexion 5/5 5/5  Ankle plantarflexion At least 3/5 AROM At least 3/5 AROM  Ankle inversion    Ankle eversion    (Blank rows = not tested)  BED MOBILITY: Independent   TRANSFERS: Independent   STAIRS: Findings: Level of Assistance: Modified independence, Stair Negotiation Technique: Forwards with Bilateral Rails, Number of Stairs: 4, Height of Stairs: 6 inch    , and Comments: pt appearing more cautious but is steady GAIT: Findings: Gait Characteristics: WFL and step to pattern, Distance walked: clinic distances, Assistive device utilized:None, Level of assistance: Complete Independence, and Comments: steady ambulation; no tripping noted during session  FUNCTIONAL TESTS:  10 meter walk test: 1.07 m/second (9.34 seconds; no AD use) Functional gait assessment: 28/30 on 02/01/24 FUNCTIONAL GAIT ASSESSMENT  Date: 02/01/24 Score  GAIT LEVEL SURFACE Instructions: Walk at your normal speed from here to the next mark (6 m) [20 ft]. (3) Normal - Walks 6 m (20 ft) in less  than 5.5 seconds, no assistive devices, good speed, no evidence for imbalance, normal gait pattern, deviates no more than 15.24 cm (6 in) outside of the 30.48-cm (12-in) walkway width. 4.06 seconds  2.   CHANGE IN GAIT SPEED Instructions: Begin walking at your normal pace (for 1.5 m [5 ft]). When I tell you "go," walk as fast as you can (for 1.5 m [5 ft]). When I tell you "slow," walk as slowly as you can (for 1.5 m [5 ft]. (3) Normal - Able to smoothly change walking speed without loss of balance or gait deviation. Shows a significant difference in walking speeds between normal, fast, and slow speeds. Deviates no more than 15.24 cm (6 in) outside of the 30.48-cm (12-in) walkway width.  3.    GAIT WITH HORIZONTAL HEAD TURNS Instructions: Walk from here to the next mark 6 m (20 ft) away. Begin walking at your normal pace. Keep walking straight; after 3 steps, turn your head to the right and keep walking straight while looking to the right. After 3 more steps, turn your head to the left and keep walking straight while looking left. Continue alternating looking right and left. (3) Normal - Performs head turns smoothly with no change in gait. Deviates no more than 15.24 cm (6 in) outside 30.48-cm (12-in) walkway width.  4.   GAIT WITH VERTICAL HEAD TURNS Instructions: Walk from here to the next mark  (6 m [20 ft]). Begin walking at your normal pace. Keep walking straight; after 3 steps, tip your head up and keep walking straight while looking up. After 3 more steps, tip your head down, keep walking straight while looking down. Continue  alternating looking up and down every 3 steps until you have completed 2 repetitions in each direction. (3) Normal - Performs head turns with no change in gait. Deviates no more than 15.24 cm (6 in) outside 30.48-cm (12-in) walkway width.  5.  GAIT AND PIVOT TURN Instructions: Begin with walking at your normal pace. When I tell you, "turn and stop," turn as quickly as you can to face the opposite direction and stop. (3) Normal - Pivot turns safely within 3 seconds and stops quickly with no loss of balance  6.   STEP OVER OBSTACLE Instructions: Begin walking at your normal speed. When you come to the shoe box, step over it, not around it, and keep walking. (3) Normal - Is able to step over 2 stacked shoe boxes taped together (22.86 cm [9 in] total height) without changing gait speed; no evidence of imbalance.  7.   GAIT WITH NARROW BASE OF SUPPORT Instructions: Walk on the floor with arms folded across the chest, feet aligned heel to toe in tandem for a distance of 3.6 m [12 ft]. The number of steps taken in a straight line are counted for a maximum of 10 steps. (3) Normal - Is able to ambulate for 10 steps heel to toe with no staggering.  8.   GAIT WITH EYES CLOSED Instructions: Walk at your normal speed from here to the next mark (6 m [20 ft]) with your eyes closed. (2) Mild impairment - Walks 6 m (20 ft), uses assistive device, slower speed, mild gait deviations, deviates 15.24 -25.4 cm (6 -10 in) outside 30.48-cm (12-in) walkway width. Ambulates 6 m (20 ft) in less than 9 seconds but greater than 7 seconds 5.56 seconds; veers mildly to R  9.   AMBULATING BACKWARDS Instructions: Walk backwards until I tell you to stop (  3) Normal - Walks 6 m (20 ft), no assistive  devices, good speed, no evidence for imbalance, normal gait pattern, deviates no more than 15.24 cm (6 in) outside 30.48-cm (12-in) walkway width. 8.22 seconds  10. STEPS Instructions: Walk up these stairs as you would at home (ie, using the rail if necessary). At the top turn around and walk down. (2) Mild impairment-Alternating feet, must use rail.  Total 28/30   Interpretation of scores: Non-Specific Older Adults Cutoff Score: <=22/30 = risk of falls Parkinson's Disease Cutoff score <15/30= fall risk (Hoehn & Yahr 1-4)  Minimally Clinically Important Difference (MCID)  Stroke (acute, subacute, and chronic) = MDC: 4.2 points Vestibular (acute) = MDC: 6 points Community Dwelling Older Adults =  MCID: 4 points Parkinson's Disease  =  MDC: 4.3 points  (Academy of Neurologic Physical Therapy (nd). Functional Gait Assessment. Retrieved from https://www.neuropt.org/docs/default-source/cpgs/core-outcome-measures/function-gait-assessment-pocket-guide-proof9-(2).pdf?sfvrsn=b24f35043_0.)  PATIENT SURVEYS:  TBA                                                                                                                              TREATMENT DATE: 02/29/24  Therapeutic Activity: HR taken before activity: 76 bpm (to address LTG's): 7.53 seconds (1.32 m/sec) 5 Blaze pods on random one color taps setting for improved balance.  Performed on 1 minute intervals with 1 minute rest periods.  [place] black resistance band around pelvis for resistance/perturbations.  Pt requires SBA guarding. Round 1:  semi-circle setup.  33 hits. Round 2:  semi-circle setup.  38 hits. Round 3:  semi-circle setup.  39 hits. 5 Blaze pods on random one color taps setting for improved foot clearance.  Performed on 1 minute intervals with 1 minute rest periods.  4# ankle weights B ankles.  Performed at stairs.  No UE support.  Pt requires CGA guarding. Round 1:  2 blaze pods on 6 inch step and 3 blaze pods on 12 inch  step setup.  43 hits. Round 2:  2 blaze pods on 6 inch step and 3 blaze pods on 12 inch step  setup.  47 hits. Round 3:  2 blaze pods on 6 inch step and 3 blaze pods on 12 inch step  setup.  44 hits. Notable errors/deficits:  Occasionally catching of L foot on 6 inch step (to get to 12 inch step) but able to self correct.  92 bpm HR after sitting rest.  PATIENT EDUCATION: Education details: Visit limit (next visit last planned visit).  Reviewed LTG's.  Continue HEP. Person educated: Patient Education method: Explanation, Demonstration, Verbal cues, and Handouts Education comprehension: verbalized understanding, returned demonstration, and verbal cues required  HOME EXERCISE PROGRAM:  Access Code: [email or web address] URL: [email or web address] Date: 02/08/2024 Prepared by: Damien Caulk  Exercises - Romberg Stance with Eyes Closed  - 1 x daily - 5 x weekly - 1 sets - 3 reps - 30 seconds hold - Foam Balance Tandem stance  - 1 x daily - 5 x weekly -  1 sets - 3 reps - 30 second hold - Tandem Stance  - 1 x daily - 5 x weekly - 3 sets - 3 reps - 30 second hold  Access Code: [email or web address] URL: [email or web address] Date: 02/01/2024 Prepared by: Damien Caulk  Exercises - Standing Single Leg Stance with Counter Support  - 1-2 x daily - 7 x weekly - 2 sets - 1 reps - 30 second hold  GOALS: Goals reviewed with patient? Yes  LONG TERM GOALS: Target date: 03/09/2024 (STG = LTG d/t length of POC)  Pt will be independent with HEP in order to improve strength and balance in order to decrease fall risk and improve function at home for ADL's. Baseline: Issued initial HEP 02/01/24 Goal status: MET  2.  Patient will deny any falls over past 2 weeks to demonstrate improved safety awareness at home and community. Baseline: h/o fall in Sept and intermittent tripping Goal status: MET  3.  Pt will increase by at least 0.13 m/s in order to demonstrate clinically significant  improvement in community ambulation.  Baseline: 1.07 m/second on Eval; 1.32 m/sec 02/29/24 Goal status: MET  4.  Pt will report improved foot clearance (decreased reports of tripping) for safety with gait. Baseline:  intermittent tripping Goal status: PARTIALLY MET (less time she catches her foot)  ASSESSMENT:  CLINICAL IMPRESSION: Patient was seen today for physical therapy treatment to address LTG's, gait, and balance.  Focused session on assessing LTG's, dual tasking activities with Blaze Pods, and increasing LE step height and length (for ambulation).  Pt met 3/4 LTG's and partially met 1 LTG (pt does catch foot less with ambulation).  Pt scored 1.32 m/sec on the 10 Meter Walk Test and improved score from 1.07 m/second on Eval (Cut off scores: >0.8 m/s = community Ambulator, >1.2 m/s = crossing a street, <1.0 = increased fall risk); goal met.  Also discussed 1 remaining planned therapy visit (pt would like to review HEP and update as needed).  They would continue to benefit from skilled PT to address impairments as noted and progress towards long term goals.  OBJECTIVE IMPAIRMENTS: Abnormal gait, decreased balance, decreased knowledge of condition, decreased knowledge of use of DME, difficulty walking, and pain.   ACTIVITY LIMITATIONS: stairs and locomotion level  PARTICIPATION LIMITATIONS: cleaning, laundry, shopping, community activity, occupation, and yard work  PERSONAL FACTORS: Past/current experiences and 1 comorbidity: R ankle fx s/p sx are also affecting patient's functional outcome.   REHAB POTENTIAL: Good  CLINICAL DECISION MAKING: Stable/uncomplicated  EVALUATION COMPLEXITY: Low  PLAN:  PT FREQUENCY: 1x/week  PT DURATION: 4 weeks  PLANNED INTERVENTIONS: [id number]- PT Re-evaluation, 97750- Physical Performance Testing, 97110-Therapeutic exercises, 97530- Therapeutic activity, [id number]- Neuromuscular re-education, 97535- Self Care, [id number]- Manual therapy, [id number]- Gait training,  [phone]- Orthotic Initial, [phone]- Orthotic/Prosthetic subsequent, [phone]- Aquatic Therapy, [phone]- Electrical stimulation (manual), [phone]- Ultrasound, Patient/Family education, Balance training, Stair training, Taping, Joint mobilization, Spinal mobilization, Vestibular training, DME instructions, Cryotherapy, and Moist heat  PLAN FOR NEXT SESSION: Discharge from PT (last visit planned).  Check on HEP (add toe taps to steps with ankle weights); improve B LE foot clearance; dual tasking with walking and balance; Blaze pods for dual tasking  [employer], PT 02/29/2024, 8:02 PM

## 2024-03-09 ENCOUNTER — Encounter: Payer: Self-pay | Admitting: Physical Therapy

## 2024-03-09 ENCOUNTER — Ambulatory Visit: Admitting: Physical Therapy

## 2024-03-09 DIAGNOSIS — R2689 Other abnormalities of gait and mobility: Secondary | ICD-10-CM | POA: Diagnosis present

## 2024-03-09 DIAGNOSIS — Z9181 History of falling: Secondary | ICD-10-CM | POA: Diagnosis present

## 2024-03-09 NOTE — Therapy (Signed)
 OUTPATIENT PHYSICAL THERAPY NEURO TREATMENT--DISCHARGE NOTE   PHYSICAL THERAPY DISCHARGE SUMMARY  Visits from Start of Care: 5  Current functional level related to goals / functional outcomes: Independent with ambulation. See below for goals/functional outcomes.   Remaining deficits: Occasional catching of feet when walking (when tired or distracted); pt takes rest breaks when she notices this.   Education / Equipment: Updated HEP handout issued and HEP reviewed with pt.  Pt has ankle weights and foam mat at home to utilize with exercises.   Patient agrees to discharge. Patient goals were mostly met (occasional catching of feet when walking but improved compared to initial evaluation. Patient is being discharged due to being pleased with the current functional level.    Patient Name: Julie Mack MRN: [id number] DOB:Jan 01, 1956, 68 y.o., female Today's Date: 03/09/2024   PCP: [name], MD REFERRING PROVIDER: [name], MD  END OF SESSION:  PT End of Session - 03/09/24 1533     Visit Number 5    Number of Visits 5   4 visits plus Eval   Date for Recertification  03/16/24   For scheduling delays   Authorization Type UMR/UHC    PT Start Time 1531    PT Stop Time 1616    PT Time Calculation (min) 45 min    Equipment Utilized During Treatment Gait belt    Activity Tolerance Patient tolerated treatment well    Behavior During Therapy [facility] for tasks assessed/performed          Past Medical History:  Diagnosis Date   Asthma    allergy induced asthma as child, none now   Breast cancer (HCC) 01/2016   right breast cancer   Family history of breast cancer    Family history of ovarian cancer    Headache    History of radiation therapy 04/20/16- 05/17/16   Right Breast 40.05 Gy in 15 fractions and Right Breast boost 10 Gy in 5 fractions.    Melanoma (HCC)    in situ   Osteoporosis    Personal history of radiation therapy 2017   Past Surgical  History:  Procedure Laterality Date   ANKLE FRACTURE SURGERY Right 2015   BREAST LUMPECTOMY Right 02/10/2016   BREAST LUMPECTOMY WITH RADIOACTIVE SEED AND SENTINEL LYMPH NODE BIOPSY Right 02/10/2016   Procedure: RIGHT BREAST LUMPECTOMY WITH RADIOACTIVE SEED AND RIGHT SENTINEL LYMPH NODE BIOPSY WITH INJECT BLUE DYE;  Surgeon: Elon Pacini, MD;  Location: [facility];  Service: General;  Laterality: Right;   DILATION AND CURETTAGE OF UTERUS     ECTOPIC PREGNANCY SURGERY  1996   EYE SURGERY  1967   cyst removed   TONSILLECTOMY  1966   Patient Active Problem List   Diagnosis Date Noted   Cardiac murmur 07/27/2023   Osteoporosis 08/05/2022   Osteoarthritis 08/05/2022   Hyperlipidemia 03/19/2022   Family history of heart disease 03/19/2022   Elevated coronary artery calcium score 03/19/2022   Muscle tension dysphonia 02/18/2021   Hoarseness 02/18/2021   Chronic cough 02/18/2021   Left wrist pain 05/23/2020   Laryngospasms 11/14/2018   Pain in joint of left shoulder 04/09/2017   Closed fracture of proximal end of left humerus 04/09/2017   Genetic testing 03/05/2016   Family history of breast cancer    Family history of ovarian cancer    Malignant neoplasm of upper-outer quadrant of right breast in female, estrogen receptor positive (HCC) 01/26/2016   Infiltrating ductal carcinoma of female  breast (HCC) 01/05/2016    ONSET DATE: 01/27/2024 (Date of referral)  REFERRING DIAG: R26.81 (ICD-10-CM) - Unsteadiness on feet  THERAPY DIAG:  Other abnormalities of gait and mobility  History of falling  Rationale for Evaluation and Treatment: Rehabilitation  SUBJECTIVE:                                                                                                                                                                                             SUBJECTIVE STATEMENT: No acute changes.  No recent falls.  Ready to graduate today.  Doing HEP.  Occasionally catching  foot (when walking) when tired and having a lot of things going on.  Knows when to rest. Pt accompanied by: self  PERTINENT HISTORY: PT referral for unsteady gait and recent fall.  PMH includes R ankle fx s/p sx 2015, R breast CA s/p lumpectomy, OA, tubular adenoma, recurrent facial neuralgia, R shoulder fx, rhinoplasty.  PAIN:  Are you having pain? Yes: NPRS scale: 0/10 currently Pain location: L foot pain Pain description: sharp; cramping; intermittent for past year Aggravating factors: wearing wrong shoes; swimming or at gym; cramp at night Relieving factors: nothing  PRECAUTIONS: Fall and Other: recent B pinky fx's beginning Sept 2025; no BP R UE  RED FLAGS: None   WEIGHT BEARING RESTRICTIONS: Recent pinky finger fx's B--pt reports no restrictions anymore  FALLS: Has patient fallen in last 6 months? Yes. Number of falls 1 fall hiking; trips but doesn't usually fall  LIVING ENVIRONMENT: Lives with: lives with their spouse Lives in: House/apartment 2 level (lives in [place] on 2nd floor) Stairs: Yes: External: 20 steps; on right going up, on left going up, and can reach both Has following equipment at home: Grab bars  PLOF: Independent  PATIENT GOALS: To get my husband to stop saying why are you tripping.  To stop catching foot when walking.  OBJECTIVE:  Note: Objective measures were completed at Evaluation unless otherwise noted.  DIAGNOSTIC FINDINGS: Pt reports no recent imaging.  COGNITION: Overall cognitive status: Within functional limits for tasks assessed   SENSATION: Light touch intact B LE's  COORDINATION: Intact B LE rapid alternating toe taps and heel to shin in sitting   POSTURE: rounded shoulders, forward head, and posterior pelvic tilt  LOWER EXTREMITY ROM:     Active  Right Eval Left Eval  Hip flexion WNL WNL  Hip extension    Hip abduction    Hip adduction    Hip internal rotation    Hip external rotation    Knee flexion WNL WNL   Knee extension WNL WNL  Ankle dorsiflexion  Ankle plantarflexion WNL WNL  Ankle inversion    Ankle eversion     (Blank rows = not tested)  LOWER EXTREMITY MMT:    MMT Right Eval Left Eval  Hip flexion 5/5 5/5  Hip extension    Hip abduction    Hip adduction    Hip internal rotation    Hip external rotation    Knee flexion 5/5 5/5  Knee extension 5/5 5/5  Ankle dorsiflexion 5/5 5/5  Ankle plantarflexion At least 3/5 AROM At least 3/5 AROM  Ankle inversion    Ankle eversion    (Blank rows = not tested)  BED MOBILITY: Independent   TRANSFERS: Independent   STAIRS: Findings: Level of Assistance: Modified independence, Stair Negotiation Technique: Forwards with Bilateral Rails, Number of Stairs: 4, Height of Stairs: 6 inch   , and Comments: pt appearing more cautious but is steady GAIT: Findings: Gait Characteristics: WFL and step to pattern, Distance walked: clinic distances, Assistive device utilized:None, Level of assistance: Complete Independence, and Comments: steady ambulation; no tripping noted during session  FUNCTIONAL TESTS:  10 meter walk test: 1.07 m/second (9.34 seconds; no AD use) Functional gait assessment: 28/30 on 02/01/24 FUNCTIONAL GAIT ASSESSMENT  Date: 02/01/24 Score  GAIT LEVEL SURFACE Instructions: Walk at your normal speed from here to the next mark (6 m) [20 ft]. (3) Normal - Walks 6 m (20 ft) in less than 5.5 seconds, no assistive devices, good speed, no evidence for imbalance, normal gait pattern, deviates no more than 15.24 cm (6 in) outside of the 30.48-cm (12-in) walkway width. 4.06 seconds  2.   CHANGE IN GAIT SPEED Instructions: Begin walking at your normal pace (for 1.5 m [5 ft]). When I tell you "go," walk as fast as you can (for 1.5 m [5 ft]). When I tell you "slow," walk as slowly as you can (for 1.5 m [5 ft]. (3) Normal - Able to smoothly change walking speed without loss of balance or gait deviation. Shows a significant difference in  walking speeds between normal, fast, and slow speeds. Deviates no more than 15.24 cm (6 in) outside of the 30.48-cm (12-in) walkway width.  3.    GAIT WITH HORIZONTAL HEAD TURNS Instructions: Walk from here to the next mark 6 m (20 ft) away. Begin walking at your normal pace. Keep walking straight; after 3 steps, turn your head to the right and keep walking straight while looking to the right. After 3 more steps, turn your head to the left and keep walking straight while looking left. Continue alternating looking right and left. (3) Normal - Performs head turns smoothly with no change in gait. Deviates no more than 15.24 cm (6 in) outside 30.48-cm (12-in) walkway width.  4.   GAIT WITH VERTICAL HEAD TURNS Instructions: Walk from here to the next mark (6 m [20 ft]). Begin walking at your normal pace. Keep walking straight; after 3 steps, tip your head up and keep walking straight while looking up. After 3 more steps, tip your head down, keep walking straight while looking down. Continue  alternating looking up and down every 3 steps until you have completed 2 repetitions in each direction. (3) Normal - Performs head turns with no change in gait. Deviates no more than 15.24 cm (6 in) outside 30.48-cm (12-in) walkway width.  5.  GAIT AND PIVOT TURN Instructions: Begin with walking at your normal pace. When I tell you, "turn and stop," turn as quickly as you can to face the opposite  direction and stop. (3) Normal - Pivot turns safely within 3 seconds and stops quickly with no loss of balance  6.   STEP OVER OBSTACLE Instructions: Begin walking at your normal speed. When you come to the shoe box, step over it, not around it, and keep walking. (3) Normal - Is able to step over 2 stacked shoe boxes taped together (22.86 cm [9 in] total height) without changing gait speed; no evidence of imbalance.  7.   GAIT WITH NARROW BASE OF SUPPORT Instructions: Walk on the floor with arms folded across the chest, feet  aligned heel to toe in tandem for a distance of 3.6 m [12 ft]. The number of steps taken in a straight line are counted for a maximum of 10 steps. (3) Normal - Is able to ambulate for 10 steps heel to toe with no staggering.  8.   GAIT WITH EYES CLOSED Instructions: Walk at your normal speed from here to the next mark (6 m [20 ft]) with your eyes closed. (2) Mild impairment - Walks 6 m (20 ft), uses assistive device, slower speed, mild gait deviations, deviates 15.24 -25.4 cm (6 -10 in) outside 30.48-cm (12-in) walkway width. Ambulates 6 m (20 ft) in less than 9 seconds but greater than 7 seconds 5.56 seconds; veers mildly to R  9.   AMBULATING BACKWARDS Instructions: Walk backwards until I tell you to stop (3) Normal - Walks 6 m (20 ft), no assistive devices, good speed, no evidence for imbalance, normal gait pattern, deviates no more than 15.24 cm (6 in) outside 30.48-cm (12-in) walkway width. 8.22 seconds  10. STEPS Instructions: Walk up these stairs as you would at home (ie, using the rail if necessary). At the top turn around and walk down. (2) Mild impairment-Alternating feet, must use rail.  Total 28/30   Interpretation of scores: Non-Specific Older Adults Cutoff Score: <=22/30 = risk of falls Parkinson's Disease Cutoff score <15/30= fall risk (Hoehn & Yahr 1-4)  Minimally Clinically Important Difference (MCID)  Stroke (acute, subacute, and chronic) = MDC: 4.2 points Vestibular (acute) = MDC: 6 points Community Dwelling Older Adults =  MCID: 4 points Parkinson's Disease  =  MDC: 4.3 points  (Academy of Neurologic Physical Therapy (nd). Functional Gait Assessment. Retrieved from https://www.neuropt.org/docs/default-source/cpgs/core-outcome-measures/function-gait-assessment-pocket-guide-proof9-(2).pdf?sfvrsn=b67f35043_0.)  PATIENT SURVEYS:  TBA                                                                                                                              TREATMENT DATE:  03/09/24  Therapeutic Exercise: Reviewed HEP Updated HEP: Single Leg Stance on Airex x30 second hold B LE's (no UE support) Standing (feet shoulder width apart on Airex) Stance with Head Rotation (eyes closed) x10 reps; no UE support; SBA Standing (feet shoulder width apart on Airex) Stance with Head Nods (eyes closed) x10 reps no UE support; SBA Notes: head rotation R/L more challenging than head nods up/down with eyes closed (pt more cautious with  head rotation) Foot Taps on Steps of 3 Various Heights (4, 6, and 8 inch) and Placements: x5 reps B LE's (no ankle weights); x10 reps B LE's (2# ankle weights); SBA  PATIENT EDUCATION: Education details: Reviewed and updated HEP.  Issued written handout.  Discharge from PT today. Person educated: Patient Education method: Explanation, Demonstration, Verbal cues, and Handouts Education comprehension: verbalized understanding and returned demonstration  HOME EXERCISE PROGRAM: Access Code: [email or web address] URL: [email or web address] Date: 03/09/2024 Prepared by: Damien Caulk  Exercises - Romberg Stance with Eyes Closed  - 1 x daily - 5 x weekly - 1 sets - 3 reps - 30 seconds hold - Foam Balance Tandem stance  - 1 x daily - 5 x weekly - 1 sets - 3 reps - 30 second hold - Tandem Stance  - 1 x daily - 5 x weekly - 3 sets - 3 reps - 30 second hold - Single Leg Stance on Foam Pad  - 1 x daily - 5 x weekly - 3 sets - 1 reps - up to 30 second hold - Romberg Stance with Head Rotation  - 1 x daily - 5 x weekly - 3 sets - 10 reps - Romberg Stance with Head Nods  - 1 x daily - 5 x weekly - 3 sets - 10 reps - Foot Taps on Steps of Various Heights and Placements (BKA)  - 1 x daily - 5 x weekly - 2-3 sets - 10 reps  GOALS: Goals reviewed with patient? Yes  LONG TERM GOALS: Target date: 03/09/2024 (STG = LTG d/t length of POC)  Pt will be independent with HEP in order to improve strength and balance in order to decrease fall risk and improve  function at home for ADL's. Baseline: Issued initial HEP 02/01/24 Goal status: MET  2.  Patient will deny any falls over past 2 weeks to demonstrate improved safety awareness at home and community. Baseline: h/o fall in Sept and intermittent tripping Goal status: MET  3.  Pt will increase by at least 0.13 m/s in order to demonstrate clinically significant improvement in community ambulation.  Baseline: 1.07 m/second on Eval; 1.32 m/sec 02/29/24 Goal status: MET  4.  Pt will report improved foot clearance (decreased reports of tripping) for safety with gait. Baseline:  intermittent tripping Goal status: PARTIALLY MET (less time she catches her foot)  ASSESSMENT:  CLINICAL IMPRESSION: Patient was seen today for physical therapy treatment to address final HEP and discharge from PT.  Focused session on reviewing HEP and updating HEP.  Patient reports occasionally catching feet when walking (when tired or distracted) but pt reporting now having better awareness of these situations and when she needs to rest.  Pt demonstrates appropriate understanding of finalized updated HEP.  Pt reports being pleased with current status and updated HEP and is ready for discharge from therapy today.  Pt discharged from OP PT today; pt in agreement.  OBJECTIVE IMPAIRMENTS: Abnormal gait, decreased balance, decreased knowledge of condition, decreased knowledge of use of DME, difficulty walking, and pain.   ACTIVITY LIMITATIONS: stairs and locomotion level  PARTICIPATION LIMITATIONS: cleaning, laundry, shopping, community activity, occupation, and yard work  PERSONAL FACTORS: Past/current experiences and 1 comorbidity: R ankle fx s/p sx are also affecting patient's functional outcome.   REHAB POTENTIAL: Good  CLINICAL DECISION MAKING: Stable/uncomplicated  EVALUATION COMPLEXITY: Low  PLAN:  PT FREQUENCY: 1x/week  PT DURATION: 4 weeks  PLANNED INTERVENTIONS: [id number]- PT Re-evaluation, 97750-  Physical Performance Testing, 97110-Therapeutic exercises,  02469- Therapeutic activity, [id number]- Neuromuscular re-education, [id number]- Self Care, [id number]- Manual therapy, [id number]- Gait training, [phone]- Orthotic Initial, [phone]- Orthotic/Prosthetic subsequent, [phone]- Aquatic Therapy, [phone]- Electrical stimulation (manual), [id number]- Ultrasound, Patient/Family education, Balance training, Stair training, Taping, Joint mobilization, Spinal mobilization, Vestibular training, DME instructions, Cryotherapy, and Moist heat  PLAN FOR NEXT SESSION: Discharge from PT 03/09/24  Damien Caulk, PT 03/09/2024, 4:25 PM

## 2024-03-16 ENCOUNTER — Ambulatory Visit

## 2024-03-16 VITALS — BP 132/84 | HR 73 | Temp 97.4°F | Resp 18 | Ht 64.0 in | Wt 188.6 lb

## 2024-03-16 DIAGNOSIS — M810 Age-related osteoporosis without current pathological fracture: Secondary | ICD-10-CM | POA: Diagnosis not present

## 2024-03-16 MED ORDER — ROMOSOZUMAB-AQQG 105 MG/1.17ML ~~LOC~~ SOSY
210.0000 mg | PREFILLED_SYRINGE | Freq: Once | SUBCUTANEOUS | Status: AC
  Administered 2024-03-16: 210 mg via SUBCUTANEOUS
  Filled 2024-03-16: qty 2.34

## 2024-03-16 NOTE — Progress Notes (Signed)
 Diagnosis: , Osteoporosis  Provider:  Mannam, Praveen MD  Procedure: Injection  Evenity  (Romosozumab -aqqg), Dose: 210 mg, Site: subcutaneous, Number of injections: 2  Injection Site(s): Left lower quad. abdomen and Right lower quad. abdomne  Post Care:    Discharge: Condition: Good, Destination: Home . AVS Declined  Performed by:  Diaz Crago, RN

## 2024-03-26 ENCOUNTER — Telehealth: Payer: Self-pay | Admitting: Pharmacy Technician

## 2024-03-26 NOTE — Telephone Encounter (Signed)
 Auth Submission: NO AUTH NEEDED Site of care: Site of care: [facility] Payer: [employer] UMR Medication & CPT/J Code(s) submitted: Evenity  (Romosozumab ) [id number] Diagnosis Code:  Route of submission (phone, fax, portal): PORTAL Phone # Fax # Auth type: Buy/Bill PB Units/visits requested: 210MG  [id number]  Reference number: [id number]  Submitted auth via portal and it was voided due to no auth needed.  Called and spoke w/rep to verify no auth was needed. Rep: Kurt-H Approval from: 03/26/24 to 10/03/24

## 2024-03-27 ENCOUNTER — Encounter: Payer: Self-pay | Admitting: Obstetrics and Gynecology

## 2024-03-27 NOTE — Addendum Note (Signed)
 Addended by: DAYNE SHERRY RAMAN on: 03/27/2024 11:21 AM   Modules accepted: Orders

## 2024-04-13 ENCOUNTER — Ambulatory Visit

## 2024-04-13 VITALS — BP 146/79 | HR 74 | Temp 97.6°F | Resp 16 | Ht 64.0 in | Wt 183.6 lb

## 2024-04-13 DIAGNOSIS — M810 Age-related osteoporosis without current pathological fracture: Secondary | ICD-10-CM | POA: Diagnosis not present

## 2024-04-13 MED ORDER — ROMOSOZUMAB-AQQG 105 MG/1.17ML ~~LOC~~ SOSY
210.0000 mg | PREFILLED_SYRINGE | Freq: Once | SUBCUTANEOUS | Status: AC
  Administered 2024-04-13: 210 mg via SUBCUTANEOUS

## 2024-04-13 NOTE — Progress Notes (Signed)
 Diagnosis: Osteoporosis  Provider:  Mannam, Praveen MD  Procedure: Injection  Evenity  (Romosozumab -aqqg), Dose: 210 mg, Site: subcutaneous, Number of injections: 2  Injection Site(s): Left lower quad. abdomen and Right lower quad. abdomne  Post Care: Patient declined observation  Discharge: Condition: Good, Destination: Home . AVS Declined  Performed by:  Star East, LPN

## 2024-05-11 ENCOUNTER — Ambulatory Visit

## 2024-05-11 VITALS — BP 148/84 | HR 76 | Temp 97.7°F | Resp 16 | Ht 64.0 in | Wt 190.4 lb

## 2024-05-11 DIAGNOSIS — M810 Age-related osteoporosis without current pathological fracture: Secondary | ICD-10-CM

## 2024-05-11 MED ORDER — ROMOSOZUMAB-AQQG 105 MG/1.17ML ~~LOC~~ SOSY
210.0000 mg | PREFILLED_SYRINGE | Freq: Once | SUBCUTANEOUS | Status: AC
  Administered 2024-05-11: 210 mg via SUBCUTANEOUS

## 2024-05-11 NOTE — Progress Notes (Signed)
 Diagnosis: Osteoporosis  Provider:  Mannam, Praveen MD  Procedure: Injection  Evenity  (Romosozumab -aqqg), Dose: 210 mg, Site: subcutaneous, Number of injections: 2  Injection Site(s): Left lower quad. abdomen and Right lower quad. abdomne  Post Care: Patient declined observation  Discharge: Condition: Good, Destination: Home . AVS Declined  Performed by:  Star East, LPN

## 2024-06-08 ENCOUNTER — Ambulatory Visit

## 2025-02-18 ENCOUNTER — Inpatient Hospital Stay: Admitting: Hematology and Oncology
